# Patient Record
Sex: Male | Born: 1935 | Race: White | Hispanic: No | Smoking: Former smoker
Health system: Southern US, Community
[De-identification: ages and names within clinical notes are randomized; demographics above are authoritative.]

## PROBLEM LIST (undated history)

## (undated) DIAGNOSIS — I2510 Atherosclerotic heart disease of native coronary artery without angina pectoris: Secondary | ICD-10-CM

## (undated) DIAGNOSIS — D696 Thrombocytopenia, unspecified: Secondary | ICD-10-CM

## (undated) DIAGNOSIS — N390 Urinary tract infection, site not specified: Secondary | ICD-10-CM

## (undated) DIAGNOSIS — R6521 Severe sepsis with septic shock: Secondary | ICD-10-CM

## (undated) DIAGNOSIS — N189 Chronic kidney disease, unspecified: Secondary | ICD-10-CM

## (undated) DIAGNOSIS — N1330 Unspecified hydronephrosis: Secondary | ICD-10-CM

## (undated) DIAGNOSIS — I4891 Unspecified atrial fibrillation: Secondary | ICD-10-CM

## (undated) DIAGNOSIS — R55 Syncope and collapse: Secondary | ICD-10-CM

## (undated) DIAGNOSIS — J9690 Respiratory failure, unspecified, unspecified whether with hypoxia or hypercapnia: Secondary | ICD-10-CM

## (undated) DIAGNOSIS — G4730 Sleep apnea, unspecified: Secondary | ICD-10-CM

## (undated) DIAGNOSIS — E119 Type 2 diabetes mellitus without complications: Secondary | ICD-10-CM

## (undated) DIAGNOSIS — I10 Essential (primary) hypertension: Secondary | ICD-10-CM

## (undated) DIAGNOSIS — E782 Mixed hyperlipidemia: Secondary | ICD-10-CM

## (undated) HISTORY — PX: BACK SURGERY: SHX140

## (undated) HISTORY — PX: TONSILLECTOMY: SUR1361

## (undated) HISTORY — DX: Mixed hyperlipidemia: E78.2

## (undated) HISTORY — DX: Atherosclerotic heart disease of native coronary artery without angina pectoris: I25.10

## (undated) HISTORY — DX: Essential (primary) hypertension: I10

## (undated) HISTORY — PX: KNEE ARTHROSCOPY: SUR90

## (undated) HISTORY — PX: HERNIA REPAIR: SHX51

## (undated) HISTORY — PX: CHOLECYSTECTOMY: SHX55

## (undated) HISTORY — DX: Type 2 diabetes mellitus without complications: E11.9

---

## 1990-11-28 HISTORY — PX: CORONARY ARTERY BYPASS GRAFT: SHX141

## 2005-08-16 ENCOUNTER — Ambulatory Visit: Payer: Self-pay | Admitting: Cardiology

## 2005-09-03 ENCOUNTER — Emergency Department (HOSPITAL_COMMUNITY): Admission: EM | Admit: 2005-09-03 | Discharge: 2005-09-03 | Payer: Self-pay | Admitting: Emergency Medicine

## 2005-09-27 ENCOUNTER — Ambulatory Visit: Payer: Self-pay | Admitting: Cardiology

## 2005-09-27 ENCOUNTER — Inpatient Hospital Stay (HOSPITAL_BASED_OUTPATIENT_CLINIC_OR_DEPARTMENT_OTHER): Admission: RE | Admit: 2005-09-27 | Discharge: 2005-09-27 | Payer: Self-pay | Admitting: Cardiology

## 2005-10-14 ENCOUNTER — Ambulatory Visit: Payer: Self-pay | Admitting: Cardiology

## 2006-03-23 ENCOUNTER — Ambulatory Visit: Payer: Self-pay | Admitting: Orthopedic Surgery

## 2006-08-17 ENCOUNTER — Ambulatory Visit: Payer: Self-pay | Admitting: Cardiology

## 2006-09-05 ENCOUNTER — Encounter: Payer: Self-pay | Admitting: Orthopedic Surgery

## 2006-09-05 ENCOUNTER — Inpatient Hospital Stay (HOSPITAL_COMMUNITY): Admission: RE | Admit: 2006-09-05 | Discharge: 2006-09-06 | Payer: Self-pay | Admitting: Neurosurgery

## 2006-10-05 ENCOUNTER — Ambulatory Visit: Payer: Self-pay | Admitting: Orthopedic Surgery

## 2006-10-12 ENCOUNTER — Ambulatory Visit: Payer: Self-pay | Admitting: Orthopedic Surgery

## 2007-03-05 ENCOUNTER — Encounter: Payer: Self-pay | Admitting: Orthopedic Surgery

## 2007-03-05 ENCOUNTER — Encounter: Admission: RE | Admit: 2007-03-05 | Discharge: 2007-03-05 | Payer: Self-pay | Admitting: Neurosurgery

## 2007-03-16 ENCOUNTER — Encounter: Payer: Self-pay | Admitting: Orthopedic Surgery

## 2007-03-16 ENCOUNTER — Inpatient Hospital Stay (HOSPITAL_COMMUNITY): Admission: RE | Admit: 2007-03-16 | Discharge: 2007-03-20 | Payer: Self-pay | Admitting: Neurosurgery

## 2007-03-20 ENCOUNTER — Inpatient Hospital Stay: Admission: AD | Admit: 2007-03-20 | Discharge: 2007-04-03 | Payer: Self-pay | Admitting: Internal Medicine

## 2007-06-05 ENCOUNTER — Ambulatory Visit: Payer: Self-pay | Admitting: Orthopedic Surgery

## 2007-07-16 ENCOUNTER — Ambulatory Visit: Payer: Self-pay | Admitting: Cardiology

## 2007-07-16 ENCOUNTER — Ambulatory Visit: Payer: Self-pay | Admitting: Cardiovascular Disease

## 2007-07-17 ENCOUNTER — Ambulatory Visit (HOSPITAL_COMMUNITY): Admission: RE | Admit: 2007-07-17 | Discharge: 2007-07-17 | Payer: Self-pay | Admitting: Cardiology

## 2007-10-05 ENCOUNTER — Ambulatory Visit: Payer: Self-pay | Admitting: Cardiology

## 2008-06-10 DIAGNOSIS — E118 Type 2 diabetes mellitus with unspecified complications: Secondary | ICD-10-CM

## 2008-06-11 ENCOUNTER — Ambulatory Visit: Payer: Self-pay | Admitting: Orthopedic Surgery

## 2008-06-11 DIAGNOSIS — IMO0002 Reserved for concepts with insufficient information to code with codable children: Secondary | ICD-10-CM | POA: Insufficient documentation

## 2008-06-11 DIAGNOSIS — M23302 Other meniscus derangements, unspecified lateral meniscus, unspecified knee: Secondary | ICD-10-CM | POA: Insufficient documentation

## 2008-06-11 DIAGNOSIS — M25569 Pain in unspecified knee: Secondary | ICD-10-CM

## 2008-06-11 DIAGNOSIS — M1710 Unilateral primary osteoarthritis, unspecified knee: Secondary | ICD-10-CM

## 2008-06-13 ENCOUNTER — Telehealth: Payer: Self-pay | Admitting: Orthopedic Surgery

## 2008-06-18 ENCOUNTER — Ambulatory Visit (HOSPITAL_COMMUNITY): Admission: RE | Admit: 2008-06-18 | Discharge: 2008-06-18 | Payer: Self-pay | Admitting: Orthopedic Surgery

## 2008-06-23 ENCOUNTER — Ambulatory Visit: Payer: Self-pay | Admitting: Orthopedic Surgery

## 2008-06-27 ENCOUNTER — Ambulatory Visit: Payer: Self-pay | Admitting: Orthopedic Surgery

## 2008-06-27 ENCOUNTER — Ambulatory Visit (HOSPITAL_COMMUNITY): Admission: RE | Admit: 2008-06-27 | Discharge: 2008-06-27 | Payer: Self-pay | Admitting: Orthopedic Surgery

## 2008-06-30 ENCOUNTER — Encounter (HOSPITAL_COMMUNITY): Admission: RE | Admit: 2008-06-30 | Discharge: 2008-07-30 | Payer: Self-pay | Admitting: Orthopedic Surgery

## 2008-07-07 ENCOUNTER — Ambulatory Visit: Payer: Self-pay | Admitting: Orthopedic Surgery

## 2008-07-28 ENCOUNTER — Ambulatory Visit: Payer: Self-pay | Admitting: Orthopedic Surgery

## 2008-07-28 DIAGNOSIS — M5430 Sciatica, unspecified side: Secondary | ICD-10-CM | POA: Insufficient documentation

## 2008-09-15 ENCOUNTER — Encounter (HOSPITAL_COMMUNITY): Admission: RE | Admit: 2008-09-15 | Discharge: 2008-10-15 | Payer: Self-pay | Admitting: Podiatry

## 2008-09-30 ENCOUNTER — Ambulatory Visit: Payer: Self-pay | Admitting: Cardiology

## 2008-10-31 ENCOUNTER — Ambulatory Visit: Payer: Self-pay | Admitting: Cardiology

## 2008-10-31 DIAGNOSIS — I482 Chronic atrial fibrillation, unspecified: Secondary | ICD-10-CM

## 2008-10-31 DIAGNOSIS — I2510 Atherosclerotic heart disease of native coronary artery without angina pectoris: Secondary | ICD-10-CM

## 2008-12-10 ENCOUNTER — Encounter: Payer: Self-pay | Admitting: Orthopedic Surgery

## 2009-02-06 ENCOUNTER — Ambulatory Visit: Payer: Self-pay | Admitting: Cardiology

## 2010-02-15 ENCOUNTER — Ambulatory Visit: Payer: Self-pay | Admitting: Orthopedic Surgery

## 2010-02-15 ENCOUNTER — Telehealth: Payer: Self-pay | Admitting: Orthopedic Surgery

## 2010-02-15 DIAGNOSIS — M4800 Spinal stenosis, site unspecified: Secondary | ICD-10-CM

## 2010-02-16 ENCOUNTER — Telehealth: Payer: Self-pay | Admitting: Orthopedic Surgery

## 2010-03-08 ENCOUNTER — Encounter: Payer: Self-pay | Admitting: Orthopedic Surgery

## 2010-03-11 ENCOUNTER — Ambulatory Visit (HOSPITAL_COMMUNITY): Admission: RE | Admit: 2010-03-11 | Discharge: 2010-03-11 | Payer: Self-pay | Admitting: Neurosurgery

## 2010-03-29 ENCOUNTER — Encounter: Payer: Self-pay | Admitting: Orthopedic Surgery

## 2010-04-14 ENCOUNTER — Telehealth (INDEPENDENT_AMBULATORY_CARE_PROVIDER_SITE_OTHER): Payer: Self-pay | Admitting: *Deleted

## 2010-08-18 ENCOUNTER — Encounter: Payer: Self-pay | Admitting: Orthopedic Surgery

## 2010-12-28 NOTE — Op Note (Signed)
Summary: Redo Laminectomy L4-5 by Dr. Venetia Maxon  Redo Laminectomy L4-5 by Dr. Venetia Maxon   Imported By: Jacklynn Ganong 02/23/2010 14:28:59  _____________________________________________________________________  External Attachment:    Type:   Image     Comment:   External Document

## 2010-12-28 NOTE — Consult Note (Signed)
Summary: Consult Report from Dr. Imelda Pillow  Consult Report from Dr. Imelda Pillow   Imported By: Jacklynn Ganong 04/06/2010 09:51:42  _____________________________________________________________________  External Attachment:    Type:   Image     Comment:   External Document

## 2010-12-28 NOTE — Progress Notes (Signed)
   All Cardiac faxed over to [facility]  [facility]  Apr 14, 2010 3:20 PM

## 2010-12-28 NOTE — Assessment & Plan Note (Signed)
Summary: lft leg pain may need xr/medicare/bsf   Visit Type:  Follow-up  CC:  bilateral leg pain.Marland Kitchen  History of Present Illness: 77 year old male status post 2 lumbar surgeries by Dr. Venetia Maxon at [facility] presents now with bilateral anterior thigh pain and inability to walk difficulty getting out of a chair and trouble walking any distance.   review of systems he indicates no bowel or bladder dysfunction at this time  I took some x-rays of his hips and pelvis as noted in the office  The x-rays show a normal hip joints no arthritis normal pelvis  Impression bilateral thigh pain most likely from spinal stenosis  Recommend neurosurgical reevaluation I did give him some Norco for pain follow up as needed  Allergies: 1)  ! * Tylenol #3   Impression & Recommendations:  Problem # 1:  SPINAL STENOSIS (ICD-724.00) Assessment Deteriorated  AP pelvis and bilateral frog-leg laterals of the hips  There is normal joint space on both hips no evidence of arthritis  Impression normal hip joints  Orders: Est. Patient Level III (04540) Pelvis x-ray, 1/2 views (98119) Hip x-ray unilateral complete, minimum 2 views (14782)  Medications Added to Medication List This Visit: 1)  Norco  .... 1 q 4 as needed pain  Other Orders: Neurosurgeon Referral (Neurosurgeon)  Patient Instructions: 1)  Dr. Venetia Maxon did  previous surgery but he request another doctor in the same group  2)  Please schedule a follow-up appointment as needed. Prescriptions: NORCO 1 q 4 as needed pain  #42 x 1   Entered and Authorized by:   Fuller Canada MD   Signed by:   Fuller Canada MD on 02/15/2010   Method used:   Print then Give to Patient   RxID:   [id number]

## 2010-12-28 NOTE — Progress Notes (Signed)
Summary: OK to schedule with Dr. Venetia Maxon  Phone Note Call from Patient   Summary of Call: [name] (2018-05-28) left a message to go ahead and schedule him with Dr. Venetia Maxon.   His # [id number] Initial call taken by: Jacklynn Ganong,  February 15, 2010 1:16 PM

## 2010-12-28 NOTE — Letter (Signed)
Summary: [facility] office notes Dr Leitha Bleak office notes Dr Venetia Maxon   Imported By: Cammie Sickle 03/30/2010 10:09:56  _____________________________________________________________________  External Attachment:    Type:   Image     Comment:   External Document

## 2010-12-28 NOTE — Progress Notes (Signed)
Summary: Referral to Dr. Venetia Maxon.  Phone Note Outgoing Call   Call placed by: Waldon Reining,  February 16, 2010 11:20 AM Call placed to: Specialist Action Taken: Information Sent Summary of Call: I faxed a referral for this patient to be seen by Dr. Venetia Maxon for his leg pain.

## 2010-12-28 NOTE — Medication Information (Signed)
Summary: RX Folder Medication List  RX Folder Medication List   Imported By: Cammie Sickle 02/15/2010 11:11:51  _____________________________________________________________________  External Attachment:    Type:   Image     Comment:   External Document

## 2010-12-28 NOTE — Medication Information (Signed)
Summary: Diabetic shoe Rx  Diabetic shoe Rx   Imported By: Cammie Sickle 02/15/2010 12:30:23  _____________________________________________________________________  External Attachment:    Type:   Image     Comment:   External Document

## 2010-12-28 NOTE — Op Note (Signed)
Summary: Previous  surgery note L4-5 foramniotomy with microdissection by  Previous  surgery note L4-5 foramniotomy with microdissection by Dr. Venetia Maxon   Imported By: Jacklynn Ganong 02/23/2010 14:30:41  _____________________________________________________________________  External Attachment:    Type:   Image     Comment:   External Document

## 2010-12-28 NOTE — Letter (Signed)
Summary: Medical record request fax Dr Eduard Clos  Medical record request fax Dr Eduard Clos   Imported By: Cammie Sickle 08/24/2010 21:24:18  _____________________________________________________________________  External Attachment:    Type:   Image     Comment:   External Document

## 2011-02-16 LAB — CREATININE, SERUM: GFR calc Af Amer: >60 mL/min (ref >60)

## 2011-04-12 NOTE — Assessment & Plan Note (Signed)
[facility]                          [place] [facility] OFFICE NOTE   NAME:Hosang, CHOSEN GESKE                      MRN:          [id number]  DATE:09/30/2008                            DOB:          1918-02-20    PRIMARY CARE PHYSICIAN:  Catalina Pizza, MD.   REASON FOR CONSULTATION:  Dysrhythmia and bradycardia.   HISTORY OF PRESENT ILLNESS:  I last saw Mr. Bluitt in the office back in  November 2008.  He has a history of multivessel cardiovascular disease,  status post coronary artery bypass grafting in 1992 with patent bypass  grafts at angiography in 2006 and preserved left ventricular systolic  function.  He has no history of cardiac dysrhythmia.  Recently, he  underwent a toe amputation at [facility] and was noted  to have a slow irregular heartbeat during perioperative monitoring.  An  electrocardiogram was obtained and reviewed by Dr. Myrtis Ser with concerns  about possible sinus bradycardia versus atrial fibrillation and heart  rate of 49 beats per minute.  It was recommended that the patient stop  his atenolol and see me in the office.  Mr. Panepinto presents today  stating that he has felt fine.  He has not had any progressive  shortness of breath, sense of palpitations, chest pain, or syncope.  He  has stopped his atenolol.  Heart rate in the clinic is ranging between  the 80s to the 90s in what is noted to be atrial fibrillation by a  followup electrocardiogram.  He has a chronic left axis deviation and  right bundle-branch block pattern as well with nonspecific ST-T wave  changes.  Mr. Magallon has a CHADS2 score of 2-3.  He, however, ambulates  with a cane and has poor balance being prone to falls and injury.  Today, we talked about Coumadin, but elected to treat with full-dose  aspirin.  I also suggested that he continue to stay off of the atenolol  and that we would try pindolol instead.   ALLERGIES:  PENICILLIN and TYLENOL #3.   PRESENT MEDICATIONS:  1. Januvia 100 mg p.o. daily.  2. Avapro 75 mg p.o. daily.  3. Metformin 500 mg 2 tablets p.o. q.a.m. and 1 tablet p.o. q.p.m.  4. Aspirin 81 mg p.o. daily.  5. Ocuvite daily.  6. Advil p.r.n.   REVIEW OF SYSTEMS:  As described in the history of present illness.  Otherwise negative.   PHYSICAL EXAMINATION:  VITAL SIGNS:  Blood pressure is 128/79, heart  rate is 80-90 in atrial fibrillation, weight 294 pounds.  GENERAL:  The patient is comfortable in no acute distress.  HEENT:  Conjunctiva is normal.  Oropharynx is clear.  NECK:  Supple.  No elevated jugular venous pressure.  No audible bruits.  No thyromegaly is noted.  LUNGS: Clear without labored breathing at rest.  CARDIAC:  Irregularly irregular rhythm with soft systolic murmur.  No  pericardial rub or S3 gallop.  ABDOMEN:  Soft and nontender.  Normoactive bowel sounds.  EXTREMITIES:  Mild nonpitting edema.  Decreased pulses.  He is wearing a  soft brace on his  foot, status post second toe amputation.   IMPRESSION AND RECOMMENDATIONS:  Recently diagnosed atrial fibrillation,  of uncertain duration.  Mr. Hemp was largely asymptomatic without any  frank sense of palpitations, limiting dyspnea, or chest pain.  He was  rather bradycardic in the perioperative setting, although heart rate has  come up off atenolol.  I would like for him to stay off atenolol.  We  will try pindolol 5 mg p.o. b.i.d.  We did discuss Coumadin with a  CHADS2 score of 2-3, although his fall risk is concerning and we will  therefore suggest enteric-coated aspirin 325 mg daily instead.  I will  plan to see him back over the next month to reassess heart rate control  and symptoms and we can proceed from there.     Jonelle Sidle, MD  Electronically Signed    SGM/MedQ  DD: 09/30/2008  DT: 10/01/2008  Job #: [id number]   cc:   Catalina Pizza, M.D.

## 2011-04-12 NOTE — Assessment & Plan Note (Signed)
[facility]                       [place] [facility] OFFICE NOTE   Kyle, [name] [name]                      MRN:          [id number]  DATE:10/31/2008                            DOB:          12-18-1917    PRIMARY CARE PHYSICIAN:  Catalina Pizza, MD   REASON FOR VISIT:  Routine followup.   HISTORY OF PRESENT ILLNESS:  I saw [name] back in November.  We  adjusted his medications at that time with recently documented atrial  fibrillation associated with slow ventricular response.  He was taken  off of atenolol and started on pindolol 5 mg p.o. b.i.d.  Heart rate is  in the 60s today, and he denies having any problems with dizziness,  syncope, or palpitations.  He has undergone some blood pressure medicine  adjustments and is now on Benicar, but was interested in perhaps taking  a generic medication if possible.  He also states that he increased his  aspirin to 325 mg daily as we recommended, although had some nosebleeds  and has had to cut this back to 81 mg daily.  His CHADS2 score is 2-3.  We have discussed Coumadin, although held off given problems of poor  balance and being prone to falls and injury.  He is not having any  anginal chest pain and his breathing is stable.   ALLERGIES:  PENICILLIN and TYLENOL NO. 3.   PRESENT MEDICATIONS:  1. Pindolol 5 mg p.o. b.i.d.  2. Benicar 20 mg p.o. daily.  3. Januvia 100 mg p.o. daily.  4. Metformin 500 mg p.o. t.i.d.  5. Aspirin 81 mg p.o. daily.  6. Ocuvite daily.  7. Advil p.r.n.  8. Hydrocodone p.r.n.   REVIEW OF SYSTEMS:  As outlined above.  Otherwise negative.   PHYSICAL EXAMINATION:  VITAL SIGNS:  Blood pressure is 140/70, heart  rate is 68 and irregular, and weight is 195 pounds.  GENERAL:  The patient is in no acute distress.  NECK:  No elevated jugular venous pressure.  No loud bruits.  No  thyromegaly is noted.  LUNGS: Clear without labored breathing at rest.  Diminished breath  sounds noted.  CARDIAC:  Regularly irregular rhythm.  No S3 gallop.  Soft systolic  murmur noted at the base.  EXTREMITIES:  Exhibit mild nonpitting edema.   IMPRESSION AND RECOMMENDATIONS:  1. Atrial fibrillation with reasonable rate control and no sense of      palpitations.  He seems to be tolerating pindolol better than      atenolol from the perspective of previously documented bradycardia,      and we will plan to continue on aspirin 81 mg daily given his      nosebleeds on higher dose.  We have kept him off Coumadin given his      balance problems and risk for falls.  I will plan to see him back      over the next 3 months.  2. Hypertension, undergoing medicine adjustments by Dr. Margo [name].  Mr.      [name] is interested in taking a generic if possible.  He is due to  see Dr. Margo [name] back for followup and he could be considered for a      generic ACE inhibitor assuming he tolerates the Benicar.  3. Multivessel coronary artery disease with preserved ejection      fraction status post coronary artery bypass grafting in 1992.      Grafts were patent and angiography in 2006, and he is not reporting      any angina.     Jonelle Sidle, MD  Electronically Signed    SGM/MedQ  DD: 10/31/2008  DT: 11/01/2008  Job #: [id number]   cc:   Catalina Pizza, M.D.

## 2011-04-12 NOTE — Procedures (Signed)
NAMETRAVANTE, KNEE NO.:  [id number]   MEDICAL RECORD NO.:  [id number]          PATIENT TYPE:  OUT   LOCATION:  RAD                           FACILITY:  [facility]   PHYSICIAN:  Noralyn Pick. Eden Emms, MD, FACCDATE OF BIRTH:  08-19-1918   DATE OF PROCEDURE:  07/17/2007  DATE OF DISCHARGE:                                ECHOCARDIOGRAM   REASON FOR VISIT:  Check LV function, history of hypertension,  CABG.   Left ventricular cavity size was normal. There was no regional wall  motion abnormality.  There was abnormal septal motion. EF was 60%.  There was mild LVH.  There was moderate left atrial enlargement.  Right-  sided cardiac chambers were normal. There was evidence of mild elevation  in PA systolic pressure.  There was mild TR.  The aortic valve was  slightly sclerotic.  Aortic root was normal.  Mitral valve was mildly  thickened with mild MR and posterior annular calcification.  Subcostal  revealed no septal defect, no source of embolus, no effusion.   M-Mode measurements showed aortic dimension of 4 cm. Left atrial  dimension 56 mm.  Septal thickness 14 mm.  LV diastolic dimension 54 mm.  LV systolic dimension 32 mm.  Peak TR velocity was 3.2 meters per  second.   IMPRESSION:  1. Mild left ventricular hypertrophy,  ejection fraction 60% with      abnormal septal motion.  2. Moderate left atrial enlargement with mild right atrial      enlargement.  3. Mild mitral regurgitation with mitral annular calcification.  4. Aortic valve stenosis.  5. Normal aortic root.  6. No source of embolus.  7. No peripheral effusion.      Noralyn Pick. Eden Emms, MD, [facility]  Electronically Signed     PCN/MEDQ  D:  07/17/2007  T:  07/18/2007  Job:  [id number]   cc:   Jonelle Sidle, MD  [address]  [place], [place] [address]

## 2011-04-12 NOTE — Assessment & Plan Note (Signed)
[place] [facility]                            CARDIOLOGY OFFICE NOTE   NAME:[name], [name]                      MRN:          [id number]  DATE:07/16/2007                            DOB:          May 11, 1918    PRIMARY CARE PHYSICIAN:  Dr. Margo Aye in [place].   REASON FOR VISIT:  Cardiac followup.   HISTORY OF PRESENT ILLNESS:  I saw Mr. Pantano back in September of last  year. He has a history of multi vessel coronary artery disease, status  post coronary artery bypass grafting in 1992. He also has fairly  recently documented patent bypass grafts on angiography in October of  2006 and overall normal left ventricular systolic function. He had a  surgical decompression with redo laminectomy at L4, L5 back in April of  this year. He is not reporting any significant angina. There has been a  question of recent fluid gain however with lower extremity edema and  increased weight. He was placed on Lasix by Dr. Margo Aye and has had a  fairly nice weight loss at least since I have seen him greater than 10  pounds. His electrocardiogram is stable showing a right bundle branch  block pattern and leftward axis.   ALLERGIES:  TYLENOL #3.   PRESENT MEDICATIONS:  1. Relafen 500 mg p.o. b.i.d.  2. Glucophage 500 mg as directed.  3. Atenolol 25 mg p.o. daily.  4. Aspirin 81 mg p.o. daily.  5. Trental 400 mg p.o. daily.  6. Ocuvite.  7. Imipramine 10 mg p.o. daily.  8. Potassium chloride 20 mEq p.o. daily.  9. Lasix 20 mg p.o. daily.   REVIEW OF SYSTEMS:  As described in history of present illness.   PHYSICAL EXAMINATION:  Blood pressure 176/77, heart rate is 61, weight  is 193 pounds down from 212. Elderly male in no acute distress.  HEENT: Conjunctivae normal.  Oropharynx is clear.  NECK: Supple, no elevated jugular venous pressure, or loud bruits. No  thyromegaly is noted.  LUNGS: Clear without labored breathing at rest.  CARDIAC EXAM: Reveals a regular rate and  rhythm. Soft systolic murmur.  No S3 gallop.  EXTREMITIES: Exhibit 1 to 2 + pitting edema around the ankles at the  sock line.   IMPRESSION/RECOMMENDATIONS:  1. Lower extremity edema. This has improved somewhat with Lasix.      Ejection fraction was previously graded at normal based on      assessment in 2006. We will plan a follow up 2D echocardiogram to      reassess this. In the meanwhile I asked him to continue his medical      therapy including Lasix and potassium supplementation. I will see      him back for symptom review in 3 months.  2. Multi vessel coronary artery disease, status post coronary artery      bypass grafting in 1992 with patent bypass grafts at angiography in      2006.     Jonelle Sidle, MD  Electronically Signed    SGM/MedQ  DD: 07/16/2007  DT: 07/16/2007  Job #: [id number]  cc:   Catalina Pizza, M.D.

## 2011-04-12 NOTE — Assessment & Plan Note (Signed)
[place] [facility]                            CARDIOLOGY OFFICE NOTE   NAME:[name], [name]                      MRN:          [id number]  DATE:10/05/2007                            DOB:          May 08, 1918    PRIMARY CARE PHYSICIAN:  Dr. Catalina [name].   REASON FOR VISIT:  Cardiac followup.   HISTORY OF PRESENT ILLNESS:  [name] comes in for a 27-month visit.  He is not reporting any significant angina and states that the previous  lower extremity edema has improved.  He, in fact, is not taking  diuretics at this time.  I did send him for a followup echocardiogram  back in August which revealed a normal left ventricular ejection  fraction of 60%, moderate left atrial enlargement, mild mitral  regurgitation and aortic valve sclerosis.  I reviewed his medications  today.  His electrocardiogram shows sinus rhythm with a prolonged PR  interval and right bundle branch block which is old.   ALLERGIES:  TYLENOL #3.   PRESENT MEDICATIONS:  1. Atenolol 25 mg p.o. daily.  2. Aspirin 81 mg p.o. daily.  3. Ocuvite.  4. Januvia.  5. Advil.  6. Glucophage 500 mg, two tablets p.o. q.a.m. and one tablet p.o.      q.p.m.  7. Diovan 80 mg p.o. daily.   REVIEW OF SYSTEMS:  As described in the History of Present Illness.   EXAMINATION:  Blood pressure today is 114/64, heart rate is 55, weight  is 197 pounds which is stable.  The patient is comfortable, in no acute  distress.  HEENT:  Conjunctiva is normal.  Oropharynx is clear.  NECK:  Supple.  No elevated jugular venous pressure, no loud bruits, no  thyromegaly is noted.  LUNGS:  Clear without labored breathing.  CARDIAC EXAM:  Reveals a regular rate and rhythm without S3 gallop.  EXTREMITIES:  Exhibit trace edema.   IMPRESSION AND RECOMMENDATION:  1. Multivessel coronary artery disease with preserved ejection      fraction status post coronary artery bypass grafting in 1992.      Patent bypass grafts were  noted at angiography in 2006 and the      patient is not reporting any angina at this time.  Plan will be to      continue medical therapy and I will see him back in 6 months in our      [place] office.  2. Prior lower extremity edema has improved.  Will hold off diuretics      for the time being.  3. Otherwise, follow up with Dr. Margo [name] for management of diabetes and      lipids.  The patient is not on a statin at this time.  He should      have aggressive LDL control around 70 if possible.     Jonelle Sidle, MD  Electronically Signed    SGM/MedQ  DD: 10/05/2007  DT: 10/05/2007  Job #: [id number]   cc:   [name], M.D.

## 2011-04-12 NOTE — Op Note (Signed)
NAMEKIEL, COCKERELL NO.:  [id number]   MEDICAL RECORD NO.:  [id number]          PATIENT TYPE:  AMB   LOCATION:  DAY                           FACILITY:  [facility]   PHYSICIAN:  Vickki Hearing, M.D.DATE OF BIRTH:  [birth year]   DATE OF PROCEDURE:  06/27/2008  DATE OF DISCHARGE:                               OPERATIVE REPORT   HISTORY:  This is an 77 year old male with 2-year history of pain in his  right knee and giving out episodes.  MRI showed a large complex tear of  posterior horn and body of the medial meniscus with grade IV arthritic  changes on his patella, severe medial compartment arthritis, Baker cyst,  and loose body medial recess.  He was treated conservatively with  injections, medication Advil and oral pain medicine, did not improve,  presented for arthroscopy of the right knee.   PREOPERATIVE DIAGNOSIS:  Torn medial meniscus of the right knee with  severe arthritis, possible loose body.   POSTOPERATIVE DIAGNOSES:  Torn medial meniscus and severe arthritis,  right knee.   PROCEDURES:  Arthroscopy, right knee and partial medial meniscectomy.   SURGEON:  Vickki Hearing, MD   ASSISTANT:  There were no assistants.   ANESTHETIC:  Spinal.   FINDINGS:  Complex tear posterior horn medial meniscus, grade IV changes  tibial and femoral surfaces of the medial compartment, synovitis  throughout the knee, grade II and III changes in the patellofemoral area  and lateral compartment grade II changes.  No loose body was found.   SPECIMENS:  There were no specimens.   ESTIMATED BLOOD LOSS:  Minimal.   COMPLICATIONS:  None.   Counts were correct.   DESCRIPTION OF PROCEDURE:  The patient was identified in preop area as  [name].  Surgical site was marked.  Surgeons and the patient's  initial history and physical was updated.  Chart review was done.  Antibiotics were given and he was taken to surgery, had a spinal  anesthetic.  He was placed  supine with a right leg holder and padding on  the left leg after prepping with DuraPrep and draping sterilely.  Time-  out procedure was completed.   Lateral portal was established.  Diagnostic arthroscopy was performed.  The findings are listed above.  A medial portal was established.  A  medial meniscus was probed.  On MRI, there was said to be a displaced  fragment below the joint line.  This was sought, but not found.  A  straight shaver was used with a 50-degree ArthroCare wand to resect and  balance the meniscus.  Brief chondroplasty was done on the femoral side.  Slight chondroplasty was done on the lateral side just to clean up  debris.  Knee was irrigated copious amounts of arthroscopic fluid and  then injected with Marcaine 0.25% with epinephrine.  Steri-Strips  applied to the wounds.  Dressings then applied with Ace wrap and Cryo  Cuff.  The patient was taken to recovery room in stable condition.   POSTOP PLAN:  For full weightbearing as tolerated, physical therapy to  start  next week.  Followup appointment is July 07, 2008, at 9:30 a.m.   DISCHARGE MEDICATIONS:  Lorcet Plus 1 q.4 h. p.r.n. for pain #40, one  refill and Phenergan 25 mg p.o. q.6 h. p.r.n. for nausea #30, no refill.      Vickki Hearing, M.D.  Electronically Signed     SEH/MEDQ  D:  06/27/2008  T:  06/27/2008  Job:  [id number]

## 2011-04-12 NOTE — Assessment & Plan Note (Signed)
[facility]                        CARDIOLOGY OFFICE NOTE   NAME:TUTTLEAureliano, Oshields                      MRN:          [id number]  DATE:02/06/2009                            DOB:          10-Sep-1918    PRIMARY CARE PHYSICIAN:  Dr. Catalina Pizza, MD.   REASON FOR VISIT:  Scheduled followup.   HISTORY OF PRESENT ILLNESS:  Mr. Copes presents for a regular visit.  He is not reporting any significant palpitations.  He has had no  dizziness or syncope.  He does state that he was diagnosed with a fairly  significant peripheral neuropathy since we last met, likely explaining a  lot of his unsteadiness and being prone to falls.  We have kept him off  Coumadin for this reason even though his CHADS2 score is 2-3.  Heart  rate control looks better, since we have switched him to pindolol.  He  had labs done back in January which revealed total cholesterol of 140,  HDL of 39, LDL of 80, and triglycerides of 106.   ALLERGIES:  TYLENOL #3 and PENICILLIN.   PRESENT MEDICATIONS:  1. Aspirin 81 mg p.o. daily.  2. Ocuvite daily.  3. Januvia 100 mg p.o. daily.  4. Metformin 1000 mg p.o. q.a.m. and 500 mg p.o. q.p.m.  5. Pindolol 5 mg p.o. b.i.d.  6. Benicar 20 mg p.o. daily.  7. Vitamin B12 daily.   REVIEW OF SYSTEMS:  As outlined above.  Otherwise negative.   PHYSICAL EXAMINATION:  VITAL SIGNS:  Blood pressure 110/68, heart rate  is 66 and irregular, weight is 196 pounds just stable.  GENERAL:  He is an overweight male in no acute distress.  NECK:  No elevated jugular venous pressure.  No loud carotid bruits.  No  thyromegaly.  LUNGS:  Clear without labored breathing at rest.  CARDIAC:  An irregularly irregular rhythm.  No S3 gallop.  Soft basal  systolic murmur.  No pericardial rub.  EXTREMITIES:  Exhibit mild nonpitting edema.   IMPRESSION/RECOMMENDATIONS:  1. Chronic atrial fibrillation, on aspirin and pindolol.  He has been      stable in this  regard with no dizziness, syncope, or major      palpitations.  He has not been on Coumadin given increased fall      risk.  CHADS2 score is 2-3.  2. Multivessel cardiovascular disease with preserved ejection      fraction, status post coronary bypass grafting in 1992.  Grafts are      patent angiography in 2006.  He is not reporting any angina.  3. Hypertension, much better controlled.  4. Hyperlipidemia, with LDL of 80 in January.     Jonelle Sidle, MD  Electronically Signed    SGM/MedQ  DD: 02/06/2009  DT: 02/07/2009  Job #: [id number]   cc:   Catalina Pizza, M.D.

## 2011-04-12 NOTE — Assessment & Plan Note (Signed)
[facility]                          [place] CARDIOLOGY OFFICE NOTE   NAME:[name], Kyle ISHLER                      MRN:          [id number]  DATE:09/24/2008                            DOB:          [birth year]    [name] had a toe amputated today at [facility].  There was no  preop EKG.  During his monitoring for the procedure, it was noted that  his heartbeat was relatively slow and somewhat irregular.  EKG was done  and has been faxed to me.  It is difficult to fully assess.  He has a  history of right bundle-branch block and first-degree AV block.  It is  possible that this could be sinus bradycardia.  It is possible that it  could be atrial fibrillation.  The patient is not having any significant  symptoms.  He has a history of an old right bundle-branch block.   I have reviewed his records by e-chart, and he has history of good LV  function.   I have recommended that his atenolol 25 mg be held.  He will be allowed  to go home from short-stay, and our office will call him for early  followup in the office to address this issue.  He is clinically stable  as reported to me.     Luis Abed, MD, [facility]  Electronically Signed    JDK/MedQ  DD: 09/24/2008  DT: 09/25/2008  Job #: [id number]

## 2011-04-15 NOTE — H&P (Signed)
[name], [name]:  [id number]   MEDICAL RECORD NO.:  [id number]          PATIENT TYPE:  OIB   LOCATION:  1963                         FACILITY:  [facility]   PHYSICIAN:  [name], M.D. [facility] DATE OF BIRTH:  06-27-1918   DATE OF ADMISSION:  09/27/2005  DATE OF DISCHARGE:                                HISTORY & PHYSICAL   PRIMARY CARDIOLOGIST:  Dr. Simona [name]   PRIMARY CARE PHYSICIAN:  Dr. Nena [name]   CHIEF COMPLAINT:  Shortness of breath/dyspnea on exertion.   HISTORY OF PRESENT ILLNESS:  [name] is an 77 year old male with a  history of coronary artery disease who had bypass surgery in 1992.  He  transferred from the [place] to the [place] office in 2003 and had a  Cardiolite at that time which showed a fixed defect but no ischemia and an  EF of 57%.  He saw Dr. Raul [name] and described progressive exertional shortness  of breath and some chest tightness.  An echocardiogram was performed through  Dr. Raul [name] which showed an EF of 50%.  He was evaluated by Dr. Diona [name] and  the situation was discussed with Dr. Juanda [name].  It was recommended that he  have a cardiac catheterization and he is here today for the procedure.   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  He is generally active around the house although he does not  formally exercise.  He has had no chest tightness recently.  He is symptom  free at the time of exam.   PAST MEDICAL HISTORY:  1.  Status post aortocoronary bypass surgery in 1992 with LIMA to diagonal      and LAD, SVG to OM and SVG to RCA.  2.  Hypertension.  3.  Diabetes.  4.  Diabetic neuropathy.  5.  Osteoarthritis.  6.  Hypertension.  7.  Hyperlipidemia.   PAST SURGICAL HISTORY:  Aortocoronary bypass surgery and cardiac  catheterization.   ALLERGIES:  No known drug allergies.   CURRENT MEDICATIONS  INCLUDE:  1.  Coated aspirin 325 mg one half tablet daily.  2.  Lasix 40 mg a day.  3.  Relafen 500 mg twice daily.  4.  Atenolol 25 mg daily.  5.  Glucophage 500 mg twice daily.  6.  Trental 400 mg twice daily.  7.  Vicodin 5/500 one half tablet daily.   SOCIAL HISTORY:  He lives in [place] with his wife.  He has a 22 pack-year  history of tobacco use but quit sometime in the 1960s.  He is a retired   [employer] and also had a Community education officer job.   FAMILY HISTORY:  His mother died at age 68 of old age.  His father died at  age 22 of a cerebral hemorrhage and his brother died at age 18 of an MI.   REVIEW OF SYSTEMS:  He denies orthopnea or paroxysmal nocturnal dyspnea.  He  has had no hematemesis,  hemoptysis, or melena.  He has pain in his right  foot and there is an area of ecchymosis and this is a new finding although  because of the neuropathy he has chronic pain issues in his lower  extremities.  Review of systems is otherwise negative.  Hard of hearing,  wearing hearing aids.   PHYSICAL EXAMINATION:  GENERAL:  He is a well-developed elderly white male  in no acute distress.  HEENT:  His head is normocephalic with pupils equal, round, and reactive to  light and accommodation.  Extraocular movements intact.  Sclerae clear.  Nares without discharge.  Bilateral hearing aids are in place.  NECK:  There is no JVD, no thyromegaly and no carotid bruits are  appreciated.  CV:  His heart is regular in rate and rhythm with an S1-S2 and no  significant murmur, rub, or gallop is noted.  ABDOMEN:  Soft, nontender, with active bowel sounds and no  hepatosplenomegaly by palpation.  EXTREMITIES:  There is no cyanosis, clubbing, or edema and distal pulses are  intact in all four extremities although the left DP is diminished.  No  femoral bruits are appreciated.  MUSCULOSKELETAL:  There is no joint deformity or effusions and no spine or  CVA tenderness.  NEUROLOGICAL:  He is  alert and oriented with cranial nerves II-XII grossly  intact.   ASSESSMENT AND PLAN:  Dyspnea on exertion/chest tightness.  His symptoms are  possibly an anginal equivalent and [name] is here today for cardiac  catheterization to further assess his anatomy.  He is to be followed by Dr.  Diona [name] for cardiology and follow up with Dr. Raul [name] for general medical  care and management of his diabetes and hypertension.      [name], P.A. [facility]    ______________________________  [name], M.D. [facility]    RB/MEDQ  D:  09/27/2005  T:  09/27/2005  Job:  [id number]   cc:   [facility]  [place], [place]   Dr. Wynonia [name], [place]

## 2011-04-15 NOTE — Discharge Summary (Signed)
NAMEHAYDEN, [name]:  [id number]   MEDICAL RECORD NO.:  [id number]          PATIENT TYPE:  INP   LOCATION:  3040                         FACILITY:  [facility]   PHYSICIAN:  Stefani Dama, M.D.  DATE OF BIRTH:  10-24-1918   DATE OF ADMISSION:  03/16/2007  DATE OF DISCHARGE:  03/20/2007                               DISCHARGE SUMMARY   ADMISSION DIAGNOSES:  1. Spondylosis, L4/L5.  2. Spondylolisthesis with severe stenosis and lumbar radiculopathy,      right L4/L5 nerve roots.   FINAL DIAGNOSES:  1. Spondylosis, L4/L5.  2. Spondylolisthesis L4/L5 with severe stenosis and lumbar      radiculopathy L4/L5 nerve roots.  3. Acute blood loss anemia.  4. Advanced age.  5. Coronary artery disease.  6. Diabetes mellitus.   CONDITION ON DISCHARGE:  Stable.   HOSPITAL COURSE:  [name] is an 77 year old individual who  was evaluated by Dr. Eliberto Ivory back in September of 2007.  At that time he  had been complaining of severe back and right lower extremity pain.  He  was found to have spondylytic disease with spondylolisthesis at L4/L5.  He was treated conservatively at first.  However, after a period of  months he was noted to be getting no better, he was having worsening  pain and weakness in his right lower extremity particularly, and was  advised regarding surgical decompression.  He was taken to the operating  room on 03/16/2007 where he underwent re-do laminectomy at L4/L5,  diskectomy, posterior lumbar interbody fusion with peak cages,  autograft, allograft, osteal cell supplementation, pedicle screw  fixation of L4/L5 and posterolateral arthrodesis.  He tolerated this  procedure well.  He was subsequently ambulated with the use of physical  therapy.  His diabetes remained under good control.  Blood loss was  substantial and he had some acute blood loss anemia.  He was able to be  mobilized with the help of physical therapy and currently is walking  with the help of one person using a walker.  He still has some moderate  weakness in the tibialis anterior group on the right side.  His  incision, however, is clean and dry.  He will be receiving some physical  therapy at the [facility] where he will be discharged to from this  acute hospitalization.   DISCHARGE MEDICATIONS:  Include:  1. Docusate sodium 100 mg b.i.d.  2. Glucophage 1000 mg daily.  3. Atenolol 25 mg daily.  4. Glucophage 500 mg q.h.s.  5. Trental 400 mg daily.  6. Tofranil 10 mg q.h.s.  7. Baby aspirin 81 mg q.h.s.  8. Multivitamin daily.  9. Diazepam 5 mg q. 6h p.r.n. muscle spasm.  10.Ambien 5 mg q.h.s. p.r.n. sleep.  11.Tylox 1 p.o. q. 3h p.r.n. pain.      Stefani Dama, M.D.  Electronically Signed     HJE/MEDQ  D:  03/20/2007  T:  03/20/2007  Job:  [id number]

## 2011-04-15 NOTE — Op Note (Signed)
NAMESANJUAN, [name]:  [id number]   MEDICAL RECORD NO.:  [id number]          PATIENT TYPE:  INP   LOCATION:  3172                         FACILITY:  [facility]   PHYSICIAN:  Danae Orleans. Venetia Maxon, M.D.  DATE OF BIRTH:  Mar 31, 1918   DATE OF PROCEDURE:  03/16/2007  DATE OF DISCHARGE:                               OPERATIVE REPORT   PREOPERATIVE DIAGNOSIS:  Spondylolisthesis, herniated disc, stenosis,  and radiculopathy L4-L5.   POSTOPERATIVE DIAGNOSIS:  Spondylolisthesis, herniated disc, stenosis,  and radiculopathy L4-L5.   PROCEDURE:  1. Redo laminectomy L4-L5.  2. Discectomy L4-L5.  3. Posterior lumbar interbody fusion with PEEK cages, autograft, and      Osteocel.  4. Pedicle screw fixation L4 through L5 bilaterally.  5. Posterolateral arthrodesis.   SURGEON:  Danae Orleans. Venetia Maxon, M.D.   ASSISTANT:  Stefani Dama, M.D.   ANESTHESIA:  General endotracheal anesthesia.   ESTIMATED BLOOD LOSS:  1200 mL with 500 mL Cell Saver blood returned to  the patient.   COMPLICATIONS:  None.   DISPOSITION:  To recovery.   INDICATIONS:  [name] is an 77 year old man with severe right leg  pain.  He had previously undergone microdiscectomy at L4-L5 on the right  but subsequently developed spondylolisthesis and significant foraminal  stenosis with a laterally based herniated disc at L4-L5 on the right  which was causing right L4 nerve root compression.  It was elected to  take him to surgery for redo laminectomy and fusion at this level.   DESCRIPTION OF PROCEDURE:  Mr. Gorum is brought to the operating room.  Following the satisfactory and uncomplicated induction of general  endotracheal anesthesia and placement of intravenous lines, the patient  was placed in a prone position on the [place] table.  A Foley catheter  had been placed prior to turning the patient.  His low back was then  shaved, prepped and draped in the usual sterile fashion.  The area of  planned incision was infiltrated with 0.25% Marcaine and 0.5% lidocaine  with 1:200,000 epinephrine.  An incision was made in the midline and  carried through the previous incision to the subcutaneous tissues and  then bilateral subperiosteal dissection was performed exposing the L4-L5  level and bilateral L4 and L5 transverse processes.  Intraoperative x-  ray confirmed the correct level.  There was evidence of a very slow CSF  leak which had created a small pseudomeningocele and was dissected free  of investing scar tissue, and eventually the dura was exposed and this  was found to be a secondary to very small dural opening which was closed  with 6-0 Prolene with complete cessation of any CSF leak.   Bilateral facetectomies of L4 and laminectomy of L4 was performed with a  high speed drill.  The central spinous process was also removed. On the  right side, the L4 nerve root appeared to be significantly compressed  with ligamentous tissue and there was laterally herniated disc material.  The ligamentous tissue was removed resulting in decompression of the  nerve root and discectomies were performed bilaterally at L4-L5 using  a  15 blade and disc material was removed in a piecemeal fashion.  A large  spondylitic spur at the inferior aspect of L4 was removed with  decompression of the L4 nerve root.  Subsequently, the interspace was  aggressively stripped of residual disc material and cartilaginous  material and after using a variety of pituitary rongeurs, ring, cup  curets, and reamers, the endplates were stripped of residual disc  material.  After trial sizing, an 11 mm PEEK interbody cage was  selected, packed with morcellized bone autograft with 5 mL Osteocel, and  tamped into position.  This was done on the right side, as well.  Subsequently, after confirming position of the cages on AP and lateral  fluoroscopy, additional bone graft was placed in the interspace.  The  bone graft was  placed deep to the cages and also posterior to the cages  and lateral to the cages and also medial the cages.  About 25 mL of bone  graft was placed in the interspace.   Subsequently, the pedicle screw fixation was placed with 45 mm x 6.5 mm  screws at L5 and 50 mm x 6.5 mm screws at L4. All screws had excellent  purchase.  30 mm rods were placed overlying the screws and were locked  down in situ.  Their position was confirmed on AP and lateral  fluoroscopy.  There were no cutouts appreciated.  The remaining bone  graft was then placed in the decorticated posterolateral region on the  left side of midline at L4 through L5 level.  Hemostasis was assured.  The soft tissues were inspected and found to be in good repair.  Valsalva maneuver did not demonstrate any leakage of CSF.  The self  retaining tractor was removed.  The lumbodorsal fascia was closed with  #1 Vicryl sutures, subcutaneous tissues were reapproximated with 2-0  Vicryl interrupted inverted sutures, and skin edges were approximated  with interrupted 3-0 Vicryl subcuticular stitch.  The wound was dressed  with Benzoin, Steri-Strips, Telfa gauze and tape.  The patient was  extubated in the operating room and taken to the recovery room in stable  satisfactory condition having tolerated the operation well.  Counts were  correct at the end of the case.      Danae Orleans. Venetia Maxon, M.D.  Electronically Signed     JDS/MEDQ  D:  03/16/2007  T:  03/17/2007  Job:  [id number]

## 2011-04-15 NOTE — Assessment & Plan Note (Signed)
HEALTHCARE                              CARDIOLOGY OFFICE NOTE   NAME:[name], [name]                      MRN:          [id number]  DATE:08/17/2006                            DOB:          01-28-1918    PRIMARY CARE PHYSICIAN:  Dr. Weyman Pedro.   SURGEON:  Dr. Maeola Harman.   REASON FOR VISIT:  Preoperative evaluation.   HISTORY OF PRESENT ILLNESS:  I last saw [name] back in November of 2006.  He is a pleasant 77 year old gentleman with multivessel coronary artery  disease, status post previous coronary artery bypass grafting by Dr.  Tyrone Sage in 1992.  He actually underwent a diagnostic cardiac  catheterization by Dr. Juanda Chance in last October 2006, which demonstrated  patent coronary artery bypass grafts.  He has been managed medically and  continues to deny any problems with exertional angina or limited dyspnea on  exertion.  His main complaints are of right thigh discomfort, and he states  that he has had corticosteroid injections for lumbar spine disease.  He is  being considered for L4-L5 surgery, which has not yet been scheduled, but is  referred for a preoperative cardiac assessment.   His electrocardiogram shows sinus bradycardia with an old right bundle  branch block pattern.  He had a chest CT scan an followup by Dr. Eliberto Ivory back  in June, which demonstrated stability of a subpleural nodular density seen  in both lung fields, also associated with mild centrilobular emphysema.   ALLERGIES:  TYLENOL #3.   PRESENT MEDICATIONS:  1. Relafen 100 mg p.o. b.i.d.  2. Trental 400 mg p.o. b.i.d.  3. Glucophage 500 mg two tablets p.o. daily.  4. Imipramine 10 mg p.o. b.i.d.  5. Atenolol 25 mg p.o. daily.  6. Aspirin 81 mg p.o. daily.  7. Septra, prescribed for a recent left arm cellulitis.   REVIEW OF SYSTEMS:  As described in his present illness.  The patient states  that he was recently hospitalized with a left forearm cellulitis.  He  states  that he did not require any debridement or drainage, and is being treated  with outpatient antibiotics under the direction of Dr. Eliberto Ivory.  He has not  had any palpitations, lower extremity edema or syncope.   PHYSICAL EXAMINATION:  Blood pressure is 150/72, heart rate is 62, weight is  212 pounds.  The patient is comfortable, and in no acute distress.  NECK:  Examination of the neck reveals no elevated jugular venous pressure  or loud bruits, no thyromegaly is noted.  LUNGS:  Clear without wheezing or labored breathing.  Breath sounds are  somewhat diminished.  CARDIAC:  Exam reveals a regular rate and rhythm with very soft systolic  murmur, preserved S2 and no S3 gallop.  ABDOMEN:  Soft with normoactive bowel sounds, no hepatomegaly.  EXTREMITIES:  Exhibit trace edema.   IMPRESSION AND RECOMMENDATIONS:  1. Multivessel coronary artery disease, status post previous coronary      artery bypass grafting in 2002 with documented patent bypass grafts at      cardiac catheterization in October of  2006.  [name] is not      reporting any angina or limiting dyspnea on exertion.  In light of the      fact that he had a cardiac catheterization within the year, I would not      anticipate that he needs any additional testing at this point.  The      electrocardiogram is stable.  He should be able to proceed with planned      surgery if he elects to do so, with an acceptable cardiovascular risk.      I would otherwise like to see him back over the next six months.  2. Reported left arm cellulitis, followed by Dr. Eliberto Ivory on outpatient      antibiotics.  3. Type 2 diabetes mellitus, followed by Dr. Eliberto Ivory.                                Jonelle Sidle, MD    SGM/MedQ  DD:  08/17/2006  DT:  08/18/2006  Job #:  [id number]   cc:   Jerrel Ivory. Venetia Maxon, M.D.

## 2011-04-15 NOTE — Op Note (Signed)
NAMESWAN, ZAYED NO.:  [id number]   MEDICAL RECORD NO.:  [id number]          PATIENT TYPE:  INP   LOCATION:  2899                         FACILITY:  [facility]   PHYSICIAN:  Danae Orleans. Venetia Maxon, M.D.  DATE OF BIRTH:  1918/10/07   DATE OF PROCEDURE:  09/05/2006  DATE OF DISCHARGE:                                 OPERATIVE REPORT   PREOPERATIVE DIAGNOSES:  Stenosis, spondylolisthesis and spondylosis with  lumbar radiculopathy at L4-L5 right.   POSTOPERATIVE DIAGNOSES:  Stenosis, spondylolisthesis and spondylosis with  lumbar radiculopathy at L4-5 right.   PROCEDURE:  Right L4-5 foraminotomy with microdissection.   SURGEON:  Danae Orleans. Venetia Maxon, M.D.   ASSISTANTStark Jock, RN and Lovell Sheehan, MD   ANESTHESIA:  General endotracheal anesthesia.   ESTIMATED BLOOD LOSS:  Minimal.   COMPLICATIONS:  None.   DISPOSITION:  To recovery.   INDICATIONS:  [name] is an 77 year old man with severe right leg pain  with a right L4-5 foraminal stenosis and spondylosis with grade 1  spondylolisthesis of L4 and L5.  We had elected to take him to surgery for  decompression and foraminotomy.   PROCEDURE:  [name] is brought to the operating room.  He is placed in a  prone position on the operating room table after satisfactory and  uncomplicated induction of general endotracheal anesthesia and placement of  intravenous lines.  His low back was then shaved, prepped and draped in the  usual sterile fashion.  The area of planned incision was infiltrated with  0.25% Marcaine, 0.5%  lidocaine, 1:200,000 epinephrine.  An incision was  made in the midline, carried through copious adipose tissue to the  lumbodorsal fascia and then incised in the right side of midline exposing  the L4-5 interspace.  A marker probe was placed and intraoperative x-ray  confirmed correct orientation.  Subsequently a hemisemilaminectomy of L4 was  performed along with the superior aspect of the L5 lamina  as well as the  lateral recess.  The ligamentous tissue was highly hypertrophied, it was  removed in a piecemeal fashion with a variety of 3 and 4 mm Kerrison  rongeurs.  Microscope is brought into the field and using microdissection  technique, the thecal sac was mobilized medially.  A generous foraminotomy  is performed overlying the L5 nerve root and also the lateral recess was  decompressed of residual ligamentous tissue using a variety of angled curets  and Kerrison Rongeurs.  Coronary dilator probe was easily inserted out the  neural foramen at the level of the L4 nerve root and along the L5 nerve root  and the lateral recess appeared to be well decompressed.  Hemostasis was  assured.  There is significant facet arthropathy and hypertrophied material  from the facet joint was removed but the facet itself was not disrupted.  After hemostasis was assured, the wound was irrigated and then 80 mg of Depo-  Medrol and 1 mL of fentanyl was placed in the operative bed.  The self-  retaining retractor was removed.  The lumbar fascia was closed with 0 Vicryl  sutures.  Subcutaneous tissues are reapproximated with 2-0 Vicryl  interrupted inverted sutures and skin edges reapproximated with interrupted  3-0 Vicryl subcuticular stitch.  The wound was dressed with Dermabond.  The  patient is extubated in the operating room, taken to the recovery room in  stable satisfactory condition having tolerated the operation well. Counts  were correct at the end of the case.      Danae Orleans. Venetia Maxon, M.D.  Electronically Signed     JDS/MEDQ  D:  09/05/2006  T:  09/05/2006  Job:  [id number]

## 2011-04-15 NOTE — Cardiovascular Report (Signed)
NAMEBELFORD, [name]:  [id number]   MEDICAL RECORD NO.:  [id number]            PATIENT TYPE:   LOCATION:                                 FACILITY:   PHYSICIAN:  Charlies Constable, M.D. [facility]      DATE OF BIRTH:   DATE OF PROCEDURE:  09/27/2005  DATE OF DISCHARGE:                              CARDIAC CATHETERIZATION   PROCEDURE:  Cardiac catheterization and graft study.   CLINICAL HISTORY:  [name] is 77 years old and had previous bypass  surgery in 1992.  He had a Cardiolite scan earlier this year which did not  show ischemia, but he has had persistent symptoms of chest tightness and  shortness of breath with exertion and a decision was made to evaluate him  with angiography.   DESCRIPTION OF PROCEDURE:  The procedure was performed via the right femoral  artery utilizing an arterial sheath and  4-French preformed coronary  catheters. A JR-4 catheter was used for injection of the vein graft.  We  were unable to selectively inject the mammary artery with a LIMA catheter  and performed a subselective injection in the subclavian artery.  This  aortogram was performed to rule out abdominal aortic aneurysm. The patient  tolerated the procedure well, and left the laboratory in satisfactory  condition.   RESULTS:  The aortic pressure was 158/70 with a mean of 103 and the left  ventricular pressure was 158/19.   LEFT MAIN CORONARY ARTERY:  Left main coronary artery was free of  significant disease.   LEFT ANTERIOR DESCENDING ARTERY:  Left anterior descending artery was  completely cleared after a small septal perforator.   CIRCUMFLEX ARTERY:  The circumflex artery gave rise to an intermediate  branch, a marginal branch that was completely occluded, and two  posterolateral branches. Except for the marginal branch the circumflex was  free of significant obstruction.   RIGHT CORONARY ARTERY:  The right coronary artery gave rise to a right  ventricular branch and  then was completely occluded at its midportion.  The  saphenous vein graft to the distal right coronary artery was patent and  functioned normally and filled two posterolateral branches and retrograde  filled an acute marginal branch which was part of the inferior septum.   The saphenous vein graft to the marginal branch of the circumflex artery was  patent and functioned normally.   The LIMA graft to the diagonal branch of the LAD and the LAD was patent and  functioned normally.   LEFT VENTRICULOGRAM:  The left ventriculogram performed in the RAO  projection showed good wall motion with no areas of hypokinesis.  The  estimated ejection fraction was 60%.   DISTAL AORTOGRAM:  A distal aortogram was performed which showed patent  renal arteries, and no significant aortoiliac obstruction.   CONCLUSIONS:  1.  Coronary artery disease status post coronary artery bypass graft surgery      in 1992.  2.  Severe native vessel disease with total occlusion of left anterior      descending artery, total occlusion of the  marginal branch of the      circumflex artery, and total occlusion of the mid right  coronary      artery.  3.  Patent vein graft of the distal right coronary artery, patent vein graft      of the marginal branch of the circumflex artery, and patent LIMA graft      to the diagonal branch of the LAD and the LAD.  4.  Good LV function.   RECOMMENDATIONS:  All grafts are patent and the patient's coronary  circulation appears quite good and his LV function is normal.  Will plan  reassurance and further search for the etiology of the patient's symptoms.           ______________________________  Charlies Constable, M.D. [facility]     BB/MEDQ  D:  09/27/2005  T:  09/27/2005  Job:  [id number]   cc:   Jonelle Sidle, M.D. [facility]  [address]  [place]  [place] [address]   Winona Legato, M.D.

## 2011-08-26 LAB — BASIC METABOLIC PANEL
BUN: 20
CO2: 22
Calcium: 9.3
Chloride: 103
Creatinine, Ser: 1.14
GFR calc Af Amer: >60
Glucose, Bld: 196 — ABNORMAL HIGH

## 2011-08-26 LAB — HEMOGLOBIN AND HEMATOCRIT, BLOOD: HCT: 36.5 — ABNORMAL LOW

## 2011-11-01 ENCOUNTER — Telehealth: Payer: Self-pay | Admitting: Orthopedic Surgery

## 2011-11-01 ENCOUNTER — Encounter: Payer: Self-pay | Admitting: *Deleted

## 2011-11-01 ENCOUNTER — Emergency Department (HOSPITAL_COMMUNITY)
Admission: EM | Admit: 2011-11-01 | Discharge: 2011-11-01 | Disposition: A | Discharge status: Home / Self Care | Payer: Medicare Other | Attending: Emergency Medicine | Admitting: Emergency Medicine

## 2011-11-01 ENCOUNTER — Emergency Department (HOSPITAL_COMMUNITY): Payer: Medicare Other

## 2011-11-01 DIAGNOSIS — W19XXXA Unspecified fall, initial encounter: Secondary | ICD-10-CM | POA: Insufficient documentation

## 2011-11-01 DIAGNOSIS — S8011XA Contusion of right lower leg, initial encounter: Secondary | ICD-10-CM

## 2011-11-01 DIAGNOSIS — Z7982 Long term (current) use of aspirin: Secondary | ICD-10-CM | POA: Insufficient documentation

## 2011-11-01 DIAGNOSIS — S86919A Strain of unspecified muscle(s) and tendon(s) at lower leg level, unspecified leg, initial encounter: Secondary | ICD-10-CM

## 2011-11-01 DIAGNOSIS — I2510 Atherosclerotic heart disease of native coronary artery without angina pectoris: Secondary | ICD-10-CM | POA: Insufficient documentation

## 2011-11-01 DIAGNOSIS — S8010XA Contusion of unspecified lower leg, initial encounter: Secondary | ICD-10-CM | POA: Insufficient documentation

## 2011-11-01 DIAGNOSIS — IMO0002 Reserved for concepts with insufficient information to code with codable children: Secondary | ICD-10-CM | POA: Insufficient documentation

## 2011-11-01 DIAGNOSIS — M25559 Pain in unspecified hip: Secondary | ICD-10-CM | POA: Insufficient documentation

## 2011-11-01 DIAGNOSIS — IMO0001 Reserved for inherently not codable concepts without codable children: Secondary | ICD-10-CM | POA: Insufficient documentation

## 2011-11-01 MED ORDER — HYDROCODONE-ACETAMINOPHEN 5-325 MG PO TABS: 1.0000 | ORAL_TABLET | Freq: Four times a day (QID) | ORAL | Status: DC | PRN

## 2011-11-01 MED ORDER — HYDROMORPHONE HCL PF 1 MG/ML IJ SOLN
INTRAMUSCULAR | Status: AC
  Administered 2011-11-01: 0.5 mg via INTRAMUSCULAR
  Filled 2011-11-01: qty 1

## 2011-11-01 MED ORDER — HYDROMORPHONE HCL PF 1 MG/ML IJ SOLN
0.5000 mg | Freq: Once | INTRAMUSCULAR | Status: AC
  Administered 2011-11-01: 0.5 mg via INTRAMUSCULAR

## 2011-11-01 NOTE — ED Notes (Signed)
Patient transported to X-ray 

## 2011-11-01 NOTE — ED Notes (Signed)
MD at bedside. 

## 2011-11-01 NOTE — Telephone Encounter (Signed)
[name] called this afternoon at 4:55, saying he had fallen and was not able to come to the office, and wanted you to make a house call. Told him he needed to go to the ER for a complete work-up and if he was not able to drive he needed to call [phone]

## 2011-11-01 NOTE — ED Provider Notes (Signed)
History  Scribed for Kyle Lennert, MD, the patient was seen in room APA18/APA18. This chart was scribed by Candelaria Stagers. The   CSN: [id number] Arrival date & time: 11/01/2011  7:09 PM   First MD Initiated Contact with Patient 11/01/11 1926      Chief Complaint  Patient presents with  . Leg Pain     The history is provided by the patient.   BROUGHTON [name] is a 77 y.o. male who presents to the Emergency Department complaining of leg pain in the right leg.  He states that he has had cramping in his right leg over the last three weeks, but today it has gotten worse causing him to fall several hours ago.  After the fall he was unable to bear weight on the right leg.  He is also experiencing pain in the right hip.  He had plan to see Dr. Romeo Apple for the leg cramping before today.      Past Medical History  Diagnosis Date  . Coronary artery disease     Past Surgical History  Procedure Date  . Tonsillectomy   . Cholecystectomy   . Knee arthroscopy   . Hernia repair   . Coronary artery bypass graft     No family history on file.  History  Substance Use Topics  . Smoking status: Never Smoker   . Smokeless tobacco: Not on file  . Alcohol Use: No      Review of Systems  Constitutional: Negative for fever and chills.  HENT: Negative for rhinorrhea and neck pain.   Eyes: Negative for pain.  Respiratory: Negative for cough and shortness of breath.   Cardiovascular: Negative for chest pain.  Gastrointestinal: Negative for nausea, vomiting, abdominal pain and diarrhea.  Genitourinary: Negative for dysuria.  Musculoskeletal: Positive for myalgias (right leg and hip ). Negative for back pain.  Skin: Negative for rash.  Neurological: Negative for dizziness and weakness.    Allergies  Tylenol-codeine  Home Medications   Current Outpatient Rx  Name Route Sig Dispense Refill  . ASPIRIN EC 81 MG PO TBEC Oral Take 81 mg by mouth at bedtime.      . OCUVITE PO TABS Oral  Take 1 tablet by mouth daily.      . DUTASTERIDE 0.5 MG PO CAPS Oral Take 0.5 mg by mouth daily.      . IBUPROFEN 200 MG PO TABS Oral Take 200 mg by mouth as needed. For pain     . FLULAVAL IM Intramuscular Inject 1 each into the muscle once.      . METFORMIN HCL 500 MG PO TABS Oral Take 500-1,000 mg by mouth 2 (two) times daily with a meal. Take two tablets every morning and take one tablet at bedtime     . PINDOLOL 5 MG PO TABS Oral Take 5 mg by mouth 2 (two) times daily.      Marland Kitchen SITAGLIPTIN PHOSPHATE 100 MG PO TABS Oral Take 100 mg by mouth daily.      Marland Kitchen VALSARTAN 160 MG PO TABS Oral Take 160 mg by mouth daily.      Marland Kitchen HYDROCODONE-ACETAMINOPHEN 5-325 MG PO TABS Oral Take 1 tablet by mouth every 6 (six) hours as needed for pain. 15 tablet 0    BP 147/66  Pulse 88  Temp(Src) 98.3 F (36.8 C) (Oral)  Resp 20  Ht 5\' 7"  (1.702 m)  Wt 183 lb (83.008 kg)  BMI 28.66 kg/m2  SpO2 96%  Physical  Exam  Nursing note and vitals reviewed. Constitutional: He is oriented to person, place, and time. He appears well-developed and well-nourished. No distress.  HENT:  Head: Normocephalic and atraumatic.  Eyes: EOM are normal. Pupils are equal, round, and reactive to light.  Neck: Neck supple. No tracheal deviation present.  Cardiovascular: Normal rate.   Pulmonary/Chest: Effort normal. No respiratory distress.  Abdominal: He exhibits no distension.  Musculoskeletal: Normal range of motion.       Tender in the back of the right thigh   Neurological: He is alert and oriented to person, place, and time. No sensory deficit.  Skin: Skin is warm and dry.  Psychiatric: He has a normal mood and affect. His behavior is normal.    ED Course  Procedures  DIAGNOSTIC STUDIES: Oxygen Saturation is 96% on room air, normal by my interpretation.    COORDINATION OF CARE:  7:37PM  Ordered: DG Hip Complete Right ; DG Femur Right  9:00PM Recheck: Pt improved with medication.  Discussed course of care and  follow up with PCP.    Labs Reviewed - No data to display Dg Pelvis 1-2 Views  11/01/2011  *RADIOLOGY REPORT*  Clinical Data: Larey Seat and injured right hip and right leg.  PELVIS - 1-2 VIEW 11/01/2011:  Comparison: Right femur x-rays obtained concurrently.  Findings: No acute fractures involving the pelvis.  Hip joints intact with well-preserved joint spaces for age.  Sacroiliac joints and symphysis pubis intact.  Well-preserved bone mineral density for age.  Prior L4-5 fusion.  IMPRESSION: No acute or significant osseous abnormality.  Original Report Authenticated By: Arnell Sieving, M.D.   Dg Femur Right  11/01/2011  *RADIOLOGY REPORT*  Clinical Data: Larey Seat and injured right hip and right leg.  RIGHT FEMUR - 2 VIEW 11/01/2011:  Comparison: AP pelvis x-ray obtained concurrently.  Findings: No acute fractures involving the femur.  Well-preserved bone mineral density.  Moderate medial compartment joint space narrowing at the knee.  IMPRESSION: Normal femur.  Medial compartment degenerative changes at the knee.  Original Report Authenticated By: Arnell Sieving, M.D.     1. Strain of tendon of lower extremity   2. Contusion of right lower extremity       MDM  Pain in left thigh from fall.  Strain muscle or ligament   The chart was scribed for me under my direct supervision.  I personally performed the history, physical, and medical decision making and all procedures in the evaluation of this patient.Kyle Lennert, MD 11/01/11 2110

## 2011-11-01 NOTE — ED Notes (Signed)
Pt reports he has had some muscle cramping in the back of his rt leg for the past week, reports tonight his leg "gave away" and now is unable to bear weight

## 2011-11-10 ENCOUNTER — Ambulatory Visit (INDEPENDENT_AMBULATORY_CARE_PROVIDER_SITE_OTHER): Payer: Medicare Other | Admitting: Orthopedic Surgery

## 2011-11-10 ENCOUNTER — Encounter: Payer: Self-pay | Admitting: Orthopedic Surgery

## 2011-11-10 VITALS — BP 90/60 | Ht 67.0 in | Wt 183.0 lb

## 2011-11-10 DIAGNOSIS — M5430 Sciatica, unspecified side: Secondary | ICD-10-CM

## 2011-11-10 MED ORDER — GABAPENTIN 100 MG PO CAPS: 100.0000 mg | ORAL_CAPSULE | Freq: Three times a day (TID) | ORAL | Status: DC

## 2011-11-10 MED ORDER — HYDROCODONE-ACETAMINOPHEN 5-500 MG PO TABS: 1.0000 | ORAL_TABLET | ORAL | Status: DC | PRN

## 2011-11-10 NOTE — Patient Instructions (Signed)
Refer back to Dr Venetia Maxon: right leg radiculopathy weakness

## 2011-11-10 NOTE — Progress Notes (Signed)
   77 year old male previous back fusion RIGHT leg gave way patient can't ambulate on his RIGHT lower extremity.  Incident was 2 weeks ago.  He was treated with a steroid Dosepak and hydrocodone.  X-rays of his hip pelvis and femur in the emergency room including a knee film which were all normal.  He has known spinal stenosis.  He's having severe pain numbness and tingling from his RIGHT hip down to his RIGHT foot with weakness when he tries to stand.  He is having some slow urination but he urinates and defecates normally  He has no history of fever chills.  No burning pain in his perineum.  His medical history as noted above  His previous MRI from 2011 is as follows  IMPRESSION:  1. No adverse features status post L4-L5 fusion and decompression.  Markedly improved thecal sac patency at this level.  2. Mild progression of multifactorial spinal stenosis at L3-L4,  moderate to severe.  3. Mild progression of facet hypertrophy L5-S1 contributing to mild right lateral recess stenosis.  3. No significant change in multifactorial moderate spinal  stenosis at L2-L3 and mild spinal stenosis at L1-L2.   Examination reveals a well-developed well-nourished male in no acute distress sitting in a wheelchair which she needs to ambulate now.  His skin is intact in both lower extremities.  Has equal reflexes are 1+ at the knees.  He has good plantarflexion although this caused pain in his RIGHT lower extremity.  He has normal hip flexion knee extension knee flexion bilaterally and is equal.  I reviewed the x-rays from the hospital I reviewed his MRI as noted above  Diagnosis of acute sciatica with underlying spinal stenosis  Recommend That he see his neurosurgeon for these types of problems.

## 2011-11-11 ENCOUNTER — Telehealth: Payer: Self-pay | Admitting: Radiology

## 2011-11-11 NOTE — Telephone Encounter (Signed)
I faxed a referral for this patient to see Dr. Venetia Maxon at [facility]. He has seen him in the past.

## 2012-01-29 ENCOUNTER — Encounter (HOSPITAL_COMMUNITY): Payer: Self-pay

## 2012-01-29 ENCOUNTER — Emergency Department (HOSPITAL_COMMUNITY)
Admission: EM | Admit: 2012-01-29 | Discharge: 2012-01-30 | Disposition: A | Discharge status: Home / Self Care | Payer: Medicare Other | Attending: Emergency Medicine | Admitting: Emergency Medicine

## 2012-01-29 ENCOUNTER — Emergency Department (HOSPITAL_COMMUNITY): Payer: Medicare Other

## 2012-01-29 DIAGNOSIS — E785 Hyperlipidemia, unspecified: Secondary | ICD-10-CM | POA: Insufficient documentation

## 2012-01-29 DIAGNOSIS — E119 Type 2 diabetes mellitus without complications: Secondary | ICD-10-CM | POA: Insufficient documentation

## 2012-01-29 DIAGNOSIS — R109 Unspecified abdominal pain: Secondary | ICD-10-CM | POA: Insufficient documentation

## 2012-01-29 DIAGNOSIS — E860 Dehydration: Secondary | ICD-10-CM | POA: Insufficient documentation

## 2012-01-29 DIAGNOSIS — Z79899 Other long term (current) drug therapy: Secondary | ICD-10-CM | POA: Insufficient documentation

## 2012-01-29 DIAGNOSIS — R197 Diarrhea, unspecified: Secondary | ICD-10-CM | POA: Insufficient documentation

## 2012-01-29 DIAGNOSIS — I2510 Atherosclerotic heart disease of native coronary artery without angina pectoris: Secondary | ICD-10-CM | POA: Insufficient documentation

## 2012-01-29 DIAGNOSIS — I4891 Unspecified atrial fibrillation: Secondary | ICD-10-CM | POA: Insufficient documentation

## 2012-01-29 HISTORY — DX: Unspecified atrial fibrillation: I48.91

## 2012-01-29 LAB — COMPREHENSIVE METABOLIC PANEL WITH GFR
ALT: 29 U/L (ref 0–53)
AST: 41 U/L — ABNORMAL HIGH (ref 0–37)
Albumin: 4 g/dL (ref 3.5–5.2)
Alkaline Phosphatase: 76 U/L (ref 39–117)
BUN: 21 mg/dL (ref 6–23)
Chloride: 100 meq/L (ref 96–112)
Potassium: 3.9 meq/L (ref 3.5–5.1)
Sodium: 131 meq/L — ABNORMAL LOW (ref 135–145)
Total Bilirubin: 0.3 mg/dL (ref 0.3–1.2)
Total Protein: 7.8 g/dL (ref 6.0–8.3)

## 2012-01-29 LAB — DIFFERENTIAL
Basophils Absolute: 0 10*3/uL (ref 0.0–0.1)
Basophils Relative: 0 % (ref 0–1)
Eosinophils Absolute: 0.1 K/uL (ref 0.0–0.7)
Eosinophils Relative: 1 % (ref 0–5)
Lymphocytes Relative: 12 % (ref 12–46)
Lymphs Abs: 1.3 K/uL (ref 0.7–4.0)
Monocytes Absolute: 0.7 K/uL (ref 0.1–1.0)
Monocytes Relative: 7 % (ref 3–12)
Neutro Abs: 9 10*3/uL — ABNORMAL HIGH (ref 1.7–7.7)
Neutrophils Relative %: 81 % — ABNORMAL HIGH (ref 43–77)

## 2012-01-29 LAB — CBC
HCT: 43.1 % (ref 39.0–52.0)
Hemoglobin: 14.4 g/dL (ref 13.0–17.0)
MCH: 31.2 pg (ref 26.0–34.0)
MCHC: 33.4 g/dL (ref 30.0–36.0)
MCV: 93.5 fL (ref 78.0–100.0)
Platelets: 139 10*3/uL — ABNORMAL LOW (ref 150–400)
RBC: 4.61 MIL/uL (ref 4.22–5.81)
RDW: 13.5 % (ref 11.5–15.5)
WBC: 11.1 K/uL — ABNORMAL HIGH (ref 4.0–10.5)

## 2012-01-29 LAB — URINALYSIS, ROUTINE W REFLEX MICROSCOPIC
Bilirubin Urine: NEGATIVE
Glucose, UA: NEGATIVE mg/dL
Ketones, ur: NEGATIVE mg/dL
Leukocytes, UA: NEGATIVE
Nitrite: NEGATIVE
Protein, ur: 100 mg/dL — AB
Specific Gravity, Urine: >1.03 — ABNORMAL HIGH (ref 1.005–1.030)
Urobilinogen, UA: 0.2 mg/dL (ref 0.0–1.0)
pH: 5.5 (ref 5.0–8.0)

## 2012-01-29 LAB — URINE MICROSCOPIC-ADD ON

## 2012-01-29 LAB — LIPASE, BLOOD: Lipase: 43 U/L (ref 11–59)

## 2012-01-29 LAB — COMPREHENSIVE METABOLIC PANEL
CO2: 18 mEq/L — ABNORMAL LOW (ref 19–32)
Calcium: 8.6 mg/dL (ref 8.4–10.5)
Creatinine, Ser: 0.88 mg/dL (ref 0.50–1.35)
GFR calc Af Amer: 84 mL/min — ABNORMAL LOW (ref >90)
GFR calc non Af Amer: 73 mL/min — ABNORMAL LOW (ref >90)
Glucose, Bld: 142 mg/dL — ABNORMAL HIGH (ref 70–99)

## 2012-01-29 MED ORDER — FENTANYL CITRATE 0.05 MG/ML IJ SOLN
50.0000 ug | Freq: Once | INTRAMUSCULAR | Status: AC
  Administered 2012-01-29: 50 ug via INTRAVENOUS

## 2012-01-29 MED ORDER — IOHEXOL 300 MG/ML  SOLN
100.0000 mL | Freq: Once | INTRAMUSCULAR | Status: AC | PRN
  Administered 2012-01-29: 100 mL via INTRAVENOUS

## 2012-01-29 MED ORDER — SODIUM CHLORIDE 0.9 % IV SOLN
Freq: Once | INTRAVENOUS | Status: AC
  Administered 2012-01-29: 22:00:00 via INTRAVENOUS

## 2012-01-29 MED ORDER — ONDANSETRON HCL 4 MG/2ML IJ SOLN
4.0000 mg | Freq: Once | INTRAMUSCULAR | Status: AC
  Administered 2012-01-29: 4 mg via INTRAVENOUS
  Filled 2012-01-29: qty 2

## 2012-01-29 MED ORDER — FENTANYL CITRATE 0.05 MG/ML IJ SOLN
INTRAMUSCULAR | Status: AC
  Filled 2012-01-29: qty 2

## 2012-01-29 NOTE — ED Provider Notes (Signed)
History     CSN: [id number]  Arrival date & time 01/29/12  1924   First MD Initiated Contact with Patient 01/29/12 2023      Chief Complaint  Patient presents with  . Abdominal Pain  . Emesis  . Diarrhea    (Consider location/radiation/quality/duration/timing/severity/associated sxs/prior treatment) HPI Comments: Pt reports onset of nausea and vomiting 4 days ago, then has since developed worsening abdominal pain in middle and mid lower abdomen, diffusely, no radiation, denies CP, pelvic pain or flank pain.  Was somewhat controllable at first at home, finally came tonight because pain was getting worse.  Has been passing a lot of flatus, some loose stools, but not much.  No fevers or chills or sweats.  He has had several abd surgeries including cholecystectomy and hernia repair.  He has a sig h/o HTN, hyperlipidemia and CAD and DM.    Patient is a 78 y.o. male presenting with abdominal pain, vomiting, and diarrhea. The history is provided by the patient and a relative.  Abdominal Pain The primary symptoms of the illness include abdominal pain, fatigue, nausea, vomiting and diarrhea. The primary symptoms of the illness do not include fever or shortness of breath.  Symptoms associated with the illness do not include chills.  Emesis  Associated symptoms include abdominal pain and diarrhea. Pertinent negatives include no chills, no cough, no fever and no headaches.  Diarrhea The primary symptoms include fatigue, abdominal pain, nausea, vomiting and diarrhea. Primary symptoms do not include fever.  The illness does not include chills.    Past Medical History  Diagnosis Date  . Coronary artery disease   . Hyperlipidemia   . Diabetes mellitus   . Atrial fibrillation     Past Surgical History  Procedure Date  . Tonsillectomy   . Cholecystectomy   . Knee arthroscopy   . Hernia repair   . Coronary artery bypass graft   . Back surgery     Family History  Problem Relation Age of  Onset  . Diabetes      History  Substance Use Topics  . Smoking status: Never Smoker   . Smokeless tobacco: Not on file  . Alcohol Use: No      Review of Systems  Constitutional: Positive for appetite change and fatigue. Negative for fever and chills.  HENT: Negative for congestion and rhinorrhea.   Respiratory: Negative for cough and shortness of breath.   Cardiovascular: Negative for chest pain and leg swelling.  Gastrointestinal: Positive for nausea, vomiting, abdominal pain and diarrhea.  Neurological: Positive for weakness and light-headedness. Negative for syncope and headaches.  All other systems reviewed and are negative.    Allergies  Penicillins and Tylenol-codeine  Home Medications   Current Outpatient Rx  Name Route Sig Dispense Refill  . ASPIRIN EC 81 MG PO TBEC Oral Take 81 mg by mouth at bedtime.      . OCUVITE PO TABS Oral Take 1 tablet by mouth daily.      . DUTASTERIDE 0.5 MG PO CAPS Oral Take 0.5 mg by mouth daily.      Marland Kitchen HYDROCODONE-ACETAMINOPHEN 5-500 MG PO TABS Oral Take 1 tablet by mouth every 4 (four) hours as needed for pain. 60 tablet 1  . METFORMIN HCL 500 MG PO TABS Oral Take 500-1,000 mg by mouth 2 (two) times daily with a meal. Take two tablets every morning and take one tablet at bedtime     . PINDOLOL 5 MG PO TABS Oral Take 5 mg  by mouth 2 (two) times daily.      Marland Kitchen SITAGLIPTIN PHOSPHATE 100 MG PO TABS Oral Take 100 mg by mouth daily.      Marland Kitchen VALSARTAN 160 MG PO TABS Oral Take 160 mg by mouth daily.        BP 167/92  Pulse 83  Temp(Src) 97.3 F (36.3 C) (Oral)  Resp 16  Ht 5\' 8"  (1.727 m)  Wt 175 lb (79.379 kg)  BMI 26.61 kg/m2  SpO2 100%  Physical Exam  Nursing note and vitals reviewed. Constitutional: He is oriented to person, place, and time. He appears well-developed and well-nourished.  HENT:  Head: Normocephalic and atraumatic.  Cardiovascular: Normal rate.   Pulmonary/Chest: Effort normal. No respiratory distress. He has no  wheezes.  Abdominal: Normal appearance. He exhibits distension. He exhibits no fluid wave and no mass. Bowel sounds are increased. There is tenderness in the epigastric area and periumbilical area. There is guarding. There is no rigidity, no rebound and no CVA tenderness.  Musculoskeletal: He exhibits no edema and no tenderness.  Neurological: He is alert and oriented to person, place, and time. He has normal strength. GCS eye subscore is 4. GCS verbal subscore is 5. GCS motor subscore is 6.  Psychiatric: He has a normal mood and affect. His speech is normal.    ED Course  Procedures (including critical care time)   Labs Reviewed  CBC  DIFFERENTIAL  COMPREHENSIVE METABOLIC PANEL  LIPASE, BLOOD  URINALYSIS, ROUTINE W REFLEX MICROSCOPIC   No results found.   No diagnosis found.    MDM   Pt with some tympanitic BS, but is passing flatus.  Diffuse abd tenderness, no rebound, will give IVF's, IV antiemetics and analgesics.  Will need plain films, but may also need CT Scan as I am concerned for possible partial or intermittent bowel obstruction.        9:44 PM I reviewed pt's plain films myself.  No free air.  Distended loops of bowel.  Will order CT scan.    9:59 PM Pt reports IV fentanyl did improve pain somewhat.  CT scan ordered and is pending.  Cr from 2011 was normal.  Pt is signed out to Dr. Estell Harpin.    Gavin Pound. Darrel Baroni, MD 01/29/12 2200

## 2012-01-29 NOTE — ED Notes (Signed)
Pt presents with abdominal pain and n/v/d since Thursday.

## 2012-01-29 NOTE — ED Notes (Signed)
Attempted iv to [employer] x 2 without success

## 2012-01-29 NOTE — ED Notes (Addendum)
C/o abd pain with nausea and vomiting at home, mild diarrhea per pt

## 2012-01-30 MED ORDER — SODIUM CHLORIDE 0.9 % IV BOLUS (SEPSIS)
1000.0000 mL | Freq: Once | INTRAVENOUS | Status: AC
  Administered 2012-01-30: 1000 mL via INTRAVENOUS

## 2012-01-30 NOTE — Discharge Instructions (Signed)
Abdominal Pain Abdominal pain can be caused by many things. Your caregiver decides the seriousness of your pain by an examination and possibly blood tests and X-rays. Many cases can be observed and treated at home. Most abdominal pain is not caused by a disease and will probably improve without treatment. However, in many cases, more time must pass before a clear cause of the pain can be found. Before that point, it may not be known if you need more testing, or if hospitalization or surgery is needed. HOME CARE INSTRUCTIONS   Do not take laxatives unless directed by your caregiver.   Take pain medicine only as directed by your caregiver.   Only take over-the-counter or prescription medicines for pain, discomfort, or fever as directed by your caregiver.   Try a clear liquid diet (broth, tea, or water) for as long as directed by your caregiver. Slowly move to a bland diet as tolerated.  SEEK IMMEDIATE MEDICAL CARE IF:   The pain does not go away.   You have a fever.   You keep throwing up (vomiting).   The pain is felt only in portions of the abdomen. Pain in the right side could possibly be appendicitis. In an adult, pain in the left lower portion of the abdomen could be colitis or diverticulitis.   You pass bloody or black tarry stools.  MAKE SURE YOU:   Understand these instructions.   Will watch your condition.   Will get help right away if you are not doing well or get worse.  Document Released: 08/24/2005 Document Revised: 11/03/2011 Document Reviewed: 07/02/2008 [facility] Patient Information 2012 [place], [place].Dehydration, Adult Dehydration means your body does not have as much fluid as it needs. Your kidneys, brain, and heart will not work properly without the right amount of fluids and salt.  HOME CARE  Ask your doctor how to replace body fluid losses (rehydrate).   Drink enough fluids to keep your pee (urine) clear or pale yellow.   Drink small amounts of fluids often  if you feel sick to your stomach (nauseous) or throw up (vomit).   Eat like you normally do.   Avoid:   Foods or drinks high in sugar.   Bubbly (carbonated) drinks.   Juice.   Very hot or cold fluids.   Drinks with caffeine.   Fatty, greasy foods.   Alcohol.   Tobacco.   Eating too much.   Gelatin desserts.   Wash your hands to avoid spreading germs (bacteria, viruses).   Only take medicine as told by your doctor.   Keep all doctor visits as told.  GET HELP RIGHT AWAY IF:   You cannot drink something without throwing up.   You get worse even with treatment.   Your vomit has blood in it or looks greenish.   Your poop (stool) has blood in it or looks black and tarry.   You have not peed in 6 to 8 hours.   You pee a small amount of very dark pee.   You have a fever.   You pass out (faint).   You have belly (abdominal) pain that gets worse or stays in one spot (localizes).   You have a rash, stiff neck, or bad headache.   You get easily annoyed, sleepy, or are hard to wake up.   You feel weak, dizzy, or very thirsty.  MAKE SURE YOU:   Understand these instructions.   Will watch your condition.   Will get help right away  if you are not doing well or get worse.  Document Released: 09/10/2009 Document Revised: 11/03/2011 Document Reviewed: 07/04/2011 [facility] Patient Information 2012 [place], [place].Diarrhea Diarrhea is watery poop (stool). The most common cause of diarrhea is a germ. Other causes include:  Food poisoning.   A reaction to medicine.  HOME CARE   Drink clear fluids. This can stop you from losing too much body fluid (dehydration).   Drink enough fluids to keep your pee (urine) clear or pale yellow.   Avoid solid foods and dairy products until you start to feel better. Then start eating bland foods, such as:   Bananas.   Rice.   Crackers.   Applesauce.   Dry toast.   Avoid spicy foods, caffeine, and alcohol.   Your doctor  may give medicine to help with cramps and watery poop. Take this as told. Avoid these medicines if you have a fever or blood in your poop.   Take your medicine as told. Finish them even if you start to feel better.  GET HELP RIGHT AWAY IF:   The watery poop lasts longer than 3 days.   You have a fever.   Your baby is older than 3 months with a rectal temperature of 100.5 F (38.1 C) or higher for more than 1 day.   There is blood in your poop.   You start to throw up (vomit).   You lose too much fluid.  MAKE SURE YOU:   Understand these instructions.   Will watch your condition.   Will get help right away if you are not doing well or get worse.  Document Released: 05/02/2008 Document Revised: 11/03/2011 Document Reviewed: 05/02/2008 [facility] Patient Information 2012 [place], [place].  RESOURCE GUIDE  Dental Problems  Patients with Medicaid: [facility] [phone] [address]                                           [id number] Phone:  [phone]                                                  Phone:  [phone]  If unable to pay or uninsured, contact:  [facility] or [facility]. to become qualified for the adult dental clinic.  Chronic Pain Problems Contact [facility]  [phone] Patients need to be referred by their primary care doctor.  Insufficient Money for Medicine Contact [employer]:  call "211" or [facility] 479-398-6409.  No Primary Care Doctor Call [facility]  707-669-2608 Other agencies that provide inexpensive medical care    [facility]  [phone]    [facility] Internal Medicine  [phone]    [facility]  [phone]    [facility]  [phone]    [facility]  [phone]    [facility]  [phone]  Psychological Services [facility]  [phone] [facility] Services  [phone] [facility] Mental [facility]   [phone] (emergency services 6604883451)  Substance Abuse Resources [facility]  320-628-1618 [facility] [phone]  [facility] [phone] [facility] [phone] Residential & Outpatient Substance Abuse Program  [phone]  [facility] Child Abuse Hotline [phone] [facility] Child Abuse Hotline 6040238130 (After Hours)  Emergency Shelter [facility] [phone]  [facility] [place] [facility] [phone] [facility] (734)[phone]  MRSA Hotline #:   901-151-5456    [facility] Resources  [facility] [place]     [employer]                          [facility] Dept. [address] [place]                       [address]      [address]  Blondell Reveal Phone:  [phone]                                   Phone:  [phone]                 Phone:  [phone]  [facility] Mental Health Phone:  [phone]  [facility] Child Abuse Hotline (704) 518-8062 [phone] (After Hours)

## 2012-01-30 NOTE — ED Provider Notes (Signed)
Ct Abdomen Pelvis W Contrast  01/30/2012  *RADIOLOGY REPORT*  Clinical Data: Pain, back pain, emesis, diarrhea, history coronary artery disease, CABG, atrial fibrillation, cholecystectomy, hernia repair, diabetes, hyperlipidemia  CT ABDOMEN AND PELVIS WITH CONTRAST  Technique:  Multidetector CT imaging of the abdomen and pelvis was performed following the standard protocol during bolus administration of intravenous contrast. Sagittal and coronal MPR images reconstructed from axial data set.  Contrast: OMNIPAQUE IOHEXOL 300 MG/ML IJ SOLN. Dilute oral contrast.  Comparison: None  Findings: Dependent atelectasis bilateral lung bases. Probable emphysematous changes. Enlargement of cardiac chambers post median sternotomy. Atherosclerotic calcifications aorta and coronary arteries. Dense mitral annular calcification. Retained GI contrast within thoracic esophagus, question dysmotility versus gastroesophageal reflux. Peripelvic left renal cyst. Nonspecific tiny low attenuation foci within right lobe liver.  Remainder of liver, spleen, atrophic pancreas, kidneys, and adrenal glands normal appearance. Upper normal-sized gastrohepatic ligament node 10 x 9 mm. Normal appendix. Unremarkable bladder and ureters. Distal small bowel loops and colon are unopacified. Gaseous distention of colon with scattered air-fluid levels consistent with  diarrhea. No other definite bowel abnormalities identified. Left inguinal hernia containing fat. Thickening of right internal inguinal ring question prior hernia repair. Thickening extends throughout right inguinal canal with right hydrocele noted. No bowel herniation identified. No mass, adenopathy or free fluid. Prior lumbar spine surgery. No acute osseous findings.  IMPRESSION: Distention of colon containing air and fluid compatible with history of diarrhea. Extensive atherosclerotic disease. Tiny nonspecific low attenuation foci within liver, too small to characterize. Left inguinal  hernia containing fat with question prior right inguinal herniorrhaphy. Right hydrocele.  Original Report Authenticated By: Lollie Marrow, M.D.   Dg Abd Acute W/chest  01/29/2012  *RADIOLOGY REPORT*  Clinical Data: Abdominal pain with vomiting and diarrhea.  ACUTE ABDOMEN SERIES (ABDOMEN 2 VIEW & CHEST 1 VIEW)  Comparison: Chest x-ray dated 09/01/2006 and pelvic radiograph dated 11/01/2011  Findings: Heart size and vascularity are within normal limits. Evidence of prior CABG.  Minimal atelectasis at the lung bases.  The patient has gaseous distention of a redundant colon with air- fluid levels.  There is air within the nondistended distal colon and rectum.  No dilated small bowel.  No visible free air.  Prior lower lumbar fusion.  No acute osseous abnormality.  IMPRESSION: Gaseous distention of the colon without evidence suggestive of obstruction.  Original Report Authenticated By: Gwynn Burly, M.D.   78 year old male with abdominal pain and diarrhea for the past 4 days. Discussed the results of the CT with patient. On my evaluation patient reports resolution of his pain. Says he feels better. Patient was offered admission but is declining at this time. He is agreeable to a second liter of IV fluids though. UA with a high specific gravity consistent with dehydration. Bicarbonate also noted to be 18. Suspect viral illness. CT did not show any evidence of acute emergent process. Labs reassuring aside from the aforementioned. Patient would like to go home which is I think is reasonable. Return precautions were discussed. Outpatient followup.  Raeford Razor, MD 01/30/12 [phone]

## 2012-01-30 NOTE — ED Notes (Signed)
Pt receiving bolus and then will be discharged.

## 2013-03-13 ENCOUNTER — Ambulatory Visit: Payer: Medicare Other | Admitting: Cardiology

## 2013-03-29 ENCOUNTER — Encounter: Payer: Self-pay | Admitting: Cardiology

## 2013-03-29 ENCOUNTER — Ambulatory Visit (INDEPENDENT_AMBULATORY_CARE_PROVIDER_SITE_OTHER): Payer: BC Managed Care – PPO | Admitting: Cardiology

## 2013-03-29 VITALS — BP 120/73 | HR 59 | Ht 67.5 in | Wt 181.0 lb

## 2013-03-29 DIAGNOSIS — E119 Type 2 diabetes mellitus without complications: Secondary | ICD-10-CM

## 2013-03-29 DIAGNOSIS — I4891 Unspecified atrial fibrillation: Secondary | ICD-10-CM

## 2013-03-29 DIAGNOSIS — I2510 Atherosclerotic heart disease of native coronary artery without angina pectoris: Secondary | ICD-10-CM

## 2013-03-29 NOTE — Assessment & Plan Note (Signed)
Clinically stable with multivessel disease status post CABG. Continue conservative management.

## 2013-03-29 NOTE — Progress Notes (Signed)
Clinical Summary [name] is a 79 y.o.male last seen in the office in December 2009. He states that he has been following with [name] in the meanwhile. Lives in his own home, takes care of himself and his ADLs although he has started to think about transitioning to a nursing home. He reports continued, progressive problems with his balance, intermittent falls. Fortunately he has not had any fractures. He does use a cane.  ECG today shows atrial fibrillation with RBBB and LAFB. We have not anticoagulated him over the years. He has been taking low-dose aspirin.  Fortunately, he reports no angina symptoms at his present level of activity. We are treating him conservatively with medical therapy. No sudden dizziness or syncope without explanation.   Allergies  Allergen Reactions  . Acetaminophen-Codeine   . Penicillins     Current Outpatient Prescriptions  Medication Sig Dispense Refill  . aspirin EC 81 MG tablet Take 81 mg by mouth at bedtime.        . beta carotene w/minerals (OCUVITE) tablet Take 1 tablet by mouth daily.        Marland Kitchen dutasteride (AVODART) 0.5 MG capsule Take 0.5 mg by mouth daily.        . metFORMIN (GLUCOPHAGE) 500 MG tablet Take 500-1,000 mg by mouth 2 (two) times daily with a meal. Patient takes 2 tablets at lunch and 1 tablet at bedtime      . pindolol (VISKEN) 5 MG tablet Take 5 mg by mouth 2 (two) times daily.        . sitaGLIPtin (JANUVIA) 100 MG tablet Take 100 mg by mouth daily.        . valsartan (DIOVAN) 160 MG tablet Take 160 mg by mouth daily.         No current facility-administered medications for this visit.    Past Medical History  Diagnosis Date  . Coronary atherosclerosis of native coronary artery     Multivessel status post CABG  . Mixed hyperlipidemia   . Type 2 diabetes mellitus   . Atrial fibrillation   . Essential hypertension, benign     Past Surgical History  Procedure Laterality Date  . Tonsillectomy    . Cholecystectomy    . Knee  arthroscopy    . Hernia repair    . Coronary artery bypass graft  1992    LIMA to LAD and diagonal, SVG to RCA, SVG to OM  . Back surgery      Social History [name] reports that he has never smoked. He does not have any smokeless tobacco history on file. [name] reports that he does not drink alcohol.  Review of Systems Hard of hearing. Arthritic pains. Has difficulty buttoning his shirt. Problems with balance, particularly when changing position or moving quickly. No speech deficits. No spontaneous bleeding.  Physical Examination Filed Vitals:   03/29/13 1439  BP: 120/73  Pulse: 59   Filed Weights   03/29/13 1439  Weight: 181 lb 0.6 oz (82.119 kg)   Elderly male in no acute distress. HEENT: Conjunctiva and lids normal, oropharynx clear. Neck: Supple, no elevated JVP or carotid bruits, no thyromegaly. Lungs: Clear to auscultation, nonlabored breathing at rest. Cardiac: Irregularly irregular, no S3, soft systolic murmur, no pericardial rub. Abdomen: Soft, nontender, bowel sounds present. Extremities: Trace edema, distal pulses 2+. Skin: Warm and dry. Musculoskeletal: Kyphosis. Neuropsychiatric: Alert and oriented x3, hard of hearing.   Problem List and Plan   ATRIAL FIBRILLATION Permanent. Continue heart rate  control strategy, tolerating pindolol. He is on aspirin, at increased risk for bleeding with balance problems and recurrent falls.  CORONARY ATHEROSCLEROSIS NATIVE CORONARY ARTERY Clinically stable with multivessel disease status post CABG. Continue conservative management.  DIABETES Keep followup with [name].    [name], M.D., F.A.C.C.

## 2013-03-29 NOTE — Patient Instructions (Addendum)
Your physician recommends that you schedule a follow-up appointment in: one year  

## 2013-03-29 NOTE — Assessment & Plan Note (Signed)
Permanent. Continue heart rate control strategy, tolerating pindolol. He is on aspirin, at increased risk for bleeding with balance problems and recurrent falls.

## 2013-03-29 NOTE — Assessment & Plan Note (Signed)
Keep followup with Dr. Hall. 

## 2013-07-31 ENCOUNTER — Encounter (INDEPENDENT_AMBULATORY_CARE_PROVIDER_SITE_OTHER): Payer: Self-pay | Admitting: *Deleted

## 2013-08-13 ENCOUNTER — Ambulatory Visit (INDEPENDENT_AMBULATORY_CARE_PROVIDER_SITE_OTHER): Payer: Medicare Other | Admitting: Internal Medicine

## 2013-08-13 ENCOUNTER — Encounter (INDEPENDENT_AMBULATORY_CARE_PROVIDER_SITE_OTHER): Payer: Self-pay | Admitting: Internal Medicine

## 2013-08-13 VITALS — BP 110/50 | HR 64 | Temp 97.6°F | Ht 67.0 in | Wt 180.9 lb

## 2013-08-13 DIAGNOSIS — R1310 Dysphagia, unspecified: Secondary | ICD-10-CM

## 2013-08-13 NOTE — Progress Notes (Signed)
Subjective:     Patient ID: [name], male   DOB: 21-Apr-1918, 79 y.o.   MRN: [id number]  HPI Referred to our office by Dr. Margo Aye for dysphagia.  He tells me he was eating at [employer] and food lodged in his esophagus..  Red meats give him a problem. He has no problem eating shrimp. This has occurred several times then. He has to keep swallowing. He says he has had indigestion for years and Tums on a prn basis. He has had symptoms for about 5 months. Appetite is good. No weight loss. No abdominal pain. Usually has a BM daily. No melena or bright red rectal bleeding.   Hx of atrial fib and takes a Baby ASA every other day.  Review of Systems     Current Outpatient Prescriptions  Medication Sig Dispense Refill  . beta carotene w/minerals (OCUVITE) tablet Take 1 tablet by mouth daily.        Marland Kitchen dutasteride (AVODART) 0.5 MG capsule Take 0.5 mg by mouth daily.        Marland Kitchen ibuprofen (ADVIL,MOTRIN) 200 MG tablet Take 200 mg by mouth every 6 (six) hours as needed for pain.      . metFORMIN (GLUCOPHAGE) 500 MG tablet Take 500-1,000 mg by mouth 2 (two) times daily with a meal. Patient takes 2 tablets at lunch and 1 tablet at bedtime      . pindolol (VISKEN) 5 MG tablet Take 5 mg by mouth 2 (two) times daily.       . sitaGLIPtin (JANUVIA) 100 MG tablet Take 100 mg by mouth daily.        . valsartan (DIOVAN) 160 MG tablet Take 160 mg by mouth daily.        Marland Kitchen aspirin EC 81 MG tablet Take 81 mg by mouth every other day.        No current facility-administered medications for this visit.   Past Medical History  Diagnosis Date  . Coronary atherosclerosis of native coronary artery     Multivessel status post CABG  . Mixed hyperlipidemia   . Type 2 diabetes mellitus   . Atrial fibrillation   . Essential hypertension, benign    Past Surgical History  Procedure Laterality Date  . Tonsillectomy    . Cholecystectomy    . Knee arthroscopy    . Hernia repair    . Coronary artery bypass graft   1992    LIMA to LAD and diagonal, SVG to RCA, SVG to OM  . Back surgery     Allergies  Allergen Reactions  . Acetaminophen-Codeine   . Penicillins     Objective:   Physical Exam  Filed Vitals:   08/13/13 1055  Height: 5\' 7"  (1.702 m)  Weight: 180 lb 14.4 oz (82.056 kg)    Alert and oriented. Skin warm and dry. Oral mucosa is moist.   . Sclera anicteric, conjunctivae is pink. Thyroid not enlarged. No cervical lymphadenopathy. Lungs clear. Heart regular rate and rhythm.  Abdomen is soft. Bowel sounds are positive. No hepatomegaly. No abdominal masses felt. No tenderness.  No edema to lower extremities.     Assessment:    Dysphagia to solids. Esophageal stricture needs to be ruled out.     Plan:     DG esophgus. Avoid meats and breads. Further recommendations once we have xray back.

## 2013-08-13 NOTE — Patient Instructions (Addendum)
Pill esophagram.

## 2013-08-14 ENCOUNTER — Encounter (INDEPENDENT_AMBULATORY_CARE_PROVIDER_SITE_OTHER): Payer: Self-pay

## 2013-08-14 ENCOUNTER — Ambulatory Visit (HOSPITAL_COMMUNITY)
Admission: RE | Admit: 2013-08-14 | Discharge: 2013-08-14 | Disposition: A | Discharge status: Home / Self Care | Payer: Medicare Other | Source: Ambulatory Visit | Attending: Internal Medicine | Admitting: Internal Medicine

## 2013-08-14 DIAGNOSIS — R1310 Dysphagia, unspecified: Secondary | ICD-10-CM | POA: Insufficient documentation

## 2013-08-16 ENCOUNTER — Telehealth (INDEPENDENT_AMBULATORY_CARE_PROVIDER_SITE_OTHER): Payer: Self-pay | Admitting: Internal Medicine

## 2013-08-16 NOTE — Telephone Encounter (Signed)
No answer at home. Will try again

## 2013-08-19 ENCOUNTER — Telehealth (INDEPENDENT_AMBULATORY_CARE_PROVIDER_SITE_OTHER): Payer: Self-pay | Admitting: Internal Medicine

## 2013-08-19 ENCOUNTER — Other Ambulatory Visit (INDEPENDENT_AMBULATORY_CARE_PROVIDER_SITE_OTHER): Payer: Self-pay | Admitting: Internal Medicine

## 2013-08-19 DIAGNOSIS — R1310 Dysphagia, unspecified: Secondary | ICD-10-CM

## 2013-08-19 MED ORDER — PANTOPRAZOLE SODIUM 40 MG PO TBEC: 40.0000 mg | DELAYED_RELEASE_TABLET | Freq: Every day | ORAL | Status: DC

## 2013-08-20 ENCOUNTER — Encounter (INDEPENDENT_AMBULATORY_CARE_PROVIDER_SITE_OTHER): Payer: Self-pay | Admitting: *Deleted

## 2013-08-20 ENCOUNTER — Other Ambulatory Visit (INDEPENDENT_AMBULATORY_CARE_PROVIDER_SITE_OTHER): Payer: Self-pay | Admitting: *Deleted

## 2013-08-20 DIAGNOSIS — R1310 Dysphagia, unspecified: Secondary | ICD-10-CM

## 2013-08-20 NOTE — Telephone Encounter (Signed)
I have spoken with daughter. She will call back today

## 2013-09-12 ENCOUNTER — Encounter (HOSPITAL_COMMUNITY)
Admission: RE | Disposition: A | Discharge status: Home / Self Care | Payer: Self-pay | Source: Ambulatory Visit | Attending: Internal Medicine

## 2013-09-12 ENCOUNTER — Encounter (HOSPITAL_COMMUNITY): Payer: Self-pay

## 2013-09-12 ENCOUNTER — Ambulatory Visit (HOSPITAL_COMMUNITY)
Admission: RE | Admit: 2013-09-12 | Discharge: 2013-09-12 | Disposition: A | Discharge status: Home / Self Care | Payer: Medicare Other | Source: Ambulatory Visit | Attending: Internal Medicine | Admitting: Internal Medicine

## 2013-09-12 DIAGNOSIS — K449 Diaphragmatic hernia without obstruction or gangrene: Secondary | ICD-10-CM

## 2013-09-12 DIAGNOSIS — K2100 Gastro-esophageal reflux disease with esophagitis, without bleeding: Secondary | ICD-10-CM | POA: Insufficient documentation

## 2013-09-12 DIAGNOSIS — E119 Type 2 diabetes mellitus without complications: Secondary | ICD-10-CM | POA: Insufficient documentation

## 2013-09-12 DIAGNOSIS — K228 Other specified diseases of esophagus: Secondary | ICD-10-CM

## 2013-09-12 DIAGNOSIS — Z01812 Encounter for preprocedural laboratory examination: Secondary | ICD-10-CM | POA: Insufficient documentation

## 2013-09-12 DIAGNOSIS — R1310 Dysphagia, unspecified: Secondary | ICD-10-CM | POA: Insufficient documentation

## 2013-09-12 DIAGNOSIS — I10 Essential (primary) hypertension: Secondary | ICD-10-CM | POA: Insufficient documentation

## 2013-09-12 DIAGNOSIS — K2270 Barrett's esophagus without dysplasia: Secondary | ICD-10-CM | POA: Insufficient documentation

## 2013-09-12 HISTORY — PX: ESOPHAGOGASTRODUODENOSCOPY (EGD) WITH ESOPHAGEAL DILATION: SHX5812

## 2013-09-12 LAB — GLUCOSE, CAPILLARY: Glucose-Capillary: 123 mg/dL — ABNORMAL HIGH (ref 70–99)

## 2013-09-12 SURGERY — ESOPHAGOGASTRODUODENOSCOPY (EGD) WITH ESOPHAGEAL DILATION
Anesthesia: Moderate Sedation

## 2013-09-12 MED ORDER — MEPERIDINE HCL 50 MG/ML IJ SOLN
INTRAMUSCULAR | Status: AC
  Filled 2013-09-12: qty 1

## 2013-09-12 MED ORDER — MIDAZOLAM HCL 5 MG/5ML IJ SOLN
INTRAMUSCULAR | Status: DC | PRN
  Administered 2013-09-12: 2 mg via INTRAVENOUS

## 2013-09-12 MED ORDER — SODIUM CHLORIDE 0.9 % IV SOLN
INTRAVENOUS | Status: DC
  Administered 2013-09-12: 15:00:00 via INTRAVENOUS

## 2013-09-12 MED ORDER — BUTAMBEN-TETRACAINE-BENZOCAINE 2-2-14 % EX AERO
INHALATION_SPRAY | CUTANEOUS | Status: DC | PRN
  Administered 2013-09-12: 2 via TOPICAL

## 2013-09-12 MED ORDER — STERILE WATER FOR IRRIGATION IR SOLN
Status: DC | PRN
  Administered 2013-09-12: 15:00:00

## 2013-09-12 MED ORDER — MIDAZOLAM HCL 5 MG/5ML IJ SOLN
INTRAMUSCULAR | Status: AC
  Filled 2013-09-12: qty 10

## 2013-09-12 NOTE — H&P (Signed)
[name] is an 79 y.o. male.   Chief Complaint: Patient is here for EGD and ED. HPI: Patient is a 79 year old Caucasian male who presents with a five-month history of intermittent solid food dysphagia. He states bolus passes spontaneously after a few minutes and is able to go back to eating. He does give history of frequent heartburn has been using TUMS on when necessary basis. He states he has lost 40 pounds disease stop eating the foods which would not go down. He has good appetite. He denies melena or rectal bleeding. He had a barium esophagogram last month and no abnormality noted.  Past Medical History  Diagnosis Date  . Coronary atherosclerosis of native coronary artery     Multivessel status post CABG  . Mixed hyperlipidemia   . Type 2 diabetes mellitus   . Atrial fibrillation   . Essential hypertension, benign     Past Surgical History  Procedure Laterality Date  . Tonsillectomy    . Cholecystectomy    . Knee arthroscopy    . Hernia repair    . Coronary artery bypass graft  1992    LIMA to LAD and diagonal, SVG to RCA, SVG to OM  . Back surgery      Family History  Problem Relation Age of Onset  . Diabetes     Social History:  reports that he has never smoked. He does not have any smokeless tobacco history on file. He reports that he does not drink alcohol or use illicit drugs.  Allergies:  Allergies  Allergen Reactions  . Acetaminophen-Codeine   . Penicillins     Medications Prior to Admission  Medication Sig Dispense Refill  . aspirin EC 81 MG tablet Take 81 mg by mouth every other day.       . beta carotene w/minerals (OCUVITE) tablet Take 1 tablet by mouth daily.        Marland Kitchen dutasteride (AVODART) 0.5 MG capsule Take 0.5 mg by mouth daily.       Marland Kitchen ibuprofen (ADVIL,MOTRIN) 200 MG tablet Take 200 mg by mouth every 6 (six) hours as needed for pain.      . metFORMIN (GLUCOPHAGE) 500 MG tablet Take 500-1,000 mg by mouth 2 (two) times daily with a meal. Patient  takes 2 tablets at lunch and 1 tablet at bedtime      . pantoprazole (PROTONIX) 40 MG tablet Take 1 tablet (40 mg total) by mouth daily.  30 tablet  5  . pindolol (VISKEN) 5 MG tablet Take 5 mg by mouth 2 (two) times daily.       . sitaGLIPtin (JANUVIA) 100 MG tablet Take 100 mg by mouth daily.        . valsartan (DIOVAN) 160 MG tablet Take 160 mg by mouth daily.          Results for orders placed during the hospital encounter of 09/12/13 (from the past 48 hour(s))  GLUCOSE, CAPILLARY     Status: Abnormal   Collection Time    09/12/13  2:21 PM      Result Value Range   Glucose-Capillary 123 (*) 70 - 99 mg/dL   No results found.  ROS  Blood pressure 128/69, pulse 70, temperature 97.6 F (36.4 C), temperature source Oral, resp. rate 16, height 5\' 7"  (1.702 m), weight 180 lb (81.647 kg), SpO2 98.00%. Physical Exam  Constitutional: He appears well-developed and well-nourished.  HENT:  Mouth/Throat: Oropharynx is clear and moist.  Eyes: Conjunctivae are normal. No  scleral icterus.  Neck: No thyromegaly present.  Cardiovascular:  Irregular rhythm normal S1 and S2. No murmur or gallop noted.  Respiratory: Effort normal and breath sounds normal.  GI: Soft. He exhibits no distension and no mass. There is no tenderness.  Musculoskeletal: He exhibits no edema.  Lymphadenopathy:    He has no cervical adenopathy.  Neurological: He is alert.  Skin: Skin is warm and dry.     Assessment/Plan Solid food dysphagia. EGD an ED.  REHMAN,NAJEEB U 09/12/2013, 2:49 PM

## 2013-09-12 NOTE — Op Note (Signed)
EGD PROCEDURE REPORT  PATIENT:  [name]  MR#:  [id number] Birthdate:  [birth year], 79 y.o., male Endoscopist:  Dr. Malissa Hippo, MD Referred By:  Dr. Dwana Melena, MD Procedure Date: 09/12/2013  Procedure:   EGD with ED.  Indications:  Patient is a 79 year old Caucasian male with history of GERD who presents with solid food dysphagia. Recent barium pill esophagogram was unremarkable. He is undergoing diagnostic and therapeutic EGD.            Informed Consent:  The risks, benefits, alternatives & imponderables which include, but are not limited to, bleeding, infection, perforation, drug reaction and potential missed lesion have been reviewed.  The potential for biopsy, lesion removal, esophageal dilation, etc. have also been discussed.  Questions have been answered.  All parties agreeable.  Please see history & physical in medical record for more information.  Medications:  Versed 2 mg IV Cetacaine spray topically for oropharyngeal anesthesia  Description of procedure:  The endoscope was introduced through the mouth and advanced to the second portion of the duodenum without difficulty or limitations. The mucosal surfaces were surveyed very carefully during advancement of the scope and upon withdrawal.  Findings:  Esophagus:  Mucosa of proximal and middle second was normal. Single linear erosion noted the distal esophagus 3 cm proximal to GE junction. Single patch of salmon-colored mucosa noted a distal esophagus about 10 x 10 mm. GEJ:  38 cm Hiatus:  40 cm Stomach:  Stomach was empty and distended very well with insufflation. Folds in the proximal stomach are normal. Examination of mucosa and body revealed mosaic pattern. Antral mucosa was unremarkable. Pyloric channel was patent. Angularis fundus and cardia were also examined. Duodenum:  Normal bulbar and post bulbar mucosa.  Therapeutic/Diagnostic Maneuvers Performed:   Esophagus dilated by passing 54 French Maloney dilator to  full insertion but no mucosal disruption noted. Biopsy was taken a patch of salmon-colored mucosa at distal is in endoscope was withdrawn  Complications:  None  Impression: Erosive reflux esophagitis with small sliding heart hernia without ring or stricture. Single patch of salmon-colored mucosa at distal esophagus. Esophagus dilated by passing 54 [place] Maloney followed by esophageal biopsy.   Recommendations:  Continue anti-reflux measures and pantoprazole as before. Keep ibuprofen use to minimum. I will be contacting with biopsy results. Office visit in one month.  Dlisa Barnwell U  09/12/2013  3:13 PM  CC: Dr. Catalina Pizza, MD & Dr. Bonnetta Barry ref. provider found

## 2013-09-17 ENCOUNTER — Encounter (HOSPITAL_COMMUNITY): Payer: Self-pay | Admitting: Internal Medicine

## 2013-10-10 ENCOUNTER — Encounter (INDEPENDENT_AMBULATORY_CARE_PROVIDER_SITE_OTHER): Payer: Self-pay | Admitting: Internal Medicine

## 2013-10-10 ENCOUNTER — Ambulatory Visit (INDEPENDENT_AMBULATORY_CARE_PROVIDER_SITE_OTHER): Payer: Medicare Other | Admitting: Internal Medicine

## 2013-10-10 VITALS — BP 108/62 | HR 60 | Temp 97.7°F | Ht 67.0 in | Wt 182.1 lb

## 2013-10-10 DIAGNOSIS — K219 Gastro-esophageal reflux disease without esophagitis: Secondary | ICD-10-CM | POA: Insufficient documentation

## 2013-10-10 DIAGNOSIS — R1310 Dysphagia, unspecified: Secondary | ICD-10-CM

## 2013-10-10 DIAGNOSIS — K2270 Barrett's esophagus without dysplasia: Secondary | ICD-10-CM

## 2013-10-10 NOTE — Patient Instructions (Signed)
Continue the Protonix. OV in 1 yr. If any problems please call our office.

## 2013-10-10 NOTE — Progress Notes (Signed)
Subjective:     Patient ID: [name], male   DOB: Jun 06, 1918, 79 y.o.   MRN: [id number] Resident of the [facility] HPI Here today for f/u after recently undergoing an EGD/ED for dysphagia. He says he started to have diarrhea when the Protonix was started. Protonix stopped. Has been taking TUMs on a prn basis. Appetite has remained good. No weight loss. He does not have dysphagia.  09/12/2013 EGD: Dysphagia: Impression:  Erosive reflux esophagitis with small sliding heart hernia without ring or stricture.  Single patch of salmon-colored mucosa at distal esophagus.  Esophagus dilated by passing 54 [place] Maloney followed by esophageal biopsy.  Biopsy revealed intestinal metaplasia consistent with Barrett's esophagus. No dysplasia or malignancy identified. Negative for H. Pylori.      Review of Systems see hpi Current Outpatient Prescriptions  Medication Sig Dispense Refill  . aspirin EC 81 MG tablet Take 81 mg by mouth every other day.       . beta carotene w/minerals (OCUVITE) tablet Take 1 tablet by mouth daily.        Marland Kitchen dutasteride (AVODART) 0.5 MG capsule Take 0.5 mg by mouth daily.       Marland Kitchen ibuprofen (ADVIL,MOTRIN) 200 MG tablet Take 200 mg by mouth every 6 (six) hours as needed for pain.      . metFORMIN (GLUCOPHAGE) 500 MG tablet Take 500-1,000 mg by mouth 2 (two) times daily with a meal. Patient takes 2 tablets at lunch and 1 tablet at bedtime      . pantoprazole (PROTONIX) 40 MG tablet Take 1 tablet (40 mg total) by mouth daily.  30 tablet  5  . pindolol (VISKEN) 5 MG tablet Take 5 mg by mouth 2 (two) times daily.       . sitaGLIPtin (JANUVIA) 100 MG tablet Take 100 mg by mouth daily.        . valsartan (DIOVAN) 160 MG tablet Take 160 mg by mouth daily.         No current facility-administered medications for this visit.   Past Medical History  Diagnosis Date  . Coronary atherosclerosis of native coronary artery     Multivessel status post CABG  . Mixed  hyperlipidemia   . Type 2 diabetes mellitus   . Atrial fibrillation   . Essential hypertension, benign    Past Surgical History  Procedure Laterality Date  . Tonsillectomy    . Cholecystectomy    . Knee arthroscopy    . Hernia repair    . Coronary artery bypass graft  1992    LIMA to LAD and diagonal, SVG to RCA, SVG to OM  . Back surgery    . Esophagogastroduodenoscopy (egd) with esophageal dilation N/A 09/12/2013    Procedure: ESOPHAGOGASTRODUODENOSCOPY (EGD) WITH ESOPHAGEAL DILATION;  Surgeon: Malissa Hippo, MD;  Location: [facility];  Service: Endoscopy;  Laterality: N/A;   Allergies  Allergen Reactions  . Acetaminophen-Codeine   . Penicillins         Objective:   Physical Exam  Filed Vitals:   10/10/13 1035  BP: 108/62  Pulse: 60  Temp: 97.7 F (36.5 C)  Height: 5\' 7"  (1.702 m)  Weight: 182 lb 1.6 oz (82.6 kg)   Alert and oriented. Skin warm and dry. Oral mucosa is moist.   . Sclera anicteric, conjunctivae is pink. Thyroid not enlarged. No cervical lymphadenopathy. Lungs clear. Heart regular rate and rhythm.  Abdomen is soft. Bowel sounds are positive. No hepatomegaly. No abdominal masses felt.  No tenderness.  No edema to lower extremities. Patient is alert and oriented.     Assessment:    GERD, Barrett's esophagus. Protonix apparently caused diarrhea. He had to take PRN TUMs.  Dysphagia no problems swallowing at this time.  No weight loss.    Plan:     Will give an Rx for Dexilant since he is a resident of the [address]. Hopefully his insurance will pay for this   OV in 1 yr.    If any problems call our office.

## 2013-10-14 ENCOUNTER — Telehealth (INDEPENDENT_AMBULATORY_CARE_PROVIDER_SITE_OTHER): Payer: Self-pay | Admitting: Internal Medicine

## 2013-10-14 NOTE — Telephone Encounter (Signed)
Am going to start him on Omeprazole 40mg  30 minutes before breakfast. Order faxed to [facility].

## 2013-10-23 ENCOUNTER — Encounter (INDEPENDENT_AMBULATORY_CARE_PROVIDER_SITE_OTHER): Payer: Self-pay | Admitting: *Deleted

## 2013-12-05 ENCOUNTER — Emergency Department (HOSPITAL_COMMUNITY): Payer: Medicare Other

## 2013-12-05 ENCOUNTER — Encounter (HOSPITAL_COMMUNITY): Payer: Self-pay | Admitting: Emergency Medicine

## 2013-12-05 ENCOUNTER — Emergency Department (HOSPITAL_COMMUNITY)
Admission: EM | Admit: 2013-12-05 | Discharge: 2013-12-05 | Disposition: A | Discharge status: Home / Self Care | Payer: Medicare Other | Attending: Emergency Medicine | Admitting: Emergency Medicine

## 2013-12-05 DIAGNOSIS — Z7982 Long term (current) use of aspirin: Secondary | ICD-10-CM | POA: Insufficient documentation

## 2013-12-05 DIAGNOSIS — Z79899 Other long term (current) drug therapy: Secondary | ICD-10-CM | POA: Insufficient documentation

## 2013-12-05 DIAGNOSIS — W1809XA Striking against other object with subsequent fall, initial encounter: Secondary | ICD-10-CM | POA: Insufficient documentation

## 2013-12-05 DIAGNOSIS — R05 Cough: Secondary | ICD-10-CM | POA: Insufficient documentation

## 2013-12-05 DIAGNOSIS — S1093XA Contusion of unspecified part of neck, initial encounter: Principal | ICD-10-CM

## 2013-12-05 DIAGNOSIS — R059 Cough, unspecified: Secondary | ICD-10-CM | POA: Insufficient documentation

## 2013-12-05 DIAGNOSIS — Z9089 Acquired absence of other organs: Secondary | ICD-10-CM | POA: Insufficient documentation

## 2013-12-05 DIAGNOSIS — I10 Essential (primary) hypertension: Secondary | ICD-10-CM | POA: Insufficient documentation

## 2013-12-05 DIAGNOSIS — S0083XA Contusion of other part of head, initial encounter: Secondary | ICD-10-CM

## 2013-12-05 DIAGNOSIS — IMO0002 Reserved for concepts with insufficient information to code with codable children: Secondary | ICD-10-CM | POA: Insufficient documentation

## 2013-12-05 DIAGNOSIS — S0003XA Contusion of scalp, initial encounter: Secondary | ICD-10-CM | POA: Insufficient documentation

## 2013-12-05 DIAGNOSIS — Z951 Presence of aortocoronary bypass graft: Secondary | ICD-10-CM | POA: Insufficient documentation

## 2013-12-05 DIAGNOSIS — W19XXXA Unspecified fall, initial encounter: Secondary | ICD-10-CM

## 2013-12-05 DIAGNOSIS — Y9289 Other specified places as the place of occurrence of the external cause: Secondary | ICD-10-CM | POA: Insufficient documentation

## 2013-12-05 DIAGNOSIS — Y9389 Activity, other specified: Secondary | ICD-10-CM | POA: Insufficient documentation

## 2013-12-05 DIAGNOSIS — S7010XA Contusion of unspecified thigh, initial encounter: Secondary | ICD-10-CM | POA: Insufficient documentation

## 2013-12-05 DIAGNOSIS — E119 Type 2 diabetes mellitus without complications: Secondary | ICD-10-CM | POA: Insufficient documentation

## 2013-12-05 DIAGNOSIS — Z880 Allergy status to penicillin: Secondary | ICD-10-CM | POA: Insufficient documentation

## 2013-12-05 DIAGNOSIS — I2510 Atherosclerotic heart disease of native coronary artery without angina pectoris: Secondary | ICD-10-CM | POA: Insufficient documentation

## 2013-12-05 NOTE — Discharge Instructions (Signed)
As discussed, it is normal to feel worse in the days immediately following a fall regardless of medication use.  However, please take Tylenol as directed, use ice packs liberally.  If you develop any new, or concerning changes in your condition, please return here for further evaluation and management.    Otherwise, please return followup with your physician.

## 2013-12-05 NOTE — ED Notes (Signed)
[facility] here to [facility] pt.

## 2013-12-05 NOTE — ED Notes (Signed)
Pt alert & oriented x4, stable gait. Patient given discharge instructions, paperwork & prescription(s). Patient  instructed to stop at the registration desk to finish any additional paperwork. Patient verbalized understanding. Pt left department w/ no further questions. 

## 2013-12-05 NOTE — ED Notes (Signed)
Pt fell in parking lot, struck front lt side of forehead on curb. Pt denies LOC, hematoma and abraisions evident. FS 174, pt is unsteady on feet. [facility] at [facility], drove self to liquor store and fell in parking lot.

## 2013-12-05 NOTE — ED Provider Notes (Signed)
CSN: [id number]     Arrival date & time 12/05/13  1507 History  This chart was scribed for Kyle Munch, MD by Dorothey Baseman, ED Scribe. This patient was seen in room [facility] and the patient's care was started at 3:29 PM.    Chief Complaint  Patient presents with  . Fall   The history is provided by the patient. No language interpreter was used.   HPI Comments: OHM DENTLER is a 80 y.o. male who presents to the Emergency Department complaining of a fall that occurred earlier today when he states that he lost his balance, tripped, and struck the front, right side of his forehead on a concrete curb. He denies loss of consciousness. Patient uses a cane for daily ambulation. Patient has a hematoma and some abrasions to the left side of the forehead secondary to the incident. He reports an associated pain to the left, upper leg that he states feels like a bruise secondary to the incident, but denies any other pain or injury to the bilateral lower extremities. Patient reports that he has recently been ill with URI-like symptoms including a dry cough and has been taking Tessalon for it. He denies visual disturbance. Patient has a history of hyperlipidemia, HTN, DM, and atrial fibrillation.   Past Medical History  Diagnosis Date  . Coronary atherosclerosis of native coronary artery     Multivessel status post CABG  . Mixed hyperlipidemia   . Type 2 diabetes mellitus   . Atrial fibrillation   . Essential hypertension, benign    Past Surgical History  Procedure Laterality Date  . Tonsillectomy    . Cholecystectomy    . Knee arthroscopy    . Hernia repair    . Coronary artery bypass graft  1992    LIMA to LAD and diagonal, SVG to RCA, SVG to OM  . Back surgery    . Esophagogastroduodenoscopy (egd) with esophageal dilation N/A 09/12/2013    Procedure: ESOPHAGOGASTRODUODENOSCOPY (EGD) WITH ESOPHAGEAL DILATION;  Surgeon: Malissa Hippo, MD;  Location: [facility];  Service: Endoscopy;   Laterality: N/A;   Family History  Problem Relation Age of Onset  . Diabetes     History  Substance Use Topics  . Smoking status: Never Smoker   . Smokeless tobacco: Not on file  . Alcohol Use: No    Review of Systems  Constitutional:       Per HPI, otherwise negative  HENT:       Per HPI, otherwise negative  Eyes: Negative for visual disturbance.  Respiratory: Positive for cough.        Per HPI, otherwise negative  Cardiovascular:       Per HPI, otherwise negative  Gastrointestinal: Negative for vomiting.  Endocrine:       Negative aside from HPI  Genitourinary:       Neg aside from HPI   Musculoskeletal: Positive for myalgias.       Per HPI, otherwise negative  Skin: Positive for color change and wound.  Neurological: Negative for syncope.    Allergies  Acetaminophen-codeine and Penicillins  Home Medications   Current Outpatient Rx  Name  Route  Sig  Dispense  Refill  . aspirin EC 81 MG tablet   Oral   Take 81 mg by mouth every other day.          . beta carotene w/minerals (OCUVITE) tablet   Oral   Take 1 tablet by mouth daily.           Marland Kitchen  dutasteride (AVODART) 0.5 MG capsule   Oral   Take 0.5 mg by mouth daily.          Marland Kitchen. ibuprofen (ADVIL,MOTRIN) 200 MG tablet   Oral   Take 200 mg by mouth every 6 (six) hours as needed for pain.         . metFORMIN (GLUCOPHAGE) 500 MG tablet   Oral   Take 500-1,000 mg by mouth 2 (two) times daily with a meal. Patient takes 2 tablets at lunch and 1 tablet at bedtime         . pindolol (VISKEN) 5 MG tablet   Oral   Take 5 mg by mouth 2 (two) times daily.          . sitaGLIPtin (JANUVIA) 100 MG tablet   Oral   Take 100 mg by mouth daily.           . valsartan (DIOVAN) 160 MG tablet   Oral   Take 160 mg by mouth daily.            Triage Vitals: BP 140/67  Pulse 66  Resp 18  SpO2 96%  Physical Exam  Nursing note and vitals reviewed. Constitutional: He is oriented to person, place, and  time. He appears well-developed. No distress.  HENT:  Head: Normocephalic.  20 cm abrasion across the forehead with a focal hematoma on the left brow, about 6 cm in diameter.   Eyes: Conjunctivae and EOM are normal.  Cardiovascular: Normal rate and regular rhythm.   Pulmonary/Chest: Effort normal. No stridor. No respiratory distress.  Abdominal: He exhibits no distension.  Musculoskeletal: He exhibits no edema.  Neurological: He is alert and oriented to person, place, and time.  Skin: Skin is warm and dry.  Psychiatric: He has a normal mood and affect.    ED Course  Procedures (including critical care time)  The patient had irrigation, cleansing of his wounds, application of ice and dressings applied to the head abrasion, and abrasion.   DIAGNOSTIC STUDIES: Oxygen Saturation is 96% on room air, normal by my interpretation.    COORDINATION OF CARE: 3:36 PM- Will order CTs of the head and maxillofacial. Will order a wound irrigation. Discussed treatment plan with patient at bedside and patient verbalized agreement.   4:38 PM- Discussed that CT results were negative. Advised patient to use Tylenol and ice at home to manage symptoms. Discussed treatment plan with patient at bedside and patient verbalized agreement.    Labs Review Labs Reviewed - No data to display  Imaging Review Ct Head Wo Contrast  12/05/2013   CLINICAL DATA:  Larey SeatFell in parking lot with hematoma left forehead  EXAM: CT HEAD WITHOUT CONTRAST  CT MAXILLOFACIAL WITHOUT CONTRAST  TECHNIQUE: Multidetector CT imaging of the head and maxillofacial structures were performed using the standard protocol without intravenous contrast. Multiplanar CT image reconstructions of the maxillofacial structures were also generated.  COMPARISON:  None.  FINDINGS: CT HEAD FINDINGS  Moderate diffuse atrophy. Mild low attenuation in the periventricular white matter. Left occipital lobe encephalomalacia suggesting prior infarct. No acute vascular  territory infarct. No hemorrhage or extra-axial fluid intracranially. Moderate left frontal scalp hematoma. No skull fracture.  CT MAXILLOFACIAL FINDINGS  Moderate circumferential mucosal periosteal thickening left maxillary sinus. Several posterior left ethmoid air cells are opacified with mild inflammatory change in a few anterior left ethmoid air cells and in the inferior frontal sinus on the left. Minimal left sphenoid sinus inflammation also detected. The sinuses are otherwise clear.  There is a left frontal scalp hematoma. There is no evidence of facial bone fracture. The walls of the orbits are intact. There is carotid artery calcification bilaterally.  IMPRESSION: No acute intracranial traumatic injury.  Left scalp hematoma.  No skull or facial bone fractures.  Chronic sinusitis.   Electronically Signed   By: Esperanza Heir M.D.   On: 12/05/2013 16:31   Ct Maxillofacial Wo Cm  12/05/2013   CLINICAL DATA:  Larey Seat in parking lot with hematoma left forehead  EXAM: CT HEAD WITHOUT CONTRAST  CT MAXILLOFACIAL WITHOUT CONTRAST  TECHNIQUE: Multidetector CT imaging of the head and maxillofacial structures were performed using the standard protocol without intravenous contrast. Multiplanar CT image reconstructions of the maxillofacial structures were also generated.  COMPARISON:  None.  FINDINGS: CT HEAD FINDINGS  Moderate diffuse atrophy. Mild low attenuation in the periventricular white matter. Left occipital lobe encephalomalacia suggesting prior infarct. No acute vascular territory infarct. No hemorrhage or extra-axial fluid intracranially. Moderate left frontal scalp hematoma. No skull fracture.  CT MAXILLOFACIAL FINDINGS  Moderate circumferential mucosal periosteal thickening left maxillary sinus. Several posterior left ethmoid air cells are opacified with mild inflammatory change in a few anterior left ethmoid air cells and in the inferior frontal sinus on the left. Minimal left sphenoid sinus inflammation also  detected. The sinuses are otherwise clear. There is a left frontal scalp hematoma. There is no evidence of facial bone fracture. The walls of the orbits are intact. There is carotid artery calcification bilaterally.  IMPRESSION: No acute intracranial traumatic injury.  Left scalp hematoma.  No skull or facial bone fractures.  Chronic sinusitis.   Electronically Signed   By: Esperanza Heir M.D.   On: 12/05/2013 16:31    EKG Interpretation   None         MDM   1. Fall, initial encounter   2. Hematoma of face, initial encounter     Patient presents of a mechanical fall.  Patient has a notable hematoma on his face.  Given his age, the traumatic findings, imaging was performed.  This was largely reassuring.  Patient remained hemodynamically stable, in no distress throughout his emergency department course.  There is low suspicion for occult intracranial hemorrhage or fracture.  Patient was appropriate for discharge with outpatient followup as needed after his wounds were dressed.   Kyle Munch, MD 12/05/13 [phone]

## 2013-12-20 ENCOUNTER — Encounter (HOSPITAL_COMMUNITY): Payer: Self-pay | Admitting: Emergency Medicine

## 2013-12-20 ENCOUNTER — Emergency Department (HOSPITAL_COMMUNITY)
Admission: EM | Admit: 2013-12-20 | Discharge: 2013-12-20 | Disposition: A | Discharge status: Skilled Nursing Facility | Payer: Medicare Other | Attending: Emergency Medicine | Admitting: Emergency Medicine

## 2013-12-20 DIAGNOSIS — Z951 Presence of aortocoronary bypass graft: Secondary | ICD-10-CM | POA: Insufficient documentation

## 2013-12-20 DIAGNOSIS — Z880 Allergy status to penicillin: Secondary | ICD-10-CM | POA: Insufficient documentation

## 2013-12-20 DIAGNOSIS — E119 Type 2 diabetes mellitus without complications: Secondary | ICD-10-CM | POA: Insufficient documentation

## 2013-12-20 DIAGNOSIS — Z79899 Other long term (current) drug therapy: Secondary | ICD-10-CM | POA: Insufficient documentation

## 2013-12-20 DIAGNOSIS — I2510 Atherosclerotic heart disease of native coronary artery without angina pectoris: Secondary | ICD-10-CM | POA: Insufficient documentation

## 2013-12-20 DIAGNOSIS — I10 Essential (primary) hypertension: Secondary | ICD-10-CM | POA: Insufficient documentation

## 2013-12-20 DIAGNOSIS — R040 Epistaxis: Secondary | ICD-10-CM | POA: Insufficient documentation

## 2013-12-20 DIAGNOSIS — Z7982 Long term (current) use of aspirin: Secondary | ICD-10-CM | POA: Insufficient documentation

## 2013-12-20 LAB — CBC
HCT: 35.5 % — ABNORMAL LOW (ref 39.0–52.0)
Hemoglobin: 12.1 g/dL — ABNORMAL LOW (ref 13.0–17.0)
MCH: 31.7 pg (ref 26.0–34.0)
MCHC: 34.1 g/dL (ref 30.0–36.0)
MCV: 92.9 fL (ref 78.0–100.0)
PLATELETS: 169 10*3/uL (ref 150–400)
RBC: 3.82 MIL/uL — ABNORMAL LOW (ref 4.22–5.81)
RDW: 13.6 % (ref 11.5–15.5)
WBC: 8.6 10*3/uL (ref 4.0–10.5)

## 2013-12-20 MED ORDER — SILVER NITRATE-POT NITRATE 75-25 % EX MISC
1.0000 | Freq: Once | CUTANEOUS | Status: AC
  Administered 2013-12-20: 1 via TOPICAL

## 2013-12-20 MED ORDER — OXYMETAZOLINE HCL 0.05 % NA SOLN
1.0000 | Freq: Once | NASAL | Status: AC
  Administered 2013-12-20: 1 via NASAL

## 2013-12-20 MED ORDER — OXYMETAZOLINE HCL 0.05 % NA SOLN
NASAL | Status: AC
  Filled 2013-12-20: qty 15

## 2013-12-20 MED ORDER — SILVER NITRATE-POT NITRATE 75-25 % EX MISC
CUTANEOUS | Status: AC
  Filled 2013-12-20: qty 1

## 2013-12-20 NOTE — ED Notes (Signed)
Rhinorocket placed by EDP, pt does continue to have small amount of bloody trickle

## 2013-12-20 NOTE — ED Notes (Signed)
Spoke with Huntley DecSara, RN at [facility].  Reviewed discharge instructions. Verbalized understanding.  Facility to send someone to provide transportation.

## 2013-12-20 NOTE — ED Notes (Signed)
Pt to department via EMS from [name].  Pt has nosebleed that started approximately 20 minutes ago.  Ice pack applied.

## 2013-12-20 NOTE — ED Provider Notes (Signed)
CSN: [id number]     Arrival date & time 12/20/13  0217 History   First MD Initiated Contact with Patient 12/20/13 [phone]       HPI Patient reports epistaxis that began approximately 35 minutes prior to arrival.  Patient has been unable to control this at home.  Patient presents with pressure being held and partial control his bleeding at this time.  No history significant nosebleeds.  The patient is on an 81 mg aspirin but otherwise no anticoagulants.  No recent illness.  No recent easy bruising noted.   Past Medical History  Diagnosis Date  . Coronary atherosclerosis of native coronary artery     Multivessel status post CABG  . Mixed hyperlipidemia   . Type 2 diabetes mellitus   . Atrial fibrillation   . Essential hypertension, benign    Past Surgical History  Procedure Laterality Date  . Tonsillectomy    . Cholecystectomy    . Knee arthroscopy    . Hernia repair    . Coronary artery bypass graft  1992    LIMA to LAD and diagonal, SVG to RCA, SVG to OM  . Back surgery    . Esophagogastroduodenoscopy (egd) with esophageal dilation N/A 09/12/2013    Procedure: ESOPHAGOGASTRODUODENOSCOPY (EGD) WITH ESOPHAGEAL DILATION;  Surgeon: Malissa Hippo, MD;  Location: [facility];  Service: Endoscopy;  Laterality: N/A;   Family History  Problem Relation Age of Onset  . Diabetes     History  Substance Use Topics  . Smoking status: Never Smoker   . Smokeless tobacco: Not on file  . Alcohol Use: No    Review of Systems  All other systems reviewed and are negative.    Allergies  Acetaminophen-codeine and Penicillins  Home Medications   Current Outpatient Rx  Name  Route  Sig  Dispense  Refill  . aspirin EC 81 MG tablet   Oral   Take 81 mg by mouth every other day.          . benzonatate (TESSALON) 200 MG capsule   Oral   Take 1 capsule by mouth every 8 (eight) hours as needed. cough         . beta carotene w/minerals (OCUVITE) tablet   Oral   Take 1 tablet by mouth  daily.           Marland Kitchen dutasteride (AVODART) 0.5 MG capsule   Oral   Take 0.5 mg by mouth daily.          Marland Kitchen ibuprofen (ADVIL,MOTRIN) 200 MG tablet   Oral   Take 200 mg by mouth every 6 (six) hours as needed for pain.         Marland Kitchen omeprazole (PRILOSEC) 40 MG capsule   Oral   Take 40 mg by mouth daily.         . sitaGLIPtin (JANUVIA) 100 MG tablet   Oral   Take 100 mg by mouth daily.           . valsartan (DIOVAN) 160 MG tablet   Oral   Take 160 mg by mouth daily.            BP 119/67  Pulse 71  Temp(Src) 98.5 F (36.9 C) (Oral)  Resp 18  SpO2 97% Physical Exam  Nursing note and vitals reviewed. Constitutional: He is oriented to person, place, and time. He appears well-developed and well-nourished.  HENT:  Head: Normocephalic and atraumatic.  Significant bleeding coming from the right nare uncontrolled by  direct pressure.  Small amount of blood coming from the left nare. Small amount of blood in the posterior pharynx  Eyes: EOM are normal.  Neck: Normal range of motion.  Cardiovascular: Normal rate, regular rhythm, normal heart sounds and intact distal pulses.   Pulmonary/Chest: Effort normal and breath sounds normal. No respiratory distress.  Abdominal: Soft. He exhibits no distension. There is no tenderness.  Musculoskeletal: Normal range of motion.  Neurological: He is alert and oriented to person, place, and time.  Skin: Skin is warm and dry.  Psychiatric: He has a normal mood and affect. Judgment normal.    ED Course  EPISTAXIS MANAGEMENT Performed by: Lyanne CoAMPOS, Kalli Greenfield M Authorized by: Lyanne CoAMPOS, Ozan Maclay M Consent: Verbal consent obtained. Risks and benefits: risks, benefits and alternatives were discussed Consent given by: patient Patient sedated: no Treatment site: right anterior Repair method: nasal balloon Post-procedure assessment: bleeding stopped Treatment complexity: simple   EPISTAXIS MANAGEMENT Date/Time: 12/20/2013  Performed by: Lyanne CoAMPOS, Chadwin Fury  M Authorized by: Lyanne CoAMPOS, Vee Bahe M Consent: Verbal consent obtained. Risks and benefits: risks, benefits and alternatives were discussed Consent given by: patient Patient sedated: no Treatment site: LEFT anterior Repair method: nasal balloon Post-procedure assessment: bleeding stopped Treatment complexity: simple    Labs Review Labs Reviewed  CBC - Abnormal; Notable for the following:    RBC 3.82 (*)    Hemoglobin 12.1 (*)    HCT 35.5 (*)    All other components within normal limits   Imaging Review No results found.  EKG Interpretation   None       MDM   1. Right-sided epistaxis   2. Left-sided epistaxis    Majority of all bleeding controlled.  The patient still has very faint occasional bleed coming around the right nasal balloon.  This is just a very small occasional trickle.  I discussed the case with Dr. Pollyann Kennedyosen of ENT who agrees that he or one of his partners will be able to evaluate the patient in the ENT office later today.  The patient believes he will have a ride available to him.  Until then he'll continue ice packs at home    Lyanne CoKevin M Britny Riel, MD 12/20/13 [phone]

## 2013-12-21 ENCOUNTER — Encounter (HOSPITAL_COMMUNITY): Payer: Self-pay | Admitting: Emergency Medicine

## 2013-12-21 ENCOUNTER — Emergency Department (HOSPITAL_COMMUNITY)
Admission: EM | Admit: 2013-12-21 | Discharge: 2013-12-21 | Disposition: A | Discharge status: Home / Self Care | Payer: Medicare Other | Attending: Emergency Medicine | Admitting: Emergency Medicine

## 2013-12-21 DIAGNOSIS — Z7982 Long term (current) use of aspirin: Secondary | ICD-10-CM | POA: Insufficient documentation

## 2013-12-21 DIAGNOSIS — Z792 Long term (current) use of antibiotics: Secondary | ICD-10-CM | POA: Insufficient documentation

## 2013-12-21 DIAGNOSIS — Z880 Allergy status to penicillin: Secondary | ICD-10-CM | POA: Insufficient documentation

## 2013-12-21 DIAGNOSIS — I2510 Atherosclerotic heart disease of native coronary artery without angina pectoris: Secondary | ICD-10-CM | POA: Insufficient documentation

## 2013-12-21 DIAGNOSIS — E119 Type 2 diabetes mellitus without complications: Secondary | ICD-10-CM | POA: Insufficient documentation

## 2013-12-21 DIAGNOSIS — I10 Essential (primary) hypertension: Secondary | ICD-10-CM | POA: Insufficient documentation

## 2013-12-21 DIAGNOSIS — R040 Epistaxis: Secondary | ICD-10-CM

## 2013-12-21 DIAGNOSIS — Z951 Presence of aortocoronary bypass graft: Secondary | ICD-10-CM | POA: Insufficient documentation

## 2013-12-21 DIAGNOSIS — Z79899 Other long term (current) drug therapy: Secondary | ICD-10-CM | POA: Insufficient documentation

## 2013-12-21 MED ORDER — LIDOCAINE-EPINEPHRINE (PF) 2 %-1:200000 IJ SOLN
INTRAMUSCULAR | Status: AC
  Administered 2013-12-21: 06:00:00
  Filled 2013-12-21: qty 20

## 2013-12-21 MED ORDER — TRAMADOL HCL 50 MG PO TABS
50.0000 mg | ORAL_TABLET | Freq: Once | ORAL | Status: AC
  Administered 2013-12-21: 50 mg via ORAL
  Filled 2013-12-21: qty 1

## 2013-12-21 MED ORDER — TRAMADOL HCL 50 MG PO TABS: 25.0000 mg | ORAL_TABLET | Freq: Four times a day (QID) | ORAL | Status: DC | PRN

## 2013-12-21 MED ORDER — CLINDAMYCIN HCL 150 MG PO CAPS: 150.0000 mg | ORAL_CAPSULE | Freq: Three times a day (TID) | ORAL | Status: DC

## 2013-12-21 MED ORDER — CLINDAMYCIN HCL 150 MG PO CAPS
150.0000 mg | ORAL_CAPSULE | Freq: Once | ORAL | Status: AC
  Administered 2013-12-21: 150 mg via ORAL
  Filled 2013-12-21: qty 1

## 2013-12-21 MED ORDER — SILVER NITRATE-POT NITRATE 75-25 % EX MISC
CUTANEOUS | Status: AC
  Administered 2013-12-21: 06:00:00
  Filled 2013-12-21: qty 1

## 2013-12-21 NOTE — ED Provider Notes (Signed)
CSN: [id number]     Arrival date & time 12/21/13  [id number] History   First MD Initiated Contact with Patient 12/21/13 [phone]     Chief Complaint  Patient presents with  . Epistaxis   (Consider location/radiation/quality/duration/timing/severity/associated sxs/prior Treatment) HPI Comments: 80 year old male with a history of epistaxis onset yesterday, controlled with nasal packing in emergency department and sent to the office for followup where the nasal packing was removed. Epistaxis had been under control until just prior to arrival this evening when the bleeding recurred. This was brisk, unable to be controlled with direct pressure, patient has tried to blow his nose but is unable to remove the blood clot from the right and the bleeding from the left. He denies lightheadedness or shortness of breath. He is on aspirin  Patient is a 80 y.o. male presenting with nosebleeds. The history is provided by the patient and medical records.  Epistaxis   Past Medical History  Diagnosis Date  . Coronary atherosclerosis of native coronary artery     Multivessel status post CABG  . Mixed hyperlipidemia   . Type 2 diabetes mellitus   . Atrial fibrillation   . Essential hypertension, benign    Past Surgical History  Procedure Laterality Date  . Tonsillectomy    . Cholecystectomy    . Knee arthroscopy    . Hernia repair    . Coronary artery bypass graft  1992    LIMA to LAD and diagonal, SVG to RCA, SVG to OM  . Back surgery    . Esophagogastroduodenoscopy (egd) with esophageal dilation N/A 09/12/2013    Procedure: ESOPHAGOGASTRODUODENOSCOPY (EGD) WITH ESOPHAGEAL DILATION;  Surgeon: Malissa HippoNajeeb U Rehman, MD;  [facility]: [facility];  Service: Endoscopy;  Laterality: N/A;   Family History  Problem Relation Age of Onset  . Diabetes     History  Substance Use Topics  . Smoking status: Never Smoker   . Smokeless tobacco: Not on file  . Alcohol Use: No    Review of Systems  HENT: Positive for  nosebleeds.   Respiratory: Negative for shortness of breath.   Gastrointestinal: Negative for nausea and vomiting.    Allergies  Acetaminophen-codeine and Penicillins  Home Medications   Current Outpatient Rx  Name  Route  Sig  Dispense  Refill  . aspirin EC 81 MG tablet   Oral   Take 81 mg by mouth every other day.          . benzonatate (TESSALON) 200 MG capsule   Oral   Take 1 capsule by mouth every 8 (eight) hours as needed. cough         . beta carotene w/minerals (OCUVITE) tablet   Oral   Take 1 tablet by mouth daily.           . clindamycin (CLEOCIN) 150 MG capsule   Oral   Take 1 capsule (150 mg total) by mouth 3 (three) times daily.   21 capsule   0   . dutasteride (AVODART) 0.5 MG capsule   Oral   Take 0.5 mg by mouth daily.          Marland Kitchen. ibuprofen (ADVIL,MOTRIN) 200 MG tablet   Oral   Take 200 mg by mouth every 6 (six) hours as needed for pain.         Marland Kitchen. omeprazole (PRILOSEC) 40 MG capsule   Oral   Take 40 mg by mouth daily.         . sitaGLIPtin (JANUVIA) 100 MG  tablet   Oral   Take 100 mg by mouth daily.           . traMADol (ULTRAM) 50 MG tablet   Oral   Take 0.5 tablets (25 mg total) by mouth every 6 (six) hours as needed.   15 tablet   0   . valsartan (DIOVAN) 160 MG tablet   Oral   Take 160 mg by mouth daily.            BP 113/61  Pulse 82  Temp(Src) 98.6 F (37 C) (Oral)  Resp 20  Wt 183 lb (83.008 kg)  SpO2 97% Physical Exam  Nursing note and vitals reviewed. Constitutional: He appears well-developed and well-nourished. No distress.  HENT:  Head: Normocephalic and atraumatic.  Mouth/Throat: No oropharyngeal exudate.  Right near with blood clot and brisk bleeding, left knee with occasional trickle. Oropharynx with blood clot present  Eyes: Conjunctivae are normal. No scleral icterus.  Neck: Normal range of motion. Neck supple. No thyromegaly present.  Cardiovascular: Normal rate.   Irregularly irregular rhythm   Pulmonary/Chest: Effort normal and breath sounds normal.  Lymphadenopathy:    He has no cervical adenopathy.  Neurological: He is alert.  Skin: Skin is warm and dry. No rash noted. He is not diaphoretic.    ED Course  Procedures (including critical care time) Labs Review Labs Reviewed - No data to display Imaging Review No results found.  EKG Interpretation   None       MDM   1. Epistaxis, recurrent    Epistaxis present, patient will need to have packing replaced and re\re followup with ENT.  Anterior packing placed, the bleeding continued throughout the right side and the L nare as well as the oropharynx.  This continued despite packing placement.  Posterior packs were then placed with some improvement.  EPISTAXIS MANAGEMENT Date/Time: 0530 Performed by: Eber Hong, MD authorized by: Eber Hong, MD Consent: Verbal consent obtained. Risks and benefits: risks, benefits and alternatives were discussed Consent given by: patient  Patient sedated: no Treatment site: R nare Repair method: baloon - 7.5cm Post-procedure assessment: bleeding stopped Treatment complexity: simple  6:45 AM, reevaluated with no further bleeding, patient will be given antibiotics and pain medication, encouraged to followup with ENT on Monday. At this time his oropharynx is clear, he does not appear to have any further posterior or anterior movement of blood.  Meds given in ED:  Medications  clindamycin (CLEOCIN) capsule 150 mg (not administered)  traMADol (ULTRAM) tablet 50 mg (not administered)  silver nitrate applicators 75-25 % applicator (  Given by Other 12/21/13 0551)  lidocaine-EPINEPHrine (XYLOCAINE W/EPI) 2 %-1:200000 (PF) injection (  Given 12/21/13 0552)    New Prescriptions   CLINDAMYCIN (CLEOCIN) 150 MG CAPSULE    Take 1 capsule (150 mg total) by mouth 3 (three) times daily.   TRAMADOL (ULTRAM) 50 MG TABLET    Take 0.5 tablets (25 mg total) by mouth every 6 (six) hours as  needed.      Vida Roller, MD 12/21/13 [phone]

## 2013-12-21 NOTE — ED Notes (Signed)
Sp[facility] Marcel[facility]le [facility] and she states that she will not be able to send anyone for the patient until about an hour.

## 2013-12-21 NOTE — Discharge Instructions (Signed)
Please call your doctor for a followup appointment within 24-48 hours. When you talk to your doctor please let them know that you were seen in the emergency department and have them acquire all of your records so that they can discuss the findings with you and formulate a treatment plan to fully care for your new and ongoing problems. ° °

## 2013-12-21 NOTE — ED Notes (Signed)
EDP requesting nosebleed tray, lidocaine, and silver nitrate stick. Pt has packing in right nare and guaze in left nare.

## 2013-12-21 NOTE — ED Notes (Signed)
EDP in to check on pt several times and nose is still bleeding. Overheard EDP say that he would be back in to see pt.

## 2013-12-21 NOTE — ED Notes (Signed)
[facility] and [facility] will be here in 30 min. To get the Pt.  Nurse informed.

## 2013-12-21 NOTE — ED Notes (Addendum)
NOSEBLEED 30 MINS PTA, SEEN HERE YESTERDAY FOR SAME.

## 2013-12-21 NOTE — ED Notes (Signed)
With Marcelino DusterMichelle again from [facility] and she stated that they were busy and that she would send somebody to get patient as soon as she could

## 2013-12-21 NOTE — ED Notes (Signed)
Patient provided with breakfast

## 2014-03-10 ENCOUNTER — Other Ambulatory Visit (INDEPENDENT_AMBULATORY_CARE_PROVIDER_SITE_OTHER): Payer: Self-pay | Admitting: Internal Medicine

## 2014-03-19 ENCOUNTER — Other Ambulatory Visit (INDEPENDENT_AMBULATORY_CARE_PROVIDER_SITE_OTHER): Payer: Self-pay | Admitting: Internal Medicine

## 2014-03-19 DIAGNOSIS — K219 Gastro-esophageal reflux disease without esophagitis: Secondary | ICD-10-CM

## 2014-05-11 ENCOUNTER — Encounter (HOSPITAL_COMMUNITY): Payer: Self-pay | Admitting: Emergency Medicine

## 2014-05-11 ENCOUNTER — Emergency Department (HOSPITAL_COMMUNITY)
Admission: EM | Admit: 2014-05-11 | Discharge: 2014-05-11 | Disposition: A | Discharge status: Home / Self Care | Payer: Medicare Other | Attending: Emergency Medicine | Admitting: Emergency Medicine

## 2014-05-11 DIAGNOSIS — Z951 Presence of aortocoronary bypass graft: Secondary | ICD-10-CM | POA: Insufficient documentation

## 2014-05-11 DIAGNOSIS — Z79899 Other long term (current) drug therapy: Secondary | ICD-10-CM | POA: Insufficient documentation

## 2014-05-11 DIAGNOSIS — I2510 Atherosclerotic heart disease of native coronary artery without angina pectoris: Secondary | ICD-10-CM | POA: Insufficient documentation

## 2014-05-11 DIAGNOSIS — I10 Essential (primary) hypertension: Secondary | ICD-10-CM | POA: Insufficient documentation

## 2014-05-11 DIAGNOSIS — B029 Zoster without complications: Secondary | ICD-10-CM | POA: Insufficient documentation

## 2014-05-11 DIAGNOSIS — Z880 Allergy status to penicillin: Secondary | ICD-10-CM | POA: Insufficient documentation

## 2014-05-11 DIAGNOSIS — E119 Type 2 diabetes mellitus without complications: Secondary | ICD-10-CM | POA: Insufficient documentation

## 2014-05-11 DIAGNOSIS — Z7982 Long term (current) use of aspirin: Secondary | ICD-10-CM | POA: Insufficient documentation

## 2014-05-11 MED ORDER — PREDNISONE 20 MG PO TABS
40.0000 mg | ORAL_TABLET | Freq: Once | ORAL | Status: AC
  Administered 2014-05-11: 40 mg via ORAL
  Filled 2014-05-11: qty 2

## 2014-05-11 MED ORDER — VALACYCLOVIR HCL 1 G PO TABS: 1000.0000 mg | ORAL_TABLET | Freq: Three times a day (TID) | ORAL | Status: AC

## 2014-05-11 MED ORDER — VALACYCLOVIR HCL 500 MG PO TABS
1000.0000 mg | ORAL_TABLET | Freq: Once | ORAL | Status: AC
  Administered 2014-05-11: 1000 mg via ORAL
  Filled 2014-05-11: qty 2

## 2014-05-11 NOTE — ED Notes (Signed)
Blistery Rash to right side since Friday

## 2014-05-11 NOTE — Discharge Instructions (Signed)
Shingles Shingles is caused by the same virus that causes chickenpox. The first feelings may be pain or tingling. A rash will follow in a couple days. The rash may occur on any area of the body. Long-lasting pain is more likely in an elderly person. It can last months to years. There are medicines that can help prevent pain if you start taking them early. HOME CARE   Place cool cloths on the rash.  Only take medicine as told by your doctor.  You may use calamine lotion to relieve itchy skin.  Avoid touching:  Babies.  Children with inflamed skin (eczema).  People who have gotten transplanted organs.  People with chronic illnesses, such as leukemia and AIDS.  If the rash is on the face, you may need to see a specialist. Keep all appointments. Shingles must be kept away from the eyes, if possible.  Keep all appointments.  Avoid touching the eyes or eye area, if possible. GET HELP RIGHT AWAY IF:   You have any pain on the face or eye.  Your medicines do not help.  Your redness or puffiness (swelling) spreads.  You have a fever.  You notice any red lines going away from the rash area. MAKE SURE YOU:   Understand these instructions.  Will watch your condition.  Will get help right away if you are not doing well or get worse. Document Released: 05/02/2008 Document Revised: 02/06/2012 Document Reviewed: 05/02/2008 [facility] Patient Information 2014 [employer] [place].  Prescription for antiviral and prednisone. Followup your primary care Dr.

## 2014-05-11 NOTE — ED Provider Notes (Signed)
CSN: [id number]     Arrival date & time 05/11/14  1321 History  This chart was scribed for Kyle HutchingBrian Sydney Azure, MD by Karle PlumberJennifer Tensley, ED Scribe. This patient was seen in room APA04/APA04 and the patient's care was started at 1:45 PM.  Chief Complaint  Patient presents with  . Rash   The history is provided by the patient. No language interpreter was used.   HPI Comments:  Stormy CardGeorge W Klindt is a 80 y.o. male with h/o DM, CAD, and atrial fibrillation who presents to the Emergency Department complaining of a red, blistery and spreading rash starting on his right side extending to his back that started two days ago.   No fever, chills, neurological deficits.  Severity is mild. Nothing makes symptoms better or worse.  Past Medical History  Diagnosis Date  . Coronary atherosclerosis of native coronary artery     Multivessel status post CABG  . Mixed hyperlipidemia   . Type 2 diabetes mellitus   . Atrial fibrillation   . Essential hypertension, benign    Past Surgical History  Procedure Laterality Date  . Tonsillectomy    . Cholecystectomy    . Knee arthroscopy    . Hernia repair    . Coronary artery bypass graft  1992    LIMA to LAD and diagonal, SVG to RCA, SVG to OM  . Back surgery    . Esophagogastroduodenoscopy (egd) with esophageal dilation N/A 09/12/2013    Procedure: ESOPHAGOGASTRODUODENOSCOPY (EGD) WITH ESOPHAGEAL DILATION;  Surgeon: Malissa HippoNajeeb U Rehman, MD;  [facility];  Service: Endoscopy;  Laterality: N/A;   Family History  Problem Relation Age of Onset  . Diabetes     History  Substance Use Topics  . Smoking status: Never Smoker   . Smokeless tobacco: Not on file  . Alcohol Use: No    Review of Systems  A complete 10 system review of systems was obtained and all systems are negative except as noted in the HPI and PMH.   Allergies  Acetaminophen-codeine and Penicillins  Home Medications   Prior to Admission medications   Medication Sig Start Date End Date  Taking? Authorizing Provider  aspirin EC 81 MG tablet Take 81 mg by mouth every evening.    Yes Historical Provider, MD  beta carotene w/minerals (OCUVITE) tablet Take 1 tablet by mouth every evening.    Yes Historical Provider, MD  dutasteride (AVODART) 0.5 MG capsule Take 0.5 mg by mouth daily.    Yes Historical Provider, MD  metFORMIN (GLUCOPHAGE) 500 MG tablet Take 1 tablet by mouth daily. 04/28/14  Yes Historical Provider, MD  omeprazole (PRILOSEC) 40 MG capsule Take 40 mg by mouth daily.   Yes Historical Provider, MD  pindolol (VISKEN) 5 MG tablet Take 1 tablet by mouth daily. 04/22/14  Yes Historical Provider, MD  sitaGLIPtin (JANUVIA) 100 MG tablet Take 100 mg by mouth every evening.    Yes Historical Provider, MD  valsartan (DIOVAN) 160 MG tablet Take 160 mg by mouth daily.     Yes Historical Provider, MD  valACYclovir (VALTREX) 1000 MG tablet Take 1 tablet (1,000 mg total) by mouth 3 (three) times daily. 05/11/14 05/25/14  Kyle HutchingBrian China Deitrick, MD   There were no vitals taken for this visit. Physical Exam  Nursing note and vitals reviewed. Constitutional: He is oriented to person, place, and time. He appears well-developed and well-nourished.  HENT:  Head: Normocephalic and atraumatic.  Eyes: Conjunctivae and EOM are normal. Pupils are equal, round, and reactive  to light.  Neck: Normal range of motion. Neck supple.  Cardiovascular: Normal rate, regular rhythm and normal heart sounds.   Pulmonary/Chest: Effort normal and breath sounds normal.  Abdominal: Soft. Bowel sounds are normal.  Musculoskeletal: Normal range of motion.  Neurological: He is alert and oriented to person, place, and time.  Skin: Skin is warm and dry. Rash noted.  Erythematous vesicular papular rash on right T-11 dermatome starting on his RLQ radiating to right flank.  Psychiatric: He has a normal mood and affect. His behavior is normal.    ED Course  Procedures (including critical care time) DIAGNOSTIC  STUDIES:   COORDINATION OF CARE: 1:49 PM- Will prescribe Prednisone and antiviral medication. Pt verbalizes understanding and agrees to plan.  Medications  valACYclovir (VALTREX) tablet 1,000 mg (1,000 mg Oral Given 05/11/14 1411)  predniSONE (DELTASONE) tablet 40 mg (40 mg Oral Given 05/11/14 1411)    Labs Review Labs Reviewed - No data to display  Imaging Review No results found.   EKG Interpretation None      MDM   Final diagnoses:  Shingles    History and physical consistent with shingles. Rx valacyclovir 1000 mg 3 times a day for one week and prednisone taper.  I personally performed the services described in this documentation, which was scribed in my presence. The recorded information has been reviewed and is accurate.    Kyle HutchingBrian Malakhai Beitler, MD 05/12/14 1102

## 2014-07-01 ENCOUNTER — Ambulatory Visit: Payer: Medicare Other | Admitting: Urology

## 2014-07-16 ENCOUNTER — Encounter (INDEPENDENT_AMBULATORY_CARE_PROVIDER_SITE_OTHER): Payer: Self-pay | Admitting: *Deleted

## 2014-09-28 ENCOUNTER — Encounter (HOSPITAL_COMMUNITY): Payer: Self-pay | Admitting: Emergency Medicine

## 2014-09-28 ENCOUNTER — Emergency Department (HOSPITAL_COMMUNITY): Payer: Medicare Other

## 2014-09-28 ENCOUNTER — Emergency Department (HOSPITAL_COMMUNITY)
Admission: EM | Admit: 2014-09-28 | Discharge: 2014-09-28 | Disposition: A | Discharge status: Home / Self Care | Payer: Medicare Other | Attending: Emergency Medicine | Admitting: Emergency Medicine

## 2014-09-28 DIAGNOSIS — E119 Type 2 diabetes mellitus without complications: Secondary | ICD-10-CM | POA: Insufficient documentation

## 2014-09-28 DIAGNOSIS — I2510 Atherosclerotic heart disease of native coronary artery without angina pectoris: Secondary | ICD-10-CM | POA: Diagnosis not present

## 2014-09-28 DIAGNOSIS — Z880 Allergy status to penicillin: Secondary | ICD-10-CM | POA: Insufficient documentation

## 2014-09-28 DIAGNOSIS — Z7982 Long term (current) use of aspirin: Secondary | ICD-10-CM | POA: Insufficient documentation

## 2014-09-28 DIAGNOSIS — W01198A Fall on same level from slipping, tripping and stumbling with subsequent striking against other object, initial encounter: Secondary | ICD-10-CM | POA: Diagnosis not present

## 2014-09-28 DIAGNOSIS — Y92128 Other place in nursing home as the place of occurrence of the external cause: Secondary | ICD-10-CM | POA: Insufficient documentation

## 2014-09-28 DIAGNOSIS — I4891 Unspecified atrial fibrillation: Secondary | ICD-10-CM | POA: Insufficient documentation

## 2014-09-28 DIAGNOSIS — W19XXXA Unspecified fall, initial encounter: Secondary | ICD-10-CM

## 2014-09-28 DIAGNOSIS — Z951 Presence of aortocoronary bypass graft: Secondary | ICD-10-CM | POA: Insufficient documentation

## 2014-09-28 DIAGNOSIS — I10 Essential (primary) hypertension: Secondary | ICD-10-CM | POA: Insufficient documentation

## 2014-09-28 DIAGNOSIS — Z79899 Other long term (current) drug therapy: Secondary | ICD-10-CM | POA: Diagnosis not present

## 2014-09-28 DIAGNOSIS — S79922A Unspecified injury of left thigh, initial encounter: Secondary | ICD-10-CM | POA: Diagnosis present

## 2014-09-28 DIAGNOSIS — R52 Pain, unspecified: Secondary | ICD-10-CM

## 2014-09-28 DIAGNOSIS — G629 Polyneuropathy, unspecified: Secondary | ICD-10-CM | POA: Diagnosis not present

## 2014-09-28 DIAGNOSIS — E782 Mixed hyperlipidemia: Secondary | ICD-10-CM | POA: Diagnosis not present

## 2014-09-28 DIAGNOSIS — S80812A Abrasion, left lower leg, initial encounter: Secondary | ICD-10-CM | POA: Diagnosis not present

## 2014-09-28 DIAGNOSIS — Y9389 Activity, other specified: Secondary | ICD-10-CM | POA: Diagnosis not present

## 2014-09-28 DIAGNOSIS — S7012XA Contusion of left thigh, initial encounter: Secondary | ICD-10-CM | POA: Diagnosis not present

## 2014-09-28 DIAGNOSIS — M79605 Pain in left leg: Secondary | ICD-10-CM

## 2014-09-28 MED ORDER — ACETAMINOPHEN 325 MG PO TABS
ORAL_TABLET | ORAL | Status: AC
Start: 2014-09-28 — End: 2014-09-28
  Filled 2014-09-28: qty 1

## 2014-09-28 MED ORDER — ACETAMINOPHEN 325 MG PO TABS
650.0000 mg | ORAL_TABLET | Freq: Once | ORAL | Status: AC
  Administered 2014-09-28: 650 mg via ORAL
  Filled 2014-09-28: qty 2

## 2014-09-28 NOTE — ED Notes (Signed)
Pt amb[facility] with 4-wheel walker [facility] Pt had shortness of breath during walk requiring him to stop twice to catch his breath.

## 2014-09-28 NOTE — ED Notes (Signed)
Patient with no complaints at this time. Respirations even and unlabored. Skin warm/dry. Discharge instructions reviewed with patient at this time. Patient given opportunity to voice concerns/ask questions. Patient discharged at this time and left Emergency Department with steady gait.   

## 2014-09-28 NOTE — ED Notes (Signed)
C/o left upper leg pai times 2 days.  Rates a 10/10.  Denies any injury.  Good distal pulses.

## 2014-09-28 NOTE — ED Provider Notes (Signed)
CSN: [id number]     Arrival date & time 09/28/14  1200 History  This chart was scribed for Hilario Quarryanielle S Treylen Gibbs, MD by Tonye RoyaltyJoshua Chen, ED Scribe. This patient was seen in room APA04/APA04 and the patient's care was started at 2:50 PM.    Chief Complaint  Patient presents with  . Leg Pain   Patient is a 80 y.o. male presenting with leg pain. The history is provided by the patient, a friend and a caregiver. No language interpreter was used.  Leg Pain Location:  Leg Time since incident:  2 weeks Leg location:  L leg Pain details:    Severity:  Moderate   Onset quality:  Gradual   Duration:  2 weeks   Timing:  Intermittent Chronicity:  New Dislocation: no   Foreign body present:  No foreign bodies Prior injury to area:  No Relieved by:  Nothing Worsened by:  Nothing tried Ineffective treatments:  None tried   HPI Comments: [name] is a 80 y.o. male with history of peripheral neuropathy in both legs who presents to the Emergency Department complaining of left leg pain with onset 1.5 weeks ago. He states he has had peripheral neuropathy for several years, increased lately to his left leg. Per caretaker, he has stated that the pain is intermittent for 2-3 minutes at a time. He states he fell 3-4 months ago, but denies recent injury to his left leg. However, he states he sometimes bumps into things and does not feel it. He states he is not aware of taking any blood thinners. Per neighbor, he fell approximately 2 weeks ago while sitting on the toilet and struck his head but was not evaluated. She states she noticed agitation and confusion on the day of the fall. She states he is not on any blood thinners. She states he has been walking, but with greater difficulty due to balance problem in the past 1.5 week. [facility]  Past Medical History  Diagnosis Date  . Coronary atherosclerosis of native coronary artery     Multivessel status post CABG  . Mixed  hyperlipidemia   . Type 2 diabetes mellitus   . Atrial fibrillation   . Essential hypertension, benign    Past Surgical History  Procedure Laterality Date  . Tonsillectomy    . Cholecystectomy    . Knee arthroscopy    . Hernia repair    . Coronary artery bypass graft  1992    LIMA to LAD and diagonal, SVG to RCA, SVG to OM  . Back surgery    . Esophagogastroduodenoscopy (egd) with esophageal dilation N/A 09/12/2013    Procedure: ESOPHAGOGASTRODUODENOSCOPY (EGD) WITH ESOPHAGEAL DILATION;  Surgeon: Malissa HippoNajeeb U Rehman, MD;  [facility];  Service: Endoscopy;  Laterality: N/A;   Family History  Problem Relation Age of Onset  . Diabetes     History  Substance Use Topics  . Smoking status: Never Smoker   . Smokeless tobacco: Not on file  . Alcohol Use: No    Review of Systems  Musculoskeletal: Positive for myalgias.  All other systems reviewed and are negative.     Allergies  Acetaminophen-codeine and Penicillins  Home Medications   Prior to Admission medications   Medication Sig Start Date End Date Taking? Authorizing Provider  aspirin EC 81 MG tablet Take 81 mg by mouth every evening.     Historical Provider, MD  beta carotene w/minerals (OCUVITE) tablet Take 1 tablet by  mouth every evening.     Historical Provider, MD  dutasteride (AVODART) 0.5 MG capsule Take 0.5 mg by mouth daily.     Historical Provider, MD  metFORMIN (GLUCOPHAGE) 500 MG tablet Take 1 tablet by mouth daily. 04/28/14   Historical Provider, MD  omeprazole (PRILOSEC) 40 MG capsule Take 40 mg by mouth daily.    Historical Provider, MD  pindolol (VISKEN) 5 MG tablet Take 1 tablet by mouth daily. 04/22/14   Historical Provider, MD  sitaGLIPtin (JANUVIA) 100 MG tablet Take 100 mg by mouth every evening.     Historical Provider, MD  valsartan (DIOVAN) 160 MG tablet Take 160 mg by mouth daily.      Historical Provider, MD   BP 110/56 mmHg  Pulse 60  Temp(Src) 98.1 F (36.7 C) (Oral)  Resp 18  Ht 5'  7" (1.702 m)  Wt 190 lb (86.183 kg)  BMI 29.75 kg/m2  SpO2 97% Physical Exam  Constitutional: He is oriented to person, place, and time. He appears well-developed and well-nourished. No distress.  HENT:  Head: Normocephalic and atraumatic.  Right Ear: External ear normal.  Left Ear: External ear normal.  Nose: Nose normal.  Eyes: EOM are normal.  Neck: Normal range of motion. Neck supple.  Pulmonary/Chest: Effort normal.  Musculoskeletal: Normal range of motion.  Large contusion to left medial thigh/groin area. Tenderness to palpation in anterior aspect of left inguinal area. Full active ROM of hip, but pain with internal and external rotation of left leg but not with flexion of knee. Left knee and thigh appear nontender, including area of the ecchymosis.  Neurological: He is alert and oriented to person, place, and time. He exhibits normal muscle tone. Coordination normal.  Skin: Skin is warm and dry.  Abrasion to left lower leg.  Psychiatric: He has a normal mood and affect. His behavior is normal. Thought content normal.  Nursing note and vitals reviewed.   ED Course  Procedures (including critical care time)  DIAGNOSTIC STUDIES: Oxygen Saturation is 97% on room air, normal by my interpretation.    COORDINATION OF CARE: 3:03 PM Discussed treatment plan with patient at beside, the patient agrees with the plan and has no further questions at this time.  Imaging Review Dg Hip Complete Left  09/28/2014   CLINICAL DATA:  Multiple recent falls. Left hip injury and pain. Initial encounter.  EXAM: LEFT HIP - COMPLETE 2+ VIEW  COMPARISON:  11/01/2011  FINDINGS: There is no evidence of hip fracture or dislocation. Mild degenerative spurring is seen without significant joint space narrowing. Mild peripheral vascular calcification noted.  IMPRESSION: No acute findings.  Mild degenerative spurring.   Electronically Signed   By: Myles RosenthalJohn  Stahl M.D.   On: 09/28/2014 16:14   Ct Head Wo  Contrast  09/28/2014   CLINICAL DATA:  Leg pain.  Neuropathy.  EXAM: CT HEAD WITHOUT CONTRAST  TECHNIQUE: Contiguous axial images were obtained from the base of the skull through the vertex without intravenous contrast.  COMPARISON:  12/05/2013.  FINDINGS: The diffuse cerebral atrophy present. Left occipital encephalomalacia noted most likely from prior stroke no mass. No hydrocephalus. No hemorrhage. Exam stable from prior exam. No acute bony abnormality identified.  IMPRESSION: 1.  No acute abnormality.  2. Diffuse cerebral atrophy and left occipital encephalomalacia. No change.   Electronically Signed   By: Maisie Fushomas  Register   On: 09/28/2014 15:38   Ct Pelvis Wo Contrast  09/28/2014   CLINICAL DATA:  Severe left hip pain after  fall. Negative hip radiographs. Evaluate for cul fracture. Initial encounter.  EXAM: CT PELVIS WITHOUT CONTRAST  TECHNIQUE: Multidetector CT imaging of the pelvis was performed following the standard protocol without intravenous contrast.  COMPARISON:  None.  FINDINGS: No evidence of left hip fracture or dislocation. No evidence of acetabular or pelvic fracture. Mild bilateral hip osteoarthritis noted. Mild hematoma seen in soft tissues anterior to the left hip joint.  IMPRESSION: No evidence of left hip or pelvic fracture. Mild soft tissue hematoma seen in soft tissues anterior to left hip joint.   Electronically Signed   By: Myles Rosenthal M.D.   On: 09/28/2014 17:30   Dg Knee Complete 4 Views Left  09/28/2014   CLINICAL DATA:  Right leg pain.  Recent falls  EXAM: LEFT KNEE - COMPLETE 4+ VIEW  COMPARISON:  None.  FINDINGS: Normal alignment no fracture or effusion.  Moderate to advanced joint space narrowing medially. Lateral joint space is normal. Patellofemoral joint space is normal. No focal bony lesion.  IMPRESSION: Medial compartment osteoarthritis.  No acute abnormality.   Electronically Signed   By: Marlan Palau M.D.   On: 09/28/2014 16:14     MDM   Final diagnoses:   Pain  Fall  Acute pain of lower extremity, left  Hematoma of left thigh, initial encounter  80 y.o. Male with left hip pain with hematoma medial thigh (proximal) no evidence of fracture on plain film or ct.  Patient ambulatory but painful- may be from hematoma.  Discussed with patient and friend at bedside and return precautions and follow up.  I personally performed the services described in this documentation, which was scribed in my presence. The recorded information has been reviewed and considered.   Hilario Quarry, MD 09/29/14 1226

## 2014-09-28 NOTE — Discharge Instructions (Signed)
Please recheck with Dr. Margo AyeHall if continued pain.  Return if worse at any time.

## 2014-09-28 NOTE — ED Notes (Signed)
Patient states left quadricept pain. States it feels like he strained it somehow. Also c/o worsening neuropathy pain to feet. Noted to ambulated in hallway with walker. Leans heavily on walker.

## 2014-10-14 ENCOUNTER — Ambulatory Visit (INDEPENDENT_AMBULATORY_CARE_PROVIDER_SITE_OTHER): Payer: Medicare Other | Admitting: Internal Medicine

## 2014-10-15 ENCOUNTER — Encounter (HOSPITAL_COMMUNITY): Payer: Self-pay

## 2014-10-15 ENCOUNTER — Emergency Department (HOSPITAL_COMMUNITY)
Admission: EM | Admit: 2014-10-15 | Discharge: 2014-10-16 | Disposition: A | Discharge status: Home / Self Care | Payer: Medicare Other | Attending: Emergency Medicine | Admitting: Emergency Medicine

## 2014-10-15 ENCOUNTER — Emergency Department (HOSPITAL_COMMUNITY): Payer: Medicare Other

## 2014-10-15 DIAGNOSIS — R2242 Localized swelling, mass and lump, left lower limb: Secondary | ICD-10-CM | POA: Diagnosis present

## 2014-10-15 DIAGNOSIS — M79605 Pain in left leg: Secondary | ICD-10-CM | POA: Diagnosis not present

## 2014-10-15 DIAGNOSIS — Z7982 Long term (current) use of aspirin: Secondary | ICD-10-CM | POA: Insufficient documentation

## 2014-10-15 DIAGNOSIS — E782 Mixed hyperlipidemia: Secondary | ICD-10-CM | POA: Insufficient documentation

## 2014-10-15 DIAGNOSIS — Z880 Allergy status to penicillin: Secondary | ICD-10-CM | POA: Insufficient documentation

## 2014-10-15 DIAGNOSIS — Z951 Presence of aortocoronary bypass graft: Secondary | ICD-10-CM | POA: Insufficient documentation

## 2014-10-15 DIAGNOSIS — Z79899 Other long term (current) drug therapy: Secondary | ICD-10-CM | POA: Diagnosis not present

## 2014-10-15 DIAGNOSIS — I2510 Atherosclerotic heart disease of native coronary artery without angina pectoris: Secondary | ICD-10-CM | POA: Insufficient documentation

## 2014-10-15 DIAGNOSIS — E119 Type 2 diabetes mellitus without complications: Secondary | ICD-10-CM | POA: Insufficient documentation

## 2014-10-15 DIAGNOSIS — I10 Essential (primary) hypertension: Secondary | ICD-10-CM | POA: Insufficient documentation

## 2014-10-15 DIAGNOSIS — R52 Pain, unspecified: Secondary | ICD-10-CM

## 2014-10-15 LAB — BASIC METABOLIC PANEL
ANION GAP: 12 (ref 5–15)
BUN: 23 mg/dL (ref 6–23)
CO2: 23 meq/L (ref 19–32)
CREATININE: 1.15 mg/dL (ref 0.50–1.35)
Calcium: 9 mg/dL (ref 8.4–10.5)
Chloride: 93 mEq/L — ABNORMAL LOW (ref 96–112)
GFR calc Af Amer: 61 mL/min — ABNORMAL LOW (ref >90)
GFR calc non Af Amer: 53 mL/min — ABNORMAL LOW (ref >90)
Glucose, Bld: 104 mg/dL — ABNORMAL HIGH (ref 70–99)
POTASSIUM: 4.7 meq/L (ref 3.7–5.3)
Sodium: 128 mEq/L — ABNORMAL LOW (ref 137–147)

## 2014-10-15 LAB — CBC WITH DIFFERENTIAL/PLATELET
BASOS ABS: 0.1 10*3/uL (ref 0.0–0.1)
Basophils Relative: 1 % (ref 0–1)
Eosinophils Absolute: 0.2 10*3/uL (ref 0.0–0.7)
Eosinophils Relative: 3 % (ref 0–5)
HEMATOCRIT: 33 % — AB (ref 39.0–52.0)
HEMOGLOBIN: 11.4 g/dL — AB (ref 13.0–17.0)
Lymphocytes Relative: 24 % (ref 12–46)
Lymphs Abs: 1.8 10*3/uL (ref 0.7–4.0)
MCH: 31 pg (ref 26.0–34.0)
MCHC: 34.5 g/dL (ref 30.0–36.0)
MCV: 89.7 fL (ref 78.0–100.0)
MONO ABS: 1.3 10*3/uL — AB (ref 0.1–1.0)
Monocytes Relative: 17 % — ABNORMAL HIGH (ref 3–12)
Neutro Abs: 4.3 10*3/uL (ref 1.7–7.7)
Neutrophils Relative %: 55 % (ref 43–77)
Platelets: 181 10*3/uL (ref 150–400)
RBC: 3.68 MIL/uL — AB (ref 4.22–5.81)
RDW: 13.9 % (ref 11.5–15.5)
WBC: 7.7 10*3/uL (ref 4.0–10.5)

## 2014-10-15 LAB — PROTIME-INR
INR: 1.19 (ref 0.00–1.49)
Prothrombin Time: 15.2 seconds (ref 11.6–15.2)

## 2014-10-15 MED ORDER — DEXTROSE 5 % IV SOLN
1.0000 g | Freq: Once | INTRAVENOUS | Status: AC
  Administered 2014-10-15: 1 g via INTRAVENOUS
  Filled 2014-10-15: qty 10

## 2014-10-15 NOTE — ED Notes (Signed)
Left lower leg swollen and red. Pt states its been like this for over a week

## 2014-10-15 NOTE — ED Provider Notes (Signed)
CSN: [id number]     Arrival date & time 10/15/14  2143 History  This chart was scribe for Gilda Creasehristopher J. Pollina, * by Angelene GiovanniEmmanuella Mensah, ED Scribe. The patient was seen in room APA12/APA12 and the patient's care was started at 10:17 PM.    Chief Complaint  Patient presents with  . Leg Swelling   The history is provided by the patient. No language interpreter was used.   HPI Comments: [name] is a 80 y.o. male who presents to the Emergency Department complaining of a left lower leg swelling onset 1 week ago. There is associated redness and pain.  He had a previous injury after a fall which led to bruising down his left leg but there was no swelling at that time. He reports taking aspirin. [facility] [place]   Past Medical History  Diagnosis Date  . Coronary atherosclerosis of native coronary artery     Multivessel status post CABG  . Mixed hyperlipidemia   . Type 2 diabetes mellitus   . Atrial fibrillation   . Essential hypertension, benign    Past Surgical History  Procedure Laterality Date  . Tonsillectomy    . Cholecystectomy    . Knee arthroscopy    . Hernia repair    . Coronary artery bypass graft  1992    LIMA to LAD and diagonal, SVG to RCA, SVG to OM  . Back surgery    . Esophagogastroduodenoscopy (egd) with esophageal dilation N/A 09/12/2013    Procedure: ESOPHAGOGASTRODUODENOSCOPY (EGD) WITH ESOPHAGEAL DILATION;  Surgeon: Malissa HippoNajeeb U Rehman, MD;  Location: [facility];  Service: Endoscopy;  Laterality: N/A;   Family History  Problem Relation Age of Onset  . Diabetes     History  Substance Use Topics  . Smoking status: Never Smoker   . Smokeless tobacco: Not on file  . Alcohol Use: No    Review of Systems  Constitutional: Negative for fever.  Cardiovascular: Positive for leg swelling (Left leg).  All other systems reviewed and are negative.     Allergies  Acetaminophen-codeine; Advil; and Penicillins  Home  Medications   Prior to Admission medications   Medication Sig Start Date End Date Taking? Authorizing Provider  aspirin EC 81 MG tablet Take 81 mg by mouth every evening.    Yes Historical Provider, MD  beta carotene w/minerals (OCUVITE) tablet Take 1 tablet by mouth every evening.    Yes Historical Provider, MD  dutasteride (AVODART) 0.5 MG capsule Take 0.5 mg by mouth every evening.    Yes Historical Provider, MD  gabapentin (NEURONTIN) 100 MG capsule Take 100 mg by mouth at bedtime. 09/30/14  Yes Historical Provider, MD  metFORMIN (GLUCOPHAGE) 500 MG tablet Take 1 tablet by mouth daily. 04/28/14  Yes Historical Provider, MD  mirabegron ER (MYRBETRIQ) 25 MG TB24 tablet Take 25 mg by mouth every evening. *Adminstered in the evening*   Yes Historical Provider, MD  Multiple Vitamin (MULTIVITAMIN WITH MINERALS) TABS tablet Take 1 tablet by mouth every morning.   Yes Historical Provider, MD  omeprazole (PRILOSEC) 40 MG capsule Take 40 mg by mouth daily.   Yes Historical Provider, MD  pindolol (VISKEN) 5 MG tablet Take 2.5 mg by mouth daily.  04/22/14  Yes Historical Provider, MD  sitaGLIPtin (JANUVIA) 100 MG tablet Take 100 mg by mouth every evening.    Yes Historical Provider, MD  valsartan (DIOVAN) 160 MG tablet Take 160 mg by mouth every evening.    Yes  Historical Provider, MD  ibuprofen (ADVIL,MOTRIN) 200 MG tablet Take 400 mg by mouth every 6 (six) hours as needed for moderate pain.    Historical Provider, MD  traMADol (ULTRAM) 50 MG tablet Take 25 mg by mouth every 6 (six) hours as needed for severe pain.    Historical Provider, MD   BP 108/51 mmHg  Pulse 73  Temp(Src) 98.6 F (37 C) (Oral)  Resp 16  Ht 5' 7.5" (1.715 m)  Wt 190 lb (86.183 kg)  BMI 29.30 kg/m2  SpO2 97% Physical Exam  Constitutional: He is oriented to person, place, and time. He appears well-developed and well-nourished. No distress.  HENT:  Head: Normocephalic and atraumatic.  Right Ear: Hearing normal.  Left Ear:  Hearing normal.  Nose: Nose normal.  Mouth/Throat: Oropharynx is clear and moist and mucous membranes are normal.  Eyes: Conjunctivae and EOM are normal. Pupils are equal, round, and reactive to light.  Neck: Normal range of motion. Neck supple.  Cardiovascular: Regular rhythm, S1 normal and S2 normal.  Exam reveals no gallop and no friction rub.   No murmur heard. Pulses:      Dorsalis pedis pulses are 1+ on the right side, and 1+ on the left side.  Pulmonary/Chest: Effort normal and breath sounds normal. No respiratory distress. He exhibits no tenderness.  Abdominal: Soft. Normal appearance and bowel sounds are normal. There is no hepatosplenomegaly. There is no tenderness. There is no rebound, no guarding, no tenderness at McBurney's point and negative Murphy's sign. No hernia.  Musculoskeletal: Normal range of motion.       Left lower leg: He exhibits tenderness, swelling and edema.  Neurological: He is alert and oriented to person, place, and time. He has normal strength. No cranial nerve deficit or sensory deficit. Coordination normal. GCS eye subscore is 4. GCS verbal subscore is 5. GCS motor subscore is 6.  Skin: Skin is warm, dry and intact. No rash noted. No cyanosis.     Psychiatric: He has a normal mood and affect. His speech is normal and behavior is normal. Thought content normal.  Nursing note and vitals reviewed.   ED Course  Procedures (including critical care time) DIAGNOSTIC STUDIES: Oxygen Saturation is 97% on RA, normal by my interpretation.    COORDINATION OF CARE: 10:22 PM- Pt advised of plan for treatment and pt agrees.   Labs Review Labs Reviewed  CBC WITH DIFFERENTIAL - Abnormal; Notable for the following:    RBC 3.68 (*)    Hemoglobin 11.4 (*)    HCT 33.0 (*)    Monocytes Relative 17 (*)    Monocytes Absolute 1.3 (*)    All other components within normal limits  BASIC METABOLIC PANEL  PROTIME-INR  D-DIMER, QUANTITATIVE    Imaging Review Dg  Tibia/fibula Left  10/15/2014   CLINICAL DATA:  Pain and swelling in the left lower leg for 1 week after a fall 1.5 weeks ago. Redness of the lower leg with multiple sores.  EXAM: LEFT TIBIA AND FIBULA - 2 VIEW  COMPARISON:  Left knee 09/28/2014  FINDINGS: Degenerative changes in the left knee and left ankle. There is no evidence of fracture or other focal bone lesions. Soft tissues are unremarkable. Vascular calcifications.  IMPRESSION: No acute bony abnormalities.   Electronically Signed   By: Burman NievesWilliam  Stevens M.D.   On: 10/15/2014 23:26     EKG Interpretation None      MDM   Final diagnoses:  Pain   Patient presents to  the ER for evaluation of pain and swelling of the left leg. Patient reports that it has been this way for approximately a week. Patient was seen in the ER several weeks ago for pain in the upper left leg, had a bruise in the region. The upper leg pain and bruising has resolved, but now he is complaining of pain and swelling of the lower leg. Examination reveals multiple scabs on the legs without any purulence. There is some mild erythema which is faint lacelike, might represent early cellulitis. Patient was empirically given Rocephin for coverage for cellulitis.  She has a moderately elevated d-dimer. Clinically, I do suspect DVT. Patient will be scheduled for a venous duplex in the morning. If negative, continue treatment for infection. If positive will require anticoagulation. Patient given Lovenox here in the ER prior to discharge.  I personally performed the services described in this documentation, which was scribed in my presence. The recorded information has been reviewed and is accurate.    Gilda Crease, MD 10/16/14 0010

## 2014-10-16 ENCOUNTER — Other Ambulatory Visit (HOSPITAL_COMMUNITY): Payer: Self-pay | Admitting: Emergency Medicine

## 2014-10-16 ENCOUNTER — Ambulatory Visit (HOSPITAL_COMMUNITY)
Admission: RE | Admit: 2014-10-16 | Discharge: 2014-10-16 | Disposition: A | Discharge status: Home / Self Care | Payer: Medicare Other | Source: Ambulatory Visit | Attending: Emergency Medicine | Admitting: Emergency Medicine

## 2014-10-16 ENCOUNTER — Ambulatory Visit (HOSPITAL_COMMUNITY)
Admit: 2014-10-16 | Discharge: 2014-10-16 | Disposition: A | Discharge status: Home / Self Care | Payer: Medicare Other | Source: Ambulatory Visit | Attending: Emergency Medicine | Admitting: Emergency Medicine

## 2014-10-16 DIAGNOSIS — R52 Pain, unspecified: Secondary | ICD-10-CM

## 2014-10-16 DIAGNOSIS — M79604 Pain in right leg: Secondary | ICD-10-CM

## 2014-10-16 DIAGNOSIS — M7989 Other specified soft tissue disorders: Secondary | ICD-10-CM | POA: Insufficient documentation

## 2014-10-16 DIAGNOSIS — R609 Edema, unspecified: Secondary | ICD-10-CM

## 2014-10-16 LAB — D-DIMER, QUANTITATIVE (NOT AT ARMC): D DIMER QUANT: 2.12 ug{FEU}/mL — AB (ref 0.00–0.48)

## 2014-10-16 MED ORDER — ENOXAPARIN SODIUM 80 MG/0.8ML ~~LOC~~ SOLN
80.0000 mg | Freq: Once | SUBCUTANEOUS | Status: AC
  Administered 2014-10-16: 80 mg via SUBCUTANEOUS
  Filled 2014-10-16: qty 0.8

## 2014-10-16 NOTE — Discharge Instructions (Signed)
Cellulitis Cellulitis is an infection of the skin and the tissue beneath it. The infected area is usually red and tender. Cellulitis occurs most often in the arms and lower legs.  CAUSES  Cellulitis is caused by bacteria that enter the skin through cracks or cuts in the skin. The most common types of bacteria that cause cellulitis are staphylococci and streptococci. SIGNS AND SYMPTOMS   Redness and warmth.  Swelling.  Tenderness or pain.  Fever. DIAGNOSIS  Your health care provider can usually determine what is wrong based on a physical exam. Blood tests may also be done. TREATMENT  Treatment usually involves taking an antibiotic medicine. HOME CARE INSTRUCTIONS   Take your antibiotic medicine as directed by your health care provider. Finish the antibiotic even if you start to feel better.  Keep the infected arm or leg elevated to reduce swelling.  Apply a warm cloth to the affected area up to 4 times per day to relieve pain.  Take medicines only as directed by your health care provider.  Keep all follow-up visits as directed by your health care provider. SEEK MEDICAL CARE IF:   You notice red streaks coming from the infected area.  Your red area gets larger or turns dark in color.  Your bone or joint underneath the infected area becomes painful after the skin has healed.  Your infection returns in the same area or another area.  You notice a swollen bump in the infected area.  You develop new symptoms.  You have a fever. SEEK IMMEDIATE MEDICAL CARE IF:   You feel very sleepy.  You develop vomiting or diarrhea.  You have a general ill feeling (malaise) with muscle aches and pains. MAKE SURE YOU:   Understand these instructions.  Will watch your condition.  Will get help right away if you are not doing well or get worse. Document Released: 08/24/2005 Document Revised: 03/31/2014 Document Reviewed: 01/30/2012 [facility] Patient Information 2015 [place].  This information is not intended to replace advice given to you by your health care provider. Make sure you discuss any questions you have with your health care provider. Deep Vein Thrombosis A deep vein thrombosis (DVT) is a blood clot that develops in the deep, larger veins of the leg, arm, or pelvis. These are more dangerous than clots that might form in veins near the surface of the body. A DVT can lead to serious and even life-threatening complications if the clot breaks off and travels in the bloodstream to the lungs.  A DVT can damage the valves in your leg veins so that instead of flowing upward, the blood pools in the lower leg. This is called post-thrombotic syndrome, and it can result in pain, swelling, discoloration, and sores on the leg. CAUSES Usually, several things contribute to the formation of blood clots. Contributing factors include:  The flow of blood slows down.  The inside of the vein is damaged in some way.  You have a condition that makes blood clot more easily. RISK FACTORS Some people are more likely than others to develop blood clots. Risk factors include:   Smoking.  Being overweight (obese).  Sitting or lying still for a long time. This includes long-distance travel, paralysis, or recovery from an illness or surgery. Other factors that increase risk are:   Older age, especially over 78 years of age.  Having a family history of blood clots or if you have already had a blot clot.  Having major or lengthy surgery. This is  especially true for surgery on the hip, knee, or belly (abdomen). Hip surgery is particularly high risk.  Having a long, thin tube (catheter) placed inside a vein during a medical procedure.  Breaking a hip or leg.  Having cancer or cancer treatment.  Pregnancy and childbirth.  Hormone changes make the blood clot more easily during pregnancy.  The fetus puts pressure on the veins of the pelvis.  There is a risk of injury to veins  during delivery or a caesarean delivery. The risk is highest just after childbirth.  Medicines containing the male hormone estrogen. This includes birth control pills and hormone replacement therapy.  Other circulation or heart problems.  SIGNS AND SYMPTOMS When a clot forms, it can either partially or totally block the blood flow in that vein. Symptoms of a DVT can include:  Swelling of the leg or arm, especially if one side is much worse.  Warmth and redness of the leg or arm, especially if one side is much worse.  Pain in an arm or leg. If the clot is in the leg, symptoms may be more noticeable or worse when standing or walking. The symptoms of a DVT that has traveled to the lungs (pulmonary embolism, PE) usually start suddenly and include:  Shortness of breath.  Coughing.  Coughing up blood or blood-tinged mucus.  Chest pain. The chest pain is often worse with deep breaths.  Rapid heartbeat. Anyone with these symptoms should get emergency medical treatment right away. Do not wait to see if the symptoms will go away. Call your local emergency services (911 in the [place] if you have these symptoms. Do not drive yourself to the hospital. DIAGNOSIS If a DVT is suspected, your health care provider will take a full medical history and perform a physical exam. Tests that also may be required include:  Blood tests, including studies of the clotting properties of the blood.  Ultrasound to see if you have clots in your legs or lungs.  X-rays to show the flow of blood when dye is injected into the veins (venogram).  Studies of your lungs if you have any chest symptoms. PREVENTION  Exercise the legs regularly. Take a brisk 30-minute walk every day.  Maintain a weight that is appropriate for your height.  Avoid sitting or lying in bed for long periods of time without moving your legs.  Women, particularly those over the age of 35 years, should consider the risks and benefits of  taking estrogen medicines, including birth control pills.  Do not smoke, especially if you take estrogen medicines.  Long-distance travel can increase your risk of DVT. You should exercise your legs by walking or pumping the muscles every hour.  Many of the risk factors above relate to situations that exist with hospitalization, either for illness, injury, or elective surgery. Prevention may include medical and nonmedical measures.  Your health care provider will assess you for the need for venous thromboembolism prevention when you are admitted to the hospital. If you are having surgery, your surgeon will assess you the day of or day after surgery. TREATMENT Once identified, a DVT can be treated. It can also be prevented in some circumstances. Once you have had a DVT, you may be at increased risk for a DVT in the future. The most common treatment for DVT is blood-thinning (anticoagulant) medicine, which reduces the blood's tendency to clot. Anticoagulants can stop new blood clots from forming and stop old clots from growing. They cannot dissolve existing clots.  Your body does this by itself over time. Anticoagulants can be given by mouth, through an IV tube, or by injection. Your health care provider will determine the best program for you. Other medicines or treatments that may be used are:  Heparin or related medicines (low molecular weight heparin) are often the first treatment for a blood clot. They act quickly. However, they cannot be taken orally and must be given either in shot form or by IV tube.  Heparin can cause a fall in a component of blood that stops bleeding and forms blood clots (platelets). You will be monitored with blood tests to be sure this does not occur.  Warfarin is an anticoagulant that can be swallowed. It takes a few days to start working, so usually heparin or related medicines are used in combination. Once warfarin is working, heparin is usually stopped.  Factor Xa  inhibitor medicines, such as rivaroxaban and apixaban, also reduce blood clotting. These medicines are taken orally and can often be used without heparin or related medicines.  Less commonly, clot dissolving drugs (thrombolytics) are used to dissolve a DVT. They carry a high risk of bleeding, so they are used mainly in severe cases where your life or a part of your body is threatened.  Very rarely, a blood clot in the leg needs to be removed surgically.  If you are unable to take anticoagulants, your health care provider may arrange for you to have a filter placed in a main vein in your abdomen. This filter prevents clots from traveling to your lungs. HOME CARE INSTRUCTIONS  Take all medicines as directed by your health care provider.  Learn as much as you can about DVT.  Wear a medical alert bracelet or carry a medical alert card.  Ask your health care provider how soon you can go back to normal activities. It is important to stay active to prevent blood clots. If you are on anticoagulant medicine, avoid contact sports.  It is very important to exercise. This is especially important while traveling, sitting, or standing for long periods of time. Exercise your legs by walking or by tightening and relaxing your leg muscles regularly. Take frequent walks.  You may need to wear compression stockings. These are tight elastic stockings that apply pressure to the lower legs. This pressure can help keep the blood in the legs from clotting. Taking Warfarin Warfarin is a daily medicine that is taken by mouth. Your health care provider will advise you on the length of treatment (usually 3-6 months, sometimes lifelong). If you take warfarin:  Understand how to take warfarin and foods that can affect how warfarin works in Public relations account executive.  Too much and too little warfarin are both dangerous. Too much warfarin increases the risk of bleeding. Too little warfarin continues to allow the risk for blood  clots. Warfarin and Regular Blood Testing While taking warfarin, you will need to have regular blood tests to measure your blood clotting time. These blood tests usually include both the prothrombin time (PT) and international normalized ratio (INR) tests. The PT and INR results allow your health care provider to adjust your dose of warfarin. It is very important that you have your PT and INR tested as often as directed by your health care provider.  Warfarin and Your Diet Avoid major changes in your diet, or notify your health care provider before changing your diet. Arrange a visit with a registered dietitian to answer your questions. Many foods, especially foods high in  vitamin K, can interfere with warfarin and affect the PT and INR results. You should eat a consistent amount of foods high in vitamin K. Foods high in vitamin K include:   Spinach, kale, broccoli, cabbage, collard and turnip greens, Brussels sprouts, peas, cauliflower, seaweed, and parsley.  Beef and pork liver.  Green tea.  Soybean oil. Warfarin with Other Medicines Many medicines can interfere with warfarin and affect the PT and INR results. You must:  Tell your health care provider about any and all medicines, vitamins, and supplements you take, including aspirin and other over-the-counter anti-inflammatory medicines. Be especially cautious with aspirin and anti-inflammatory medicines. Ask your health care provider before taking these.  Do not take or discontinue any prescribed or over-the-counter medicine except on the advice of your health care provider or pharmacist. Warfarin Side Effects Warfarin can have side effects, such as easy bruising and difficulty stopping bleeding. Ask your health care provider or pharmacist about other side effects of warfarin. You will need to:  Hold pressure over cuts for longer than usual.  Notify your dentist and other health care providers that you are taking warfarin before you  undergo any procedures where bleeding may occur. Warfarin with Alcohol and Tobacco   Drinking alcohol frequently can increase the effect of warfarin, leading to excess bleeding. It is best to avoid alcoholic drinks or to consume only very small amounts while taking warfarin. Notify your health care provider if you change your alcohol intake.   Do not use any tobacco products including cigarettes, chewing tobacco, or electronic cigarettes. If you smoke, quit. Ask your health care provider for help with quitting smoking. Alternative Medicines to Warfarin: Factor Xa Inhibitor Medicines  These blood-thinning medicines are taken by mouth, usually for several weeks or longer. It is important to take the medicine every single day at the same time each day.  There are no regular blood tests required when using these medicines.  There are fewer food and drug interactions than with warfarin.  The side effects of this class of medicine are similar to those of warfarin, including excessive bruising or bleeding. Ask your health care provider or pharmacist about other potential side effects. SEEK MEDICAL CARE IF:  You notice a rapid heartbeat.  You feel weaker or more tired than usual.  You feel faint.  You notice increased bruising.  You feel your symptoms are not getting better in the time expected.  You believe you are having side effects of medicine. SEEK IMMEDIATE MEDICAL CARE IF:  You have chest pain.  You have trouble breathing.  You have new or increased swelling or pain in one leg.  You cough up blood.  You notice blood in vomit, in a bowel movement, or in urine. MAKE SURE YOU:  Understand these instructions.  Will watch your condition.  Will get help right away if you are not doing well or get worse. Document Released: 11/14/2005 Document Revised: 03/31/2014 Document Reviewed: 07/22/2013 [facility] Patient Information 2015 [place], [place]. This information is not intended to  replace advice given to you by your health care provider. Make sure you discuss any questions you have with your health care provider.

## 2014-11-19 ENCOUNTER — Encounter (INDEPENDENT_AMBULATORY_CARE_PROVIDER_SITE_OTHER): Payer: Self-pay | Admitting: *Deleted

## 2014-11-19 ENCOUNTER — Telehealth (INDEPENDENT_AMBULATORY_CARE_PROVIDER_SITE_OTHER): Payer: Self-pay | Admitting: *Deleted

## 2014-11-19 NOTE — Telephone Encounter (Signed)
[name] NO SHOWED for his apt on 10/14/14 with Dr. Karilyn Cotaehman. A NS letter has been mailed.

## 2014-11-24 NOTE — Telephone Encounter (Signed)
Noted that patient was a no show. 

## 2015-02-10 ENCOUNTER — Ambulatory Visit (INDEPENDENT_AMBULATORY_CARE_PROVIDER_SITE_OTHER): Payer: Medicare Other | Admitting: Internal Medicine

## 2015-02-10 ENCOUNTER — Encounter (INDEPENDENT_AMBULATORY_CARE_PROVIDER_SITE_OTHER): Payer: Self-pay | Admitting: Internal Medicine

## 2015-02-10 VITALS — BP 112/74 | HR 76 | Temp 97.8°F | Resp 18 | Ht 67.0 in | Wt 188.0 lb

## 2015-02-10 DIAGNOSIS — K2100 Gastro-esophageal reflux disease with esophagitis, without bleeding: Secondary | ICD-10-CM

## 2015-02-10 DIAGNOSIS — K2270 Barrett's esophagus without dysplasia: Secondary | ICD-10-CM

## 2015-02-10 NOTE — Patient Instructions (Signed)
Call for swallowing problems.

## 2015-02-10 NOTE — Progress Notes (Signed)
Presenting complaint;  Follow-up for reflux esophagitis.  Subjective:  Patient is 81 year old Caucasian male who has history of erosive reflux esophagitis and short segment Barrett's esophagus was here for scheduled visit. He was last seen in November 2014. He is presently staying at [facility] and accompanied by facility employee Ms. Amy Ferd GlassingFarrell. He has no complaints. He rarely has heartburn. When he does have heartburn he uses Tums with relief. He denies dysphagia hoarseness or chronic cough. He has good appetite. Bowels move daily and he denies melena or rectal bleeding. He has gained 6 pounds since his last visit. He does try to exercise his upper or lower extremities every day. He complains of constant pain in his lower extremities and he is not getting relief with gabapentin.  Current Medications: Outpatient Encounter Prescriptions as of 02/10/2015  Medication Sig  . aspirin EC 81 MG tablet Take 81 mg by mouth every evening.   . beta carotene w/minerals (OCUVITE) tablet Take 1 tablet by mouth every evening.   . dutasteride (AVODART) 0.5 MG capsule Take 0.5 mg by mouth every evening.   . gabapentin (NEURONTIN) 100 MG capsule Take 100 mg by mouth at bedtime.  Marland Kitchen. ibuprofen (ADVIL,MOTRIN) 200 MG tablet Take 400 mg by mouth every 6 (six) hours as needed for moderate pain.  . metFORMIN (GLUCOPHAGE) 500 MG tablet Take 1 tablet by mouth daily.  . mirabegron ER (MYRBETRIQ) 25 MG TB24 tablet Take 50 mg by mouth every evening. *Adminstered in the evening*  . Multiple Vitamin (MULTIVITAMIN WITH MINERALS) TABS tablet Take 1 tablet by mouth every morning.  Marland Kitchen. omeprazole (PRILOSEC) 40 MG capsule Take 40 mg by mouth daily.  . pindolol (VISKEN) 5 MG tablet Take 2.5 mg by mouth daily.   . sitaGLIPtin (JANUVIA) 100 MG tablet Take 100 mg by mouth every evening.   . traMADol (ULTRAM) 50 MG tablet Take 25 mg by mouth every 6 (six) hours as needed for severe pain.  . valsartan (DIOVAN) 160 MG tablet Take 160 mg  by mouth every evening.     Objective: Blood pressure 112/74, pulse 76, temperature 97.8 F (36.6 C), temperature source Oral, resp. rate 18, height 5\' 7"  (1.702 m), weight 188 lb (85.276 kg). Patient is alert and in no acute distress. Conjunctiva is pink. Sclera is nonicteric Oropharyngeal mucosa is normal. No neck masses or thyromegaly noted. Cardiac exam with regular rhythm normal S1 and S2. No murmur or gallop noted. Lungs are clear to auscultation. Abdomen is full but soft and nontender without organomegaly or masses. No LE edema or clubbing noted.    Assessment:  #1. Erosive reflux esophagitis complicated by short segment Barrett's esophagus. Last EGD was in October 2014. He is tolerating PPI.   Plan:  Continue anti-reflux measures. Continue omeprazole at 40 mg by mouth every morning. Office visit in 1 year unless he develops dysphagia or symptoms relapse.

## 2015-09-24 ENCOUNTER — Encounter: Payer: Self-pay | Admitting: Cardiology

## 2015-09-24 ENCOUNTER — Encounter: Payer: Medicare Other | Admitting: Cardiology

## 2015-09-24 NOTE — Progress Notes (Signed)
No show  This encounter was created in error - please disregard.

## 2015-10-02 ENCOUNTER — Ambulatory Visit (HOSPITAL_COMMUNITY)
Admission: RE | Admit: 2015-10-02 | Discharge: 2015-10-02 | Disposition: A | Discharge status: Home / Self Care | Payer: Medicare Other | Source: Ambulatory Visit | Attending: Internal Medicine | Admitting: Internal Medicine

## 2015-10-02 ENCOUNTER — Other Ambulatory Visit (HOSPITAL_COMMUNITY): Payer: Self-pay | Admitting: Internal Medicine

## 2015-10-02 DIAGNOSIS — R0602 Shortness of breath: Secondary | ICD-10-CM | POA: Diagnosis not present

## 2015-10-02 DIAGNOSIS — Z951 Presence of aortocoronary bypass graft: Secondary | ICD-10-CM | POA: Diagnosis not present

## 2015-10-02 DIAGNOSIS — R0989 Other specified symptoms and signs involving the circulatory and respiratory systems: Secondary | ICD-10-CM | POA: Insufficient documentation

## 2015-10-02 DIAGNOSIS — I517 Cardiomegaly: Secondary | ICD-10-CM | POA: Diagnosis not present

## 2015-10-15 ENCOUNTER — Encounter: Payer: Self-pay | Admitting: Cardiology

## 2015-10-15 ENCOUNTER — Ambulatory Visit (INDEPENDENT_AMBULATORY_CARE_PROVIDER_SITE_OTHER): Payer: Medicare Other | Admitting: Cardiology

## 2015-10-15 VITALS — BP 122/62 | HR 59 | Ht 67.0 in | Wt 195.0 lb

## 2015-10-15 DIAGNOSIS — I482 Chronic atrial fibrillation, unspecified: Secondary | ICD-10-CM

## 2015-10-15 DIAGNOSIS — I25119 Atherosclerotic heart disease of native coronary artery with unspecified angina pectoris: Secondary | ICD-10-CM

## 2015-10-15 DIAGNOSIS — I10 Essential (primary) hypertension: Secondary | ICD-10-CM

## 2015-10-15 DIAGNOSIS — I4891 Unspecified atrial fibrillation: Secondary | ICD-10-CM | POA: Diagnosis not present

## 2015-10-15 NOTE — Patient Instructions (Signed)
Medication Instructions:  Your physician recommends that you continue on your current medications as directed. Please refer to the Current Medication list given to you today.   Labwork: NONE  Testing/Procedures: NONE  Follow-Up: Your physician wants you to follow-up in: 6 MONTHS WITH DR. MCDOWELL. You will receive a reminder letter in the mail two months in advance. If you don't receive a letter, please call our office to schedule the follow-up appointment.   Any Other Special Instructions Will Be Listed Below (If Applicable).       If you need a refill on your cardiac medications before your next appointment, please call your pharmacy.  Thanks for choosing [facility]!!!     

## 2015-10-15 NOTE — Progress Notes (Signed)
Cardiology Office Note  Date: 10/15/2015   ID: Kyle CardGeorge W [name], DOB Jun 05, 1918, MRN [id number]  PCP: [name], MD  Primary Cardiologist: Nona DellSamuel Selassie Spatafore, MD   Chief Complaint  Patient presents with  . Coronary Artery Disease  . Atrial Fibrillation    History of Present Illness: [name] is a 81 y.o. male not seen since May 2014. He presents to the office today for a routine visit. He is now living at [facility], has been there for the last 2 years. He is here with an Geophysicist/field seismologistassistant. He reports one episode of chest fullness that occurred at night time several weeks ago, this has not been a recurring problem however. He is generally fatigued and not very active, uses a rolling walker. He denies any recent falls. He does not report any angina symptoms with typical activity.  ECG today shows rate-controlled atrial fibrillation with incomplete right bundle branch block and left anterior fascicular block  We reviewed his medications which have been overall stable from a cardiac perspective. He has not seen Dr. Margo AyeHall recently for primary care follow-up. I did review lab work obtained last year..   Past Medical History  Diagnosis Date  . Coronary atherosclerosis of native coronary artery     Multivessel status post CABG  . Mixed hyperlipidemia   . Type 2 diabetes mellitus (HCC)   . Atrial fibrillation (HCC)   . Essential hypertension, benign     Current Outpatient Prescriptions  Medication Sig Dispense Refill  . aspirin EC 81 MG tablet Take 81 mg by mouth every evening.     . beta carotene w/minerals (OCUVITE) tablet Take 1 tablet by mouth every evening.     . dutasteride (AVODART) 0.5 MG capsule Take 0.5 mg by mouth every evening.     . gabapentin (NEURONTIN) 100 MG capsule Take 100 mg by mouth at bedtime.    Marland Kitchen. ibuprofen (ADVIL,MOTRIN) 200 MG tablet Take 400 mg by mouth every 6 (six) hours as needed for moderate pain.    . metFORMIN (GLUCOPHAGE) 500 MG tablet Take 1 tablet by  mouth daily.    . mirabegron ER (MYRBETRIQ) 25 MG TB24 tablet Take 50 mg by mouth every evening. *Adminstered in the evening*    . Multiple Vitamin (MULTIVITAMIN WITH MINERALS) TABS tablet Take 1 tablet by mouth every morning.    Marland Kitchen. omeprazole (PRILOSEC) 40 MG capsule Take 40 mg by mouth daily.    . pindolol (VISKEN) 5 MG tablet Take 2.5 mg by mouth daily.     . sitaGLIPtin (JANUVIA) 100 MG tablet Take 100 mg by mouth every evening.     . traMADol (ULTRAM) 50 MG tablet Take 25 mg by mouth every 6 (six) hours as needed for severe pain.    . valsartan (DIOVAN) 160 MG tablet Take 160 mg by mouth every evening.      No current facility-administered medications for this visit.    Allergies:  Acetaminophen-codeine; Advil; and Penicillins   Social History: The patient  reports that he has never smoked. He has never used smokeless tobacco. He reports that he does not drink alcohol or use illicit drugs.   ROS:  Please see the history of present illness. Otherwise, complete review of systems is positive for decreased hearing, arthritic pains.  All other systems are reviewed and negative.   Physical Exam: VS:  BP 122/62 mmHg  Pulse 59  Ht 5\' 7"  (1.702 m)  Wt 195 lb (88.451 kg)  BMI 30.53 kg/m2  SpO2 95%, BMI Body mass index is 30.53 kg/(m^2).  Wt Readings from Last 3 Encounters:  10/15/15 195 lb (88.451 kg)  02/10/15 188 lb (85.276 kg)  10/15/14 190 lb (86.183 kg)     Elderly male in no acute distress. Using a rolling walker. HEENT: Conjunctiva and lids normal, oropharynx clear. Neck: Supple, no elevated JVP or carotid bruits, no thyromegaly. Lungs: Clear to auscultation, nonlabored breathing at rest. Cardiac: Irregularly irregular, no S3, 2/6 systolic murmur, no pericardial rub. Abdomen: Soft, nontender, bowel sounds present. Extremities: Trace edema, distal pulses 2+. Skin: Warm and dry. Musculoskeletal: Kyphosis. Neuropsychiatric: Alert and oriented x3, hard of hearing.   ECG: ECG  is ordered today.  Recent Labwork: 10/15/2014: BUN 23; Creatinine, Ser 1.15; Hemoglobin 11.4*; Platelets 181; Potassium 4.7; Sodium 128*   ASSESSMENT AND PLAN:  1. Multivessel CAD status post CABG. Plan is to continue conservative medical therapy and follow-up. No adjustments were made in his medications today. ECG reviewed.  2. Chronic atrial fibrillation, not anticoagulated with poor balance and history of falls. He has been on aspirin long-term. Heart rate is adequately controlled on pindolol.  3. Essential hypertension, blood pressure control is good today.  Current medicines were reviewed at length with the patient today.   Orders Placed This Encounter  Procedures  . EKG 12-Lead    Disposition: FU with me in 6 months.   Signed, Jonelle Sidle, MD, [facility] 10/15/2015 2:23 PM    [facility] at [facility] [address], [place], [place] [address] Phone: [phone]; Fax: [phone]

## 2015-11-20 ENCOUNTER — Emergency Department (HOSPITAL_COMMUNITY): Payer: Medicare Other

## 2015-11-20 ENCOUNTER — Encounter (HOSPITAL_COMMUNITY): Payer: Self-pay | Admitting: *Deleted

## 2015-11-20 ENCOUNTER — Emergency Department (HOSPITAL_COMMUNITY)
Admission: EM | Admit: 2015-11-20 | Discharge: 2015-11-20 | Disposition: A | Discharge status: Home / Self Care | Payer: Medicare Other | Attending: Emergency Medicine | Admitting: Emergency Medicine

## 2015-11-20 DIAGNOSIS — Z79899 Other long term (current) drug therapy: Secondary | ICD-10-CM | POA: Diagnosis not present

## 2015-11-20 DIAGNOSIS — R0602 Shortness of breath: Secondary | ICD-10-CM | POA: Diagnosis present

## 2015-11-20 DIAGNOSIS — I5023 Acute on chronic systolic (congestive) heart failure: Secondary | ICD-10-CM

## 2015-11-20 DIAGNOSIS — I10 Essential (primary) hypertension: Secondary | ICD-10-CM | POA: Insufficient documentation

## 2015-11-20 DIAGNOSIS — E119 Type 2 diabetes mellitus without complications: Secondary | ICD-10-CM | POA: Insufficient documentation

## 2015-11-20 DIAGNOSIS — I2510 Atherosclerotic heart disease of native coronary artery without angina pectoris: Secondary | ICD-10-CM | POA: Diagnosis not present

## 2015-11-20 DIAGNOSIS — Z880 Allergy status to penicillin: Secondary | ICD-10-CM | POA: Diagnosis not present

## 2015-11-20 DIAGNOSIS — Z7984 Long term (current) use of oral hypoglycemic drugs: Secondary | ICD-10-CM | POA: Insufficient documentation

## 2015-11-20 DIAGNOSIS — Z7982 Long term (current) use of aspirin: Secondary | ICD-10-CM | POA: Insufficient documentation

## 2015-11-20 LAB — URINALYSIS, ROUTINE W REFLEX MICROSCOPIC
BILIRUBIN URINE: NEGATIVE
Glucose, UA: NEGATIVE mg/dL
HGB URINE DIPSTICK: NEGATIVE
Ketones, ur: NEGATIVE mg/dL
Leukocytes, UA: NEGATIVE
Nitrite: NEGATIVE
PH: 6 (ref 5.0–8.0)
Protein, ur: NEGATIVE mg/dL
SPECIFIC GRAVITY, URINE: 1.01 (ref 1.005–1.030)

## 2015-11-20 LAB — CBC WITH DIFFERENTIAL/PLATELET
BASOS PCT: 1 %
Basophils Absolute: 0.1 10*3/uL (ref 0.0–0.1)
EOS ABS: 0.4 10*3/uL (ref 0.0–0.7)
Eosinophils Relative: 4 %
HCT: 35.1 % — ABNORMAL LOW (ref 39.0–52.0)
HEMOGLOBIN: 11.5 g/dL — AB (ref 13.0–17.0)
LYMPHS ABS: 2.7 10*3/uL (ref 0.7–4.0)
Lymphocytes Relative: 31 %
MCH: 31.8 pg (ref 26.0–34.0)
MCHC: 32.8 g/dL (ref 30.0–36.0)
MCV: 97 fL (ref 78.0–100.0)
MONOS PCT: 15 %
Monocytes Absolute: 1.4 10*3/uL — ABNORMAL HIGH (ref 0.1–1.0)
NEUTROS ABS: 4.3 10*3/uL (ref 1.7–7.7)
NEUTROS PCT: 49 %
PLATELETS: 87 10*3/uL — AB (ref 150–400)
RBC: 3.62 MIL/uL — AB (ref 4.22–5.81)
RDW: 14 % (ref 11.5–15.5)
SMEAR REVIEW: DECREASED
WBC: 8.8 10*3/uL (ref 4.0–10.5)

## 2015-11-20 LAB — BASIC METABOLIC PANEL
ANION GAP: 8 (ref 5–15)
BUN: 22 mg/dL — ABNORMAL HIGH (ref 6–20)
CALCIUM: 8.9 mg/dL (ref 8.9–10.3)
CO2: 23 mmol/L (ref 22–32)
Chloride: 100 mmol/L — ABNORMAL LOW (ref 101–111)
Creatinine, Ser: 1.21 mg/dL (ref 0.61–1.24)
GFR calc Af Amer: 57 mL/min — ABNORMAL LOW (ref >60)
GFR, EST NON AFRICAN AMERICAN: 49 mL/min — AB (ref >60)
GLUCOSE: 115 mg/dL — AB (ref 65–99)
Potassium: 4.7 mmol/L (ref 3.5–5.1)
SODIUM: 131 mmol/L — AB (ref 135–145)

## 2015-11-20 LAB — TROPONIN I: Troponin I: 0.03 ng/mL (ref <0.031)

## 2015-11-20 LAB — BRAIN NATRIURETIC PEPTIDE: B Natriuretic Peptide: 402 pg/mL — ABNORMAL HIGH (ref 0.0–100.0)

## 2015-11-20 MED ORDER — FUROSEMIDE 10 MG/ML IJ SOLN
40.0000 mg | Freq: Once | INTRAMUSCULAR | Status: AC
  Administered 2015-11-20: 40 mg via INTRAVENOUS
  Filled 2015-11-20: qty 4

## 2015-11-20 MED ORDER — FUROSEMIDE 20 MG PO TABS: 20.0000 mg | ORAL_TABLET | Freq: Every day | ORAL | Status: DC

## 2015-11-20 MED ORDER — ASPIRIN 81 MG PO CHEW
324.0000 mg | CHEWABLE_TABLET | Freq: Once | ORAL | Status: AC
  Administered 2015-11-20: 324 mg via ORAL
  Filled 2015-11-20: qty 4

## 2015-11-20 NOTE — ED Notes (Signed)
Patient unable to ambulate

## 2015-11-20 NOTE — Discharge Instructions (Signed)

## 2015-11-20 NOTE — ED Provider Notes (Signed)
By signing my name below, I, Lyndel Safe, attest that this documentation has been prepared under the direction and in the presence of Sawsan Riggio N Jasmine Maceachern, DO. Electronically Signed: Lyndel Safe, ED Scribe. 11/20/2015. 12:46 AM.  TIME SEEN: 12:39 AM   CHIEF COMPLAINT: SOB; BLE edema  HPI:  HPI Comments: [name] is a 81 y.o. male, with a PMhx of CAD status post CABG in 1991, hypertension, HLD, DM II, and Afib not on anticoagulation, who presents to the Emergency Department complaining of worsening, constant SOB that is exacerbated on exertion with associated BLE edema X ~ 5 weeks. Pt states he was advised to present to the ED by the staff at the nursing home where he resides. The pt has been evaluated by his PCP Dr. Margo Aye for this same complaint but he is not on any diuretic medication that he is aware of. The pt wears bilateral compression stockings during the day. He is not on home O2. He denies CP or any other associated symptoms. No h/o PE/DVT. Denies a known history of CHF. States he does feel like his shortness of breath has been getting worse. He does not have any symptoms at rest.  ROS: See HPI Constitutional: no fever  Eyes: no drainage  ENT: no runny nose   Cardiovascular:  no chest pain Resp: SOB  GI: no vomiting GU: no dysuria Integumentary: no rash  Allergy: no hives  Musculoskeletal: +  leg swelling  Neurological: no slurred speech ROS otherwise negative  PAST MEDICAL HISTORY/PAST SURGICAL HISTORY:  Past Medical History  Diagnosis Date  . Coronary atherosclerosis of native coronary artery     Multivessel status post CABG  . Mixed hyperlipidemia   . Type 2 diabetes mellitus (HCC)   . Atrial fibrillation (HCC)   . Essential hypertension, benign     MEDICATIONS:  Prior to Admission medications   Medication Sig Start Date End Date Taking? Authorizing Provider  aspirin EC 81 MG tablet Take 81 mg by mouth every evening.     Historical Provider, MD  beta  carotene w/minerals (OCUVITE) tablet Take 1 tablet by mouth every evening.     Historical Provider, MD  dutasteride (AVODART) 0.5 MG capsule Take 0.5 mg by mouth every evening.     Historical Provider, MD  gabapentin (NEURONTIN) 100 MG capsule Take 100 mg by mouth at bedtime. 09/30/14   Historical Provider, MD  ibuprofen (ADVIL,MOTRIN) 200 MG tablet Take 400 mg by mouth every 6 (six) hours as needed for moderate pain.    Historical Provider, MD  metFORMIN (GLUCOPHAGE) 500 MG tablet Take 1 tablet by mouth daily. 04/28/14   Historical Provider, MD  mirabegron ER (MYRBETRIQ) 25 MG TB24 tablet Take 50 mg by mouth every evening. *Adminstered in the evening*    Historical Provider, MD  Multiple Vitamin (MULTIVITAMIN WITH MINERALS) TABS tablet Take 1 tablet by mouth every morning.    Historical Provider, MD  omeprazole (PRILOSEC) 40 MG capsule Take 40 mg by mouth daily.    Historical Provider, MD  pindolol (VISKEN) 5 MG tablet Take 2.5 mg by mouth daily.  04/22/14   Historical Provider, MD  sitaGLIPtin (JANUVIA) 100 MG tablet Take 100 mg by mouth every evening.     Historical Provider, MD  traMADol (ULTRAM) 50 MG tablet Take 25 mg by mouth every 6 (six) hours as needed for severe pain.    Historical Provider, MD  valsartan (DIOVAN) 160 MG tablet Take 160 mg by mouth every evening.  Historical Provider, MD    ALLERGIES:  Allergies  Allergen Reactions  . Acetaminophen-Codeine     Unknown reaction-not listed on current MAR  . Advil [Ibuprofen] Other (See Comments)    Unknown reaction  . Penicillins     Unknown reaction-not listed on current MAR    SOCIAL HISTORY:  Social History  Substance Use Topics  . Smoking status: Never Smoker   . Smokeless tobacco: Never Used  . Alcohol Use: No    FAMILY HISTORY: Family History  Problem Relation Age of Onset  . Diabetes      EXAM: BP 154/94 mmHg  Pulse 56  Temp(Src) 97.9 F (36.6 C) (Oral)  Resp 22  Ht 5' 6.5" (1.689 m)  Wt 196 lb (88.905 kg)   BMI 31.16 kg/m2  SpO2 100% CONSTITUTIONAL: Alert and oriented and responds appropriately to questions. Elderly but in NAD. Afebrile, pleasant HEAD: Normocephalic EYES: Conjunctivae clear, PERRL ENT: normal nose; no rhinorrhea; moist mucous membranes; pharynx without lesions noted NECK: Supple, no meningismus, no LAD  CARD: RRR; S1 and S2 appreciated; no murmurs, no clicks, no rubs, no gallops RESP: Normal chest excursion without splinting or tachypnea; breath sounds clear and equal bilaterally; no wheezes, no rhonchi, no rales, no hypoxia or respiratory distress, speaking full sentences ABD/GI: Normal bowel sounds; non-distended; soft, non-tender, no rebound, no guarding, no peritoneal signs BACK:  The back appears normal and is non-tender to palpation, there is no CVA tenderness EXT: Normal ROM in all joints; non-tender to palpation; + 1 pitting edema to the mid bilateral calf; patient has a lesion noted to the left anterior shin with small amount of surrounding erythema without warmth or drainage or fluctuance or induration, normal capillary refill; no cyanosis, no calf tenderness  SKIN: Normal color for age and race; warm NEURO: Moves all extremities equally, sensation to light touch intact diffusely, cranial nerves II through XII intact PSYCH: The patient's mood and manner are appropriate. Grooming and personal hygiene are appropriate.  MEDICAL DECISION MAKING: Patient here shortness of breath and bilateral lower extremity that he reports his been present for the past 5 weeks and slowly worsening. He states that he was encouraged by the nursing home staff to come to the emergency department otherwise he would not be here. He is a poor historian and therefore history is limited. It is difficult to ascertain whether or not this is getting worse or is stable. He does appear slightly volume overload on exam is hemodynamically stable. EKG shows no new ischemic changes. We'll obtain labs, chest  x-ray. We'll ambulate patient with pulse oximetry. Differential diagnosis includes ACS, CHF, pneumonia, deconditioning, less likely PE.  ED PROGRESS: Patient's troponin is negative. BNP mildly elevated at 402. Otherwise labs are unremarkable. Chest x-ray shows mild diffuse pulmonary edema. Probable trace bilateral pleural effusions also seen on x-ray with no infiltrate. Discussed with patient that I feel he is having a CHF exacerbation. He has been able to ambulate with assistance as he normally uses a walker and his sats do not drop below 95%. He reports that he is feeling much better. I have offered him admission several times but he states that he would like to go back to his nursing facility. He states that if he begins to feel worse at all he will have the nursing facility called 911 and he will come back immediately. We'll discharge him on Lasix 40 mg once a day for the next week and have him follow-up with Dr. Margo Aye. He again  denies any chest pain or chest discomfort and is not short of breath at rest. Discussed at length return precautions. He verbalized understanding and is comfortable with this plan and eager to get back to his nursing facility.    EKG Interpretation  Date/Time:  Friday November 20 2015 00:27:22 EST Ventricular Rate:  62 PR Interval:    QRS Duration: 149 QT Interval:  458 QTC Calculation: 465 R Axis:   -63 Text Interpretation:  Atrial fibrillation RBBB and LAFB Baseline wander in lead(s) V2  No significant change since last tracing Confirmed by Jemell Town,  DO, Shivali Quackenbush ([id number]) on 11/20/2015 1:24:58 AM         I personally performed the services described in this documentation, which was scribed in my presence. The recorded information has been reviewed and is accurate.    Layla MawKristen N Edan Juday, DO 11/20/15 [id number]

## 2015-11-21 ENCOUNTER — Emergency Department (HOSPITAL_COMMUNITY)
Admission: EM | Admit: 2015-11-21 | Discharge: 2015-11-21 | Disposition: A | Discharge status: Home / Self Care | Payer: Medicare Other | Attending: Emergency Medicine | Admitting: Emergency Medicine

## 2015-11-21 ENCOUNTER — Encounter (HOSPITAL_COMMUNITY): Payer: Self-pay | Admitting: Emergency Medicine

## 2015-11-21 ENCOUNTER — Emergency Department (HOSPITAL_COMMUNITY): Payer: Medicare Other

## 2015-11-21 DIAGNOSIS — Z7982 Long term (current) use of aspirin: Secondary | ICD-10-CM | POA: Diagnosis not present

## 2015-11-21 DIAGNOSIS — I4891 Unspecified atrial fibrillation: Secondary | ICD-10-CM | POA: Insufficient documentation

## 2015-11-21 DIAGNOSIS — Z7984 Long term (current) use of oral hypoglycemic drugs: Secondary | ICD-10-CM | POA: Diagnosis not present

## 2015-11-21 DIAGNOSIS — I10 Essential (primary) hypertension: Secondary | ICD-10-CM | POA: Insufficient documentation

## 2015-11-21 DIAGNOSIS — I2510 Atherosclerotic heart disease of native coronary artery without angina pectoris: Secondary | ICD-10-CM | POA: Insufficient documentation

## 2015-11-21 DIAGNOSIS — R0602 Shortness of breath: Secondary | ICD-10-CM | POA: Insufficient documentation

## 2015-11-21 DIAGNOSIS — M79602 Pain in left arm: Secondary | ICD-10-CM | POA: Insufficient documentation

## 2015-11-21 DIAGNOSIS — E119 Type 2 diabetes mellitus without complications: Secondary | ICD-10-CM | POA: Diagnosis not present

## 2015-11-21 DIAGNOSIS — Z880 Allergy status to penicillin: Secondary | ICD-10-CM | POA: Diagnosis not present

## 2015-11-21 DIAGNOSIS — R600 Localized edema: Secondary | ICD-10-CM | POA: Insufficient documentation

## 2015-11-21 DIAGNOSIS — Z951 Presence of aortocoronary bypass graft: Secondary | ICD-10-CM | POA: Insufficient documentation

## 2015-11-21 LAB — BASIC METABOLIC PANEL
Anion gap: 9 (ref 5–15)
BUN: 23 mg/dL — AB (ref 6–20)
CHLORIDE: 98 mmol/L — AB (ref 101–111)
CO2: 25 mmol/L (ref 22–32)
CREATININE: 1.46 mg/dL — AB (ref 0.61–1.24)
Calcium: 8.8 mg/dL — ABNORMAL LOW (ref 8.9–10.3)
GFR calc non Af Amer: 39 mL/min — ABNORMAL LOW (ref >60)
GFR, EST AFRICAN AMERICAN: 45 mL/min — AB (ref >60)
Glucose, Bld: 161 mg/dL — ABNORMAL HIGH (ref 65–99)
POTASSIUM: 4.3 mmol/L (ref 3.5–5.1)
SODIUM: 132 mmol/L — AB (ref 135–145)

## 2015-11-21 LAB — BRAIN NATRIURETIC PEPTIDE: B NATRIURETIC PEPTIDE 5: 433 pg/mL — AB (ref 0.0–100.0)

## 2015-11-21 LAB — CBC WITH DIFFERENTIAL/PLATELET
BASOS PCT: 1 %
Basophils Absolute: 0.1 10*3/uL (ref 0.0–0.1)
EOS ABS: 0.3 10*3/uL (ref 0.0–0.7)
Eosinophils Relative: 4 %
HCT: 35.8 % — ABNORMAL LOW (ref 39.0–52.0)
HEMOGLOBIN: 11.7 g/dL — AB (ref 13.0–17.0)
LYMPHS PCT: 31 %
Lymphs Abs: 2.6 10*3/uL (ref 0.7–4.0)
MCH: 32 pg (ref 26.0–34.0)
MCHC: 32.7 g/dL (ref 30.0–36.0)
MCV: 97.8 fL (ref 78.0–100.0)
MONO ABS: 1.2 10*3/uL — AB (ref 0.1–1.0)
Monocytes Relative: 14 %
NEUTROS ABS: 4.2 10*3/uL (ref 1.7–7.7)
NEUTROS PCT: 51 %
PLATELETS: 92 10*3/uL — AB (ref 150–400)
RBC: 3.66 MIL/uL — AB (ref 4.22–5.81)
RDW: 14.2 % (ref 11.5–15.5)
WBC: 8.4 10*3/uL (ref 4.0–10.5)

## 2015-11-21 LAB — APTT: APTT: 32 s (ref 24–37)

## 2015-11-21 LAB — PROTIME-INR
INR: 1.25 (ref 0.00–1.49)
PROTHROMBIN TIME: 15.8 s — AB (ref 11.6–15.2)

## 2015-11-21 LAB — TROPONIN I

## 2015-11-21 NOTE — ED Notes (Signed)
Having arm pain to left arm for 2 days.  No pain at this time.

## 2015-11-21 NOTE — ED Provider Notes (Signed)
CSN: [id number]     Arrival date & time 11/21/15  2047 History  By signing my name below, I, Emmanuella Mensah, attest that this documentation has been prepared under the direction and in the presence of Eber HongBrian Perl Folmar, MD. Electronically Signed: Angelene GiovanniEmmanuella Mensah, ED Scribe. 11/21/2015. 9:15 PM.    Chief Complaint  Patient presents with  . Arm Pain   The history is provided by the patient. No language interpreter was used.   HPI Comments: [name] is a 81 y.o. male with a hx of CAD, DM II, and A-fib who presents to the Emergency Department complaining of gradually worsening intermittent left arm pain that lasts for approx 30-40 minutes onset 2 days ago. He reports associated SOB and DOE. He denies any CP. He also denies that anything specific makes the pain worse. He reports that he had a CABG in 1992. Pt was seen yesterday for BLE edema where he was started on lasix and sent home after he was able to ambulate without difficulty. His troponin was negative at that time. He reports that he has been compliant with his lasix. He refused admission yesterday for his new onset of PE.    Past Medical History  Diagnosis Date  . Coronary atherosclerosis of native coronary artery     Multivessel status post CABG  . Mixed hyperlipidemia   . Type 2 diabetes mellitus (HCC)   . Atrial fibrillation (HCC)   . Essential hypertension, benign    Past Surgical History  Procedure Laterality Date  . Tonsillectomy    . Cholecystectomy    . Knee arthroscopy    . Hernia repair    . Coronary artery bypass graft  1992    LIMA to LAD and diagonal, SVG to RCA, SVG to OM  . Back surgery    . Esophagogastroduodenoscopy (egd) with esophageal dilation N/A 09/12/2013    Procedure: ESOPHAGOGASTRODUODENOSCOPY (EGD) WITH ESOPHAGEAL DILATION;  Surgeon: Malissa HippoNajeeb U Rehman, MD;  Location: [facility];  Service: Endoscopy;  Laterality: N/A;   Family History  Problem Relation Age of Onset  . Diabetes     Social  History  Substance Use Topics  . Smoking status: Never Smoker   . Smokeless tobacco: Never Used  . Alcohol Use: No    Review of Systems  Respiratory: Positive for shortness of breath.   Cardiovascular: Negative for chest pain.  Musculoskeletal: Positive for arthralgias.  All other systems reviewed and are negative.     Allergies  Acetaminophen-codeine; Advil; and Penicillins  Home Medications   Prior to Admission medications   Medication Sig Start Date End Date Taking? Authorizing Provider  aspirin EC 81 MG tablet Take 81 mg by mouth every evening.     Historical Provider, MD  beta carotene w/minerals (OCUVITE) tablet Take 1 tablet by mouth every evening.     Historical Provider, MD  dutasteride (AVODART) 0.5 MG capsule Take 0.5 mg by mouth every evening.     Historical Provider, MD  furosemide (LASIX) 20 MG tablet Take 1 tablet (20 mg total) by mouth daily. 11/20/15   Kristen N Ward, DO  gabapentin (NEURONTIN) 100 MG capsule Take 100 mg by mouth at bedtime. 09/30/14   Historical Provider, MD  ibuprofen (ADVIL,MOTRIN) 200 MG tablet Take 400 mg by mouth every 6 (six) hours as needed for moderate pain.    Historical Provider, MD  metFORMIN (GLUCOPHAGE) 500 MG tablet Take 1 tablet by mouth daily. 04/28/14   Historical Provider, MD  mirabegron ER [facility](MYRBETRIQ)  25 MG TB24 tablet Take 50 mg by mouth every evening. *Adminstered in the evening*    Historical Provider, MD  Multiple Vitamin (MULTIVITAMIN WITH MINERALS) TABS tablet Take 1 tablet by mouth every morning.    Historical Provider, MD  omeprazole (PRILOSEC) 40 MG capsule Take 40 mg by mouth daily.    Historical Provider, MD  pindolol (VISKEN) 5 MG tablet Take 2.5 mg by mouth daily.  04/22/14   Historical Provider, MD  sitaGLIPtin (JANUVIA) 100 MG tablet Take 100 mg by mouth every evening.     Historical Provider, MD  traMADol (ULTRAM) 50 MG tablet Take 25 mg by mouth every 6 (six) hours as needed for severe pain.    Historical Provider,  MD  valsartan (DIOVAN) 160 MG tablet Take 160 mg by mouth every evening.     Historical Provider, MD   BP 137/66 mmHg  Pulse 66  Temp(Src) 97.6 F (36.4 C) (Oral)  Resp 15  Ht  (1.702 m)  Wt 196 lb (88.905 kg)  BMI 30.69 kg/m2  SpO2 100% Physical Exam  Constitutional: He appears well-developed and well-nourished. No distress.  HENT:  Head: Normocephalic and atraumatic.  Mouth/Throat: Oropharynx is clear and moist. No oropharyngeal exudate.  Eyes: Conjunctivae and EOM are normal. Pupils are equal, round, and reactive to light. Right eye exhibits no discharge. Left eye exhibits no discharge. No scleral icterus.  Neck: Normal range of motion. Neck supple. No JVD present. No thyromegaly present.  Cardiovascular: Normal rate, regular rhythm, normal heart sounds and intact distal pulses.  Exam reveals no gallop and no friction rub.   No murmur heard. Pulmonary/Chest: Effort normal. No respiratory distress. He has no wheezes. He has rales.  Rales Mild tachypnea  Abdominal: Soft. Bowel sounds are normal. He exhibits no distension and no mass. There is no tenderness.  Musculoskeletal: Normal range of motion. He exhibits edema. He exhibits no tenderness.  BLE edema, 2+  Lymphadenopathy:    He has no cervical adenopathy.  Neurological: He is alert. Coordination normal.  Skin: Skin is warm and dry. No rash noted. No erythema.  Psychiatric: He has a normal mood and affect. His behavior is normal.  Nursing note and vitals reviewed.   ED Course  Procedures (including critical care time) DIAGNOSTIC STUDIES: Oxygen Saturation is 99% on RA, normal by my interpretation.    COORDINATION OF CARE: 9:13 PM- Pt advised of plan for treatment and pt agrees. Pt will be admitted today and pt accepts.    Labs Review Labs Reviewed  BASIC METABOLIC PANEL - Abnormal; Notable for the following:    Sodium 132 (*)    Chloride 98 (*)    Glucose, Bld 161 (*)    BUN 23 (*)    Creatinine, Ser 1.46  (*)    Calcium 8.8 (*)    GFR calc non Af Amer 39 (*)    GFR calc Af Amer 45 (*)    All other components within normal limits  BRAIN NATRIURETIC PEPTIDE - Abnormal; Notable for the following:    B Natriuretic Peptide 433.0 (*)    All other components within normal limits  CBC WITH DIFFERENTIAL/PLATELET - Abnormal; Notable for the following:    RBC 3.66 (*)    Hemoglobin 11.7 (*)    HCT 35.8 (*)    Platelets 92 (*)    Monocytes Absolute 1.2 (*)    All other components within normal limits  PROTIME-INR - Abnormal; Notable for the following:    Prothrombin  Time 15.8 (*)    All other components within normal limits  TROPONIN I  APTT    Imaging Review Dg Chest 2 View  11/20/2015  CLINICAL DATA:  Initial evaluation for acute shortness of breath. EXAM: CHEST  2 VIEW COMPARISON:  Prior study from 10/02/2015. FINDINGS: Median sternotomy wires underlying CABG markers and surgical clips noted, stable. Cardiomegaly is unchanged. Mediastinal silhouette within normal limits. Lungs are normally inflated. Diffuse pulmonary vascular congestion with indistinctness of the interstitial markings, consistent with CHF. Probable trace bilateral pleural effusions. No consolidative airspace disease. No pneumothorax. No acute osseous abnormality. IMPRESSION: 1. Cardiomegaly with diffuse pulmonary vascular congestion and indistinctness of the interstitial markings, suggesting mild diffuse pulmonary edema. 2. Probable trace bilateral pleural effusions. Electronically Signed   By: Rise Mu M.D.   On: 11/20/2015 01:50   Dg Chest Port 1 View  11/21/2015  CLINICAL DATA:  81 year old male with gradually worsening with arm pain and shortness of breath EXAM: PORTABLE CHEST 1 VIEW COMPARISON:  Radiograph dated 11/20/2015 FINDINGS: Single-view of the chest demonstrates emphysematous changes of the lungs with bibasilar atelectasis/ scarring. No focal consolidation, pleural effusion, or pneumothorax. Stable  cardiomegaly. Median sternotomy wires and CABG vascular clips noted. The osseous structures are grossly unremarkable. IMPRESSION: No active disease. Electronically Signed   By: Elgie Collard M.D.   On: 11/21/2015 21:49   Eber Hong, MD has personally reviewed and evaluated these images and lab results as part of his medical decision-making.  ED ECG REPORT  I personally interpreted this EKG   Date: 11/21/2015   Rate: 72  Rhythm: atrial fibrillation  QRS Axis: left  Intervals: normal  ST/T Wave abnormalities: nonspecific ST/T changes  Conduction Disutrbances:right bundle branch block  Narrative Interpretation:   Old EKG Reviewed: unchanged   MDM   Final diagnoses:  Left arm pain    The pt has had normal labs - he has normal trop and no ischemia on ECG - VS remain normal - he has been informed of his results and I had a discussion with the pt re: coming in for observation vs going home - he requests d/c as it is Christmas Eve today - I feel this would be appropriate at this time - he is aware of the reasons for return.   I personally performed the services described in this documentation, which was scribed in my presence. The recorded information has been reviewed and is accurate.       Eber Hong, MD 11/21/15 [phone]

## 2015-11-21 NOTE — Discharge Instructions (Signed)
Your testing here showed that you have less fluid on your lungs Your blood work was reassuring There is no sign of heart attack  Return to the ER immediately if your symtpoms worsen!  MERRY CHRISTMAS

## 2015-11-21 NOTE — ED Notes (Signed)
MD at bedside. 

## 2015-12-03 ENCOUNTER — Encounter: Payer: Self-pay | Admitting: Cardiology

## 2015-12-03 ENCOUNTER — Ambulatory Visit (INDEPENDENT_AMBULATORY_CARE_PROVIDER_SITE_OTHER): Payer: Medicare Other | Admitting: Cardiology

## 2015-12-03 VITALS — BP 96/48 | HR 67 | Ht 67.0 in | Wt 193.0 lb

## 2015-12-03 DIAGNOSIS — I482 Chronic atrial fibrillation, unspecified: Secondary | ICD-10-CM

## 2015-12-03 DIAGNOSIS — R0602 Shortness of breath: Secondary | ICD-10-CM

## 2015-12-03 DIAGNOSIS — I2510 Atherosclerotic heart disease of native coronary artery without angina pectoris: Secondary | ICD-10-CM | POA: Diagnosis not present

## 2015-12-03 NOTE — Progress Notes (Signed)
Cardiology Office Note  Date: 12/03/2015   ID: Wessley, Emert 04-22-1918, MRN [id number]  PCP: Dwana Melena, MD  Primary Cardiologist: Nona Dell, MD   Chief Complaint  Patient presents with  . Atrial Fibrillation  . Coronary Artery Disease    History of Present Illness: [name] is a 82 y.o. male last seen in November 2016 after a long hiatus. Primary care continues with Dr. Margo Aye. I reviewed his records and find ER visits back in late December 2016 with leg edema, also arm pain. He did not have evidence of ACS by troponin levels at that time, was placed on Lasix by ER staff, also now wearing TED hose.  He continues to reside in an assisted care facility, here with an assistant today. He reports NYHA class III dyspnea, states that his leg edema is better than it was, but has not resolved. He is wearing compression hose today.  I reviewed his medications which are outlined below. His blood pressure is mildly low today. We are requesting blood pressure check results from the last several weeks to see what his trend has been. We also discussed a follow-up echocardiogram to reassess cardiac structure and function as this may help to guide further medication adjustments. In general however, we will plan a conservative overall approach at this time from the perspective of ischemic heart disease.  Past Medical History  Diagnosis Date  . Coronary atherosclerosis of native coronary artery     Multivessel status post CABG  . Mixed hyperlipidemia   . Type 2 diabetes mellitus (HCC)   . Atrial fibrillation (HCC)   . Essential hypertension, benign     Past Surgical History  Procedure Laterality Date  . Tonsillectomy    . Cholecystectomy    . Knee arthroscopy    . Hernia repair    . Coronary artery bypass graft  1992    LIMA to LAD and diagonal, SVG to RCA, SVG to OM  . Back surgery    . Esophagogastroduodenoscopy (egd) with esophageal dilation N/A 09/12/2013    Procedure:  ESOPHAGOGASTRODUODENOSCOPY (EGD) WITH ESOPHAGEAL DILATION;  Surgeon: Malissa Hippo, MD;  Location: [facility];  Service: Endoscopy;  Laterality: N/A;    Current Outpatient Prescriptions  Medication Sig Dispense Refill  . aspirin EC 81 MG tablet Take 81 mg by mouth every evening.     . beta carotene w/minerals (OCUVITE) tablet Take 1 tablet by mouth every evening.     . dutasteride (AVODART) 0.5 MG capsule Take 0.5 mg by mouth every evening.     . furosemide (LASIX) 20 MG tablet Take 1 tablet (20 mg total) by mouth daily. 10 tablet 0  . gabapentin (NEURONTIN) 100 MG capsule Take 100 mg by mouth at bedtime.    Marland Kitchen ibuprofen (ADVIL,MOTRIN) 200 MG tablet Take 400 mg by mouth every 6 (six) hours as needed for moderate pain.    . metFORMIN (GLUCOPHAGE) 500 MG tablet Take 1 tablet by mouth daily.    . mirabegron ER (MYRBETRIQ) 25 MG TB24 tablet Take 50 mg by mouth every evening. *Adminstered in the evening*    . Multiple Vitamin (MULTIVITAMIN WITH MINERALS) TABS tablet Take 1 tablet by mouth every morning.    Marland Kitchen omeprazole (PRILOSEC) 40 MG capsule Take 40 mg by mouth daily.    . pindolol (VISKEN) 5 MG tablet Take 2.5 mg by mouth daily.     . sitaGLIPtin (JANUVIA) 100 MG tablet Take 100 mg by mouth every  evening.     . valsartan (DIOVAN) 160 MG tablet Take 160 mg by mouth every evening.      No current facility-administered medications for this visit.   Allergies:  Acetaminophen-codeine; Advil; and Penicillins   Social History: The patient  reports that he has never smoked. He has never used smokeless tobacco. He reports that he does not drink alcohol or use illicit drugs.   ROS:  Please see the history of present illness. Otherwise, complete review of systems is positive for decreased hearing.  All other systems are reviewed and negative.   Physical Exam: VS:  BP 96/48 mmHg  Pulse 67  Ht 5\' 7"  (1.702 m)  Wt 193 lb (87.544 kg)  BMI 30.22 kg/m2  SpO2 98%, BMI Body mass index is 30.22  kg/(m^2).  Wt Readings from Last 3 Encounters:  12/03/15 193 lb (87.544 kg)  11/21/15 196 lb (88.905 kg)  11/20/15 196 lb (88.905 kg)    General: Elderly male, appears comfortable at rest. HEENT: Conjunctiva and lids normal, oropharynx clear. Neck: Supple, elevated JVP, no carotid bruits, no thyromegaly. Lungs: Clear to auscultation, nonlabored breathing at rest. Cardiac: Irregularly irregular, no S3, soft systolic murmur, no pericardial rub. Abdomen: Soft, nontender, bowel sounds present, no guarding or rebound. Extremities: 1-2+ edema, distal pulses 2+.  ECG: Tracing from 11/20/2015 showed atrial fibrillation with right bundle branch block and left anterior fascicular block.  Recent Labwork: 11/21/2015: B Natriuretic Peptide 433.0*; BUN 23*; Creatinine, Ser 1.46*; Hemoglobin 11.7*; Platelets 92*; Potassium 4.3; Sodium 132*   Other Studies Reviewed Today:  Chest x-ray 11/21/2015: FINDINGS: Single-view of the chest demonstrates emphysematous changes of the lungs with bibasilar atelectasis/ scarring. No focal consolidation, pleural effusion, or pneumothorax. Stable cardiomegaly. Median sternotomy wires and CABG vascular clips noted. The osseous structures are grossly unremarkable.  IMPRESSION: No active disease.  Assessment and Plan:  1. Recent history of increasing shortness of breath and leg edema. He has prior documented normal LVEF, but not assessed in years. We will obtain a follow-up echocardiogram to help guide further medication adjustments, anticipating otherwise an overall conservative approach. We are also requesting his most recent blood pressure checks from his assisted living facility to better facilitate medication adjustments. His blood pressure today was mildly low.  2. Leg edema, continue compression stockings and Lasix. Diuretics may need to be pushed up further although somewhat limited by blood pressure today.  3. Chronic atrial fibrillation, heart rate  adequately controlled today.  Current medicines were reviewed with the patient today.   Orders Placed This Encounter  Procedures  . Echocardiogram    Disposition: FU with me in 1 month.   Signed, Jonelle SidleSamuel G. McDowell, MD, [facility] 12/03/2015 10:57 AM    [facility] [address], [place], [place] [address] Phone: [phone]; Fax: [phone]

## 2015-12-03 NOTE — Patient Instructions (Signed)
Your physician recommends that you schedule a follow-up appointment in: 1 month with Dr Diona BrownerMcDowell   Your physician recommends that you continue on your current medications as directed. Please refer to the Current Medication list given to you today.     Your physician has requested that you have an echocardiogram. Echocardiography is a painless test that uses sound waves to create images of your heart. It provides your doctor with information about the size and shape of your heart and how well your heart's chambers and valves are working. This procedure takes approximately one hour. There are no restrictions for this procedure.     PLEASE FAX USE A LIST OF HIS BLOOD PRESSURES  [phone]   ATTN: [place]     Thank you for choosing [facility] !

## 2015-12-10 ENCOUNTER — Encounter (INDEPENDENT_AMBULATORY_CARE_PROVIDER_SITE_OTHER): Payer: Self-pay | Admitting: Internal Medicine

## 2015-12-10 ENCOUNTER — Ambulatory Visit (HOSPITAL_COMMUNITY)
Admission: RE | Admit: 2015-12-10 | Discharge: 2015-12-10 | Disposition: A | Discharge status: Home / Self Care | Payer: Medicare Other | Source: Ambulatory Visit | Attending: Cardiology | Admitting: Cardiology

## 2015-12-10 DIAGNOSIS — I2510 Atherosclerotic heart disease of native coronary artery without angina pectoris: Secondary | ICD-10-CM

## 2015-12-10 DIAGNOSIS — E785 Hyperlipidemia, unspecified: Secondary | ICD-10-CM | POA: Insufficient documentation

## 2015-12-10 DIAGNOSIS — R0602 Shortness of breath: Secondary | ICD-10-CM | POA: Insufficient documentation

## 2016-01-12 ENCOUNTER — Encounter: Payer: Self-pay | Admitting: Cardiology

## 2016-01-12 ENCOUNTER — Ambulatory Visit (INDEPENDENT_AMBULATORY_CARE_PROVIDER_SITE_OTHER): Payer: Medicare Other | Admitting: Cardiology

## 2016-01-12 VITALS — BP 100/58 | HR 64 | Ht 67.0 in | Wt 183.4 lb

## 2016-01-12 DIAGNOSIS — I482 Chronic atrial fibrillation, unspecified: Secondary | ICD-10-CM

## 2016-01-12 DIAGNOSIS — I10 Essential (primary) hypertension: Secondary | ICD-10-CM

## 2016-01-12 DIAGNOSIS — I5032 Chronic diastolic (congestive) heart failure: Secondary | ICD-10-CM

## 2016-01-12 NOTE — Patient Instructions (Signed)
Medication Instructions:  Your physician recommends that you continue on your current medications as directed. Please refer to the Current Medication list given to you today.   Labwork: NONE  Testing/Procedures: NONE  Follow-Up: Your physician recommends that you schedule a follow-up appointment in: 4 MONTHS    Any Other Special Instructions Will Be Listed Below (If Applicable).     If you need a refill on your cardiac medications before your next appointment, please call your pharmacy.   

## 2016-01-12 NOTE — Progress Notes (Signed)
Cardiology Office Note  Date: 01/12/2016   ID: Abas, Leicht [birth year], MRN [id number]  PCP: Dwana Melena, MD  Primary Cardiologist: Nona Dell, MD   Chief Complaint  Patient presents with  . Coronary Artery Disease  . Atrial Fibrillation    History of Present Illness: [name] is a 82 y.o. male last seen in January. He presents with an assistant today for a follow-up visit, continues to reside in an assisted living facility. He states that he feels like he has been doing fairly well. His leg edema is much better controlled. He does not have any worsening dizziness or shortness of breath.  We discussed his recent follow-up echocardiogram which is outlined below. Plan is to continue medical therapy. At present he continues on Lasix 20 mg daily, Diovan, pindolol, and aspirin. His weight is down 10 pounds from the last visit.  Past Medical History  Diagnosis Date  . Coronary atherosclerosis of native coronary artery     Multivessel status post CABG  . Mixed hyperlipidemia   . Type 2 diabetes mellitus (HCC)   . Atrial fibrillation (HCC)   . Essential hypertension, benign     Current Outpatient Prescriptions  Medication Sig Dispense Refill  . aspirin EC 81 MG tablet Take 81 mg by mouth every evening.     . beta carotene w/minerals (OCUVITE) tablet Take 1 tablet by mouth every evening.     . dutasteride (AVODART) 0.5 MG capsule Take 0.5 mg by mouth every evening.     . furosemide (LASIX) 20 MG tablet Take 1 tablet (20 mg total) by mouth daily. 10 tablet 0  . gabapentin (NEURONTIN) 100 MG capsule Take 100 mg by mouth at bedtime.    Marland Kitchen ibuprofen (ADVIL,MOTRIN) 200 MG tablet Take 400 mg by mouth every 6 (six) hours as needed for moderate pain.    . metFORMIN (GLUCOPHAGE) 500 MG tablet Take 1 tablet by mouth daily.    . mirabegron ER (MYRBETRIQ) 25 MG TB24 tablet Take 50 mg by mouth every evening. *Adminstered in the evening*    . Multiple Vitamin (MULTIVITAMIN WITH  MINERALS) TABS tablet Take 1 tablet by mouth every morning.    Marland Kitchen omeprazole (PRILOSEC) 40 MG capsule Take 40 mg by mouth daily.    . pindolol (VISKEN) 5 MG tablet Take 2.5 mg by mouth daily.     . sitaGLIPtin (JANUVIA) 100 MG tablet Take 100 mg by mouth every evening.     . valsartan (DIOVAN) 160 MG tablet Take 160 mg by mouth every evening.      No current facility-administered medications for this visit.   Allergies:  Acetaminophen-codeine; Advil; and Penicillins   Social History: The patient  reports that he quit smoking about 52 years ago. He started smoking about 71 years ago. He has never used smokeless tobacco. He reports that he does not drink alcohol or use illicit drugs.   ROS:  Please see the history of present illness. Otherwise, complete review of systems is positive for diminished hearing, arthritic pains.  All other systems are reviewed and negative.   Physical Exam: VS:  BP 100/58 mmHg  Pulse 64  Ht  (1.702 m)  Wt 183 lb 6.4 oz (83.19 kg)  BMI 28.72 kg/m2  SpO2 97%, BMI Body mass index is 28.72 kg/(m^2).  Wt Readings from Last 3 Encounters:  01/12/16 183 lb 6.4 oz (83.19 kg)  12/03/15 193 lb (87.544 kg)  11/21/15 196 lb (88.905 kg)  General: Elderly male, appears comfortable at rest. Has a rolling walker. HEENT: Conjunctiva and lids normal, oropharynx clear. Neck: Supple, elevated JVP, no carotid bruits, no thyromegaly. Lungs: Clear to auscultation, nonlabored breathing at rest. Cardiac: Irregularly irregular, no S3, soft systolic murmur, no pericardial rub. Abdomen: Soft, nontender, bowel sounds present, no guarding or rebound. Extremities: Compression hose in place on legs bilaterally with no pitting edema..  ECG: ECG is not ordered today.  Recent Labwork: 11/21/2015: B Natriuretic Peptide 433.0*; BUN 23*; Creatinine, Ser 1.46*; Hemoglobin 11.7*; Platelets 92*; Potassium 4.3; Sodium 132*   Other Studies Reviewed Today:  Chest x-ray  11/21/2015: FINDINGS: Single-view of the chest demonstrates emphysematous changes of the lungs with bibasilar atelectasis/ scarring. No focal consolidation, pleural effusion, or pneumothorax. Stable cardiomegaly. Median sternotomy wires and CABG vascular clips noted. The osseous structures are grossly unremarkable.  IMPRESSION: No active disease.  Echocardiogram 12/10/2015: Study Conclusions  - Left ventricle: The cavity size was normal. Wall thickness was increased in a pattern of mild LVH. Systolic function was normal. The estimated ejection fraction was in the range of 60% to 65%. Wall motion was normal; there were no regional wall motion abnormalities. The study was not technically sufficient to allow evaluation of LV diastolic dysfunction due to atrial fibrillation. Supportive findings of severe biatrial enlargement and systolic blunting of pulmonary venous flow suggest significant diastolic dysfunction. - Aortic valve: Mildly calcified annulus. Trileaflet; normal thickness leaflets. Valve area (VTI): 2.64 cm^2. Valve area (Vmax): 2.61 cm^2. - Mitral valve: Moderately calcified annulus. Normal thickness leaflets . There was moderate regurgitation. The MR vena contacta is 0.4 cm. - Left atrium: The atrium was severely dilated. - Right ventricle: The cavity size was moderately dilated. The RV shares the apex with the LV. Systolic function was moderately reduced. - Right atrium: The atrium was severely dilated. - Atrial septum: No defect or patent foramen ovale was identified. - Tricuspid valve: There was moderate regurgitation. - Pulmonary arteries: PASP is at least 51 mmHg (moderately elevated). The IVC is poorly visualized, unable to estimate RA pressure. - Technically adequate study.  Assessment and Plan:  1. Chronic atrial fibrillation, no symptomatic palpitations. He continues on pindolol and aspirin, not anticoagulated with poor  balance and history of falls.  2. Significantly improved volume status with diastolic dysfunction and moderate pulmonary hypertension. He is tolerating his present regimen including low dose Lasix with decrease in weight and significantly improved leg edema. Continue use of compression stockings.  3. Essential hypertension, no changes made to present regimen.  Current medicines were reviewed with the patient today.  Disposition: FU with me in 4 months.   Signed, Jonelle Sidle, MD, [facility] 01/12/2016 11:22 AM    [facility] at [facility] [address] [place], [place] [address] Phone: [phone]; Fax: [phone]

## 2016-02-15 ENCOUNTER — Ambulatory Visit (INDEPENDENT_AMBULATORY_CARE_PROVIDER_SITE_OTHER): Payer: Medicare Other | Admitting: Internal Medicine

## 2016-03-22 ENCOUNTER — Inpatient Hospital Stay (HOSPITAL_COMMUNITY)
Admission: EM | Admit: 2016-03-22 | Discharge: 2016-03-24 | DRG: 690 | Disposition: A | Discharge status: Skilled Nursing Facility | Payer: Medicare Other | Attending: Family Medicine | Admitting: Family Medicine

## 2016-03-22 ENCOUNTER — Encounter (HOSPITAL_COMMUNITY)
Admission: EM | Disposition: A | Discharge status: Skilled Nursing Facility | Payer: Self-pay | Source: Home / Self Care | Attending: Internal Medicine

## 2016-03-22 ENCOUNTER — Encounter: Payer: Self-pay | Admitting: Cardiology

## 2016-03-22 ENCOUNTER — Inpatient Hospital Stay (HOSPITAL_COMMUNITY): Payer: Medicare Other

## 2016-03-22 ENCOUNTER — Inpatient Hospital Stay (HOSPITAL_COMMUNITY): Payer: Medicare Other | Admitting: Anesthesiology

## 2016-03-22 ENCOUNTER — Encounter (HOSPITAL_COMMUNITY): Payer: Self-pay | Admitting: Emergency Medicine

## 2016-03-22 ENCOUNTER — Emergency Department (HOSPITAL_COMMUNITY): Payer: Medicare Other

## 2016-03-22 DIAGNOSIS — Z951 Presence of aortocoronary bypass graft: Secondary | ICD-10-CM | POA: Diagnosis not present

## 2016-03-22 DIAGNOSIS — N201 Calculus of ureter: Secondary | ICD-10-CM

## 2016-03-22 DIAGNOSIS — N10 Acute tubulo-interstitial nephritis: Secondary | ICD-10-CM | POA: Diagnosis present

## 2016-03-22 DIAGNOSIS — E875 Hyperkalemia: Secondary | ICD-10-CM | POA: Diagnosis present

## 2016-03-22 DIAGNOSIS — Z87891 Personal history of nicotine dependence: Secondary | ICD-10-CM

## 2016-03-22 DIAGNOSIS — E1122 Type 2 diabetes mellitus with diabetic chronic kidney disease: Secondary | ICD-10-CM | POA: Diagnosis present

## 2016-03-22 DIAGNOSIS — N200 Calculus of kidney: Secondary | ICD-10-CM

## 2016-03-22 DIAGNOSIS — A419 Sepsis, unspecified organism: Secondary | ICD-10-CM | POA: Diagnosis not present

## 2016-03-22 DIAGNOSIS — N183 Chronic kidney disease, stage 3 unspecified: Secondary | ICD-10-CM | POA: Diagnosis present

## 2016-03-22 DIAGNOSIS — N179 Acute kidney failure, unspecified: Secondary | ICD-10-CM | POA: Diagnosis present

## 2016-03-22 DIAGNOSIS — R1031 Right lower quadrant pain: Secondary | ICD-10-CM | POA: Diagnosis present

## 2016-03-22 DIAGNOSIS — Z66 Do not resuscitate: Secondary | ICD-10-CM | POA: Diagnosis present

## 2016-03-22 DIAGNOSIS — Z9181 History of falling: Secondary | ICD-10-CM

## 2016-03-22 DIAGNOSIS — I5032 Chronic diastolic (congestive) heart failure: Secondary | ICD-10-CM | POA: Diagnosis present

## 2016-03-22 DIAGNOSIS — I2510 Atherosclerotic heart disease of native coronary artery without angina pectoris: Secondary | ICD-10-CM | POA: Diagnosis present

## 2016-03-22 DIAGNOSIS — N132 Hydronephrosis with renal and ureteral calculous obstruction: Secondary | ICD-10-CM | POA: Diagnosis not present

## 2016-03-22 DIAGNOSIS — H9190 Unspecified hearing loss, unspecified ear: Secondary | ICD-10-CM | POA: Diagnosis present

## 2016-03-22 DIAGNOSIS — N390 Urinary tract infection, site not specified: Secondary | ICD-10-CM

## 2016-03-22 DIAGNOSIS — K219 Gastro-esophageal reflux disease without esophagitis: Secondary | ICD-10-CM | POA: Diagnosis present

## 2016-03-22 DIAGNOSIS — I130 Hypertensive heart and chronic kidney disease with heart failure and stage 1 through stage 4 chronic kidney disease, or unspecified chronic kidney disease: Secondary | ICD-10-CM | POA: Diagnosis present

## 2016-03-22 DIAGNOSIS — E1165 Type 2 diabetes mellitus with hyperglycemia: Secondary | ICD-10-CM | POA: Diagnosis present

## 2016-03-22 DIAGNOSIS — D72829 Elevated white blood cell count, unspecified: Secondary | ICD-10-CM | POA: Diagnosis not present

## 2016-03-22 DIAGNOSIS — Z833 Family history of diabetes mellitus: Secondary | ICD-10-CM

## 2016-03-22 DIAGNOSIS — E782 Mixed hyperlipidemia: Secondary | ICD-10-CM | POA: Diagnosis present

## 2016-03-22 DIAGNOSIS — N131 Hydronephrosis with ureteral stricture, not elsewhere classified: Secondary | ICD-10-CM

## 2016-03-22 DIAGNOSIS — N136 Pyonephrosis: Principal | ICD-10-CM | POA: Diagnosis present

## 2016-03-22 DIAGNOSIS — N1330 Unspecified hydronephrosis: Secondary | ICD-10-CM | POA: Diagnosis present

## 2016-03-22 DIAGNOSIS — Z89421 Acquired absence of other right toe(s): Secondary | ICD-10-CM

## 2016-03-22 DIAGNOSIS — R8281 Pyuria: Secondary | ICD-10-CM

## 2016-03-22 DIAGNOSIS — E118 Type 2 diabetes mellitus with unspecified complications: Secondary | ICD-10-CM | POA: Diagnosis not present

## 2016-03-22 DIAGNOSIS — I482 Chronic atrial fibrillation, unspecified: Secondary | ICD-10-CM | POA: Diagnosis present

## 2016-03-22 DIAGNOSIS — R310 Gross hematuria: Secondary | ICD-10-CM | POA: Diagnosis present

## 2016-03-22 HISTORY — DX: Unspecified hydronephrosis: N13.30

## 2016-03-22 HISTORY — DX: Sleep apnea, unspecified: G47.30

## 2016-03-22 HISTORY — PX: CYSTOSCOPY W/ URETERAL STENT PLACEMENT: SHX1429

## 2016-03-22 LAB — URINE MICROSCOPIC-ADD ON: Squamous Epithelial / LPF: NONE SEEN

## 2016-03-22 LAB — URINALYSIS, ROUTINE W REFLEX MICROSCOPIC
BILIRUBIN URINE: NEGATIVE
Glucose, UA: NEGATIVE mg/dL
KETONES UR: NEGATIVE mg/dL
NITRITE: NEGATIVE
Protein, ur: 30 mg/dL — AB
Specific Gravity, Urine: 1.01 (ref 1.005–1.030)
pH: 5.5 (ref 5.0–8.0)

## 2016-03-22 LAB — COMPREHENSIVE METABOLIC PANEL
ALK PHOS: 77 U/L (ref 38–126)
ALT: 15 U/L — ABNORMAL LOW (ref 17–63)
ANION GAP: 13 (ref 5–15)
AST: 26 U/L (ref 15–41)
Albumin: 4 g/dL (ref 3.5–5.0)
BILIRUBIN TOTAL: 0.7 mg/dL (ref 0.3–1.2)
BUN: 38 mg/dL — ABNORMAL HIGH (ref 6–20)
CALCIUM: 9 mg/dL (ref 8.9–10.3)
CO2: 20 mmol/L — ABNORMAL LOW (ref 22–32)
Chloride: 104 mmol/L (ref 101–111)
Creatinine, Ser: 2.14 mg/dL — ABNORMAL HIGH (ref 0.61–1.24)
GFR calc non Af Amer: 24 mL/min — ABNORMAL LOW (ref >60)
GFR, EST AFRICAN AMERICAN: 28 mL/min — AB (ref >60)
Glucose, Bld: 207 mg/dL — ABNORMAL HIGH (ref 65–99)
Potassium: 5.2 mmol/L — ABNORMAL HIGH (ref 3.5–5.1)
SODIUM: 137 mmol/L (ref 135–145)
TOTAL PROTEIN: 7.9 g/dL (ref 6.5–8.1)

## 2016-03-22 LAB — LIPASE, BLOOD: LIPASE: 26 U/L (ref 11–51)

## 2016-03-22 LAB — CBC WITH DIFFERENTIAL/PLATELET
Basophils Absolute: 0.1 10*3/uL (ref 0.0–0.1)
Basophils Relative: 0 %
EOS ABS: 0.2 10*3/uL (ref 0.0–0.7)
Eosinophils Relative: 1 %
HEMATOCRIT: 35 % — AB (ref 39.0–52.0)
HEMOGLOBIN: 11.8 g/dL — AB (ref 13.0–17.0)
LYMPHS ABS: 2.1 10*3/uL (ref 0.7–4.0)
Lymphocytes Relative: 10 %
MCH: 31.5 pg (ref 26.0–34.0)
MCHC: 33.7 g/dL (ref 30.0–36.0)
MCV: 93.3 fL (ref 78.0–100.0)
MONO ABS: 1.4 10*3/uL — AB (ref 0.1–1.0)
MONOS PCT: 7 %
NEUTROS PCT: 82 %
Neutro Abs: 16.1 10*3/uL — ABNORMAL HIGH (ref 1.7–7.7)
Platelets: 160 10*3/uL (ref 150–400)
RBC: 3.75 MIL/uL — ABNORMAL LOW (ref 4.22–5.81)
RDW: 15 % (ref 11.5–15.5)
WBC: 19.7 10*3/uL — ABNORMAL HIGH (ref 4.0–10.5)

## 2016-03-22 LAB — TROPONIN I: TROPONIN I: 0.05 ng/mL — AB (ref <0.031)

## 2016-03-22 LAB — BRAIN NATRIURETIC PEPTIDE: B NATRIURETIC PEPTIDE 5: 588 pg/mL — AB (ref 0.0–100.0)

## 2016-03-22 LAB — SURGICAL PCR SCREEN
MRSA, PCR: NEGATIVE
STAPHYLOCOCCUS AUREUS: POSITIVE — AB

## 2016-03-22 LAB — GLUCOSE, CAPILLARY
GLUCOSE-CAPILLARY: 151 mg/dL — AB (ref 65–99)
GLUCOSE-CAPILLARY: 148 mg/dL — AB (ref 65–99)
Glucose-Capillary: 188 mg/dL — ABNORMAL HIGH (ref 65–99)

## 2016-03-22 SURGERY — CYSTOSCOPY, WITH RETROGRADE PYELOGRAM AND URETERAL STENT INSERTION
Anesthesia: Spinal | Laterality: Right

## 2016-03-22 MED ORDER — LIDOCAINE HCL 2 % EX GEL
CUTANEOUS | Status: AC
Start: 2016-03-22 — End: 2016-03-22
  Filled 2016-03-22: qty 10

## 2016-03-22 MED ORDER — MIDAZOLAM HCL 2 MG/2ML IJ SOLN
INTRAMUSCULAR | Status: AC
  Filled 2016-03-22: qty 2

## 2016-03-22 MED ORDER — ONDANSETRON HCL 4 MG/2ML IJ SOLN: 4.0000 mg | Freq: Four times a day (QID) | INTRAMUSCULAR | Status: DC | PRN

## 2016-03-22 MED ORDER — ONDANSETRON HCL 4 MG/2ML IJ SOLN
4.0000 mg | Freq: Once | INTRAMUSCULAR | Status: AC
  Administered 2016-03-22: 4 mg via INTRAVENOUS
  Filled 2016-03-22: qty 2

## 2016-03-22 MED ORDER — DEXTROSE 5 % IV SOLN
1.0000 g | Freq: Once | INTRAVENOUS | Status: AC
  Administered 2016-03-22: 1 g via INTRAVENOUS
  Filled 2016-03-22: qty 10

## 2016-03-22 MED ORDER — SODIUM CHLORIDE 0.9 % IV SOLN
INTRAVENOUS | Status: DC
  Administered 2016-03-22: 100 mL/h via INTRAVENOUS

## 2016-03-22 MED ORDER — CHLORHEXIDINE GLUCONATE CLOTH 2 % EX PADS
6.0000 | MEDICATED_PAD | Freq: Every day | CUTANEOUS | Status: DC
  Administered 2016-03-23 – 2016-03-24 (×2): 6 via TOPICAL

## 2016-03-22 MED ORDER — ASPIRIN EC 81 MG PO TBEC
81.0000 mg | DELAYED_RELEASE_TABLET | Freq: Every evening | ORAL | Status: DC
  Administered 2016-03-23: 81 mg via ORAL
  Filled 2016-03-22: qty 1

## 2016-03-22 MED ORDER — ACETAMINOPHEN 650 MG RE SUPP: 650.0000 mg | Freq: Four times a day (QID) | RECTAL | Status: DC | PRN

## 2016-03-22 MED ORDER — HYDROCODONE-ACETAMINOPHEN 5-325 MG PO TABS: 1.0000 | ORAL_TABLET | ORAL | Status: DC | PRN

## 2016-03-22 MED ORDER — DUTASTERIDE 0.5 MG PO CAPS
0.5000 mg | ORAL_CAPSULE | Freq: Every evening | ORAL | Status: DC
  Administered 2016-03-23: 0.5 mg via ORAL
  Filled 2016-03-22 (×3): qty 1

## 2016-03-22 MED ORDER — FENTANYL CITRATE (PF) 100 MCG/2ML IJ SOLN
INTRAMUSCULAR | Status: AC
  Filled 2016-03-22: qty 2

## 2016-03-22 MED ORDER — GABAPENTIN 100 MG PO CAPS
100.0000 mg | ORAL_CAPSULE | Freq: Every day | ORAL | Status: DC
  Administered 2016-03-22 – 2016-03-23 (×2): 100 mg via ORAL
  Filled 2016-03-22 (×2): qty 1

## 2016-03-22 MED ORDER — SODIUM CHLORIDE 0.9 % IR SOLN
Status: DC | PRN
  Administered 2016-03-22: 3000 mL

## 2016-03-22 MED ORDER — PANTOPRAZOLE SODIUM 40 MG PO TBEC
40.0000 mg | DELAYED_RELEASE_TABLET | Freq: Every day | ORAL | Status: DC
  Administered 2016-03-23 – 2016-03-24 (×2): 40 mg via ORAL
  Filled 2016-03-22 (×2): qty 1

## 2016-03-22 MED ORDER — BUPIVACAINE IN DEXTROSE 0.75-8.25 % IT SOLN
INTRATHECAL | Status: AC
  Filled 2016-03-22: qty 2

## 2016-03-22 MED ORDER — FUROSEMIDE 20 MG PO TABS
20.0000 mg | ORAL_TABLET | Freq: Every day | ORAL | Status: DC
  Administered 2016-03-23 – 2016-03-24 (×2): 20 mg via ORAL
  Filled 2016-03-22 (×2): qty 1

## 2016-03-22 MED ORDER — SODIUM CHLORIDE 0.9 % IV SOLN
INTRAVENOUS | Status: DC | PRN
  Administered 2016-03-22: 3 mL

## 2016-03-22 MED ORDER — SODIUM CHLORIDE 0.9 % IV SOLN
INTRAVENOUS | Status: DC
  Administered 2016-03-22 – 2016-03-23 (×2): via INTRAVENOUS

## 2016-03-22 MED ORDER — PINDOLOL 5 MG PO TABS
2.5000 mg | ORAL_TABLET | Freq: Every day | ORAL | Status: DC
  Administered 2016-03-23 – 2016-03-24 (×2): 2.5 mg via ORAL
  Filled 2016-03-22 (×4): qty 1

## 2016-03-22 MED ORDER — PHENYLEPHRINE HCL 10 MG/ML IJ SOLN
INTRAMUSCULAR | Status: DC | PRN
  Administered 2016-03-22: 40 ug via INTRAVENOUS

## 2016-03-22 MED ORDER — EPHEDRINE SULFATE 50 MG/ML IJ SOLN
INTRAMUSCULAR | Status: AC
Start: 2016-03-22 — End: 2016-03-22
  Filled 2016-03-22: qty 1

## 2016-03-22 MED ORDER — PHENYLEPHRINE 40 MCG/ML (10ML) SYRINGE FOR IV PUSH (FOR BLOOD PRESSURE SUPPORT)
PREFILLED_SYRINGE | INTRAVENOUS | Status: AC
  Filled 2016-03-22: qty 10

## 2016-03-22 MED ORDER — ONDANSETRON HCL 4 MG/2ML IJ SOLN: 4.0000 mg | Freq: Once | INTRAMUSCULAR | Status: DC | PRN

## 2016-03-22 MED ORDER — PROPOFOL 10 MG/ML IV BOLUS
INTRAVENOUS | Status: AC
  Filled 2016-03-22: qty 20

## 2016-03-22 MED ORDER — MIRABEGRON ER 25 MG PO TB24
50.0000 mg | ORAL_TABLET | Freq: Every evening | ORAL | Status: DC
  Administered 2016-03-23: 50 mg via ORAL
  Filled 2016-03-22: qty 2

## 2016-03-22 MED ORDER — SUCCINYLCHOLINE CHLORIDE 20 MG/ML IJ SOLN
INTRAMUSCULAR | Status: AC
Start: 2016-03-22 — End: 2016-03-22
  Filled 2016-03-22: qty 1

## 2016-03-22 MED ORDER — ROCURONIUM BROMIDE 50 MG/5ML IV SOLN
INTRAVENOUS | Status: AC
  Filled 2016-03-22: qty 1

## 2016-03-22 MED ORDER — FENTANYL CITRATE (PF) 100 MCG/2ML IJ SOLN
50.0000 ug | Freq: Once | INTRAMUSCULAR | Status: AC
  Administered 2016-03-22: 50 ug via INTRAVENOUS
  Filled 2016-03-22: qty 2

## 2016-03-22 MED ORDER — SODIUM CHLORIDE 0.9 % IJ SOLN
INTRAMUSCULAR | Status: AC
  Filled 2016-03-22: qty 20

## 2016-03-22 MED ORDER — INSULIN ASPART 100 UNIT/ML ~~LOC~~ SOLN: 0.0000 [IU] | Freq: Every day | SUBCUTANEOUS | Status: DC

## 2016-03-22 MED ORDER — MIDAZOLAM HCL 2 MG/2ML IJ SOLN
1.0000 mg | INTRAMUSCULAR | Status: DC | PRN
  Administered 2016-03-22: 1 mg via INTRAVENOUS

## 2016-03-22 MED ORDER — FENTANYL CITRATE (PF) 100 MCG/2ML IJ SOLN
25.0000 ug | Freq: Once | INTRAMUSCULAR | Status: AC
  Administered 2016-03-22: 25 ug via INTRAVENOUS
  Filled 2016-03-22: qty 2

## 2016-03-22 MED ORDER — SODIUM CHLORIDE 0.9 % IJ SOLN
INTRAMUSCULAR | Status: AC
  Filled 2016-03-22: qty 10

## 2016-03-22 MED ORDER — SODIUM CHLORIDE 0.9 % IV SOLN: INTRAVENOUS | Status: DC

## 2016-03-22 MED ORDER — ACETAMINOPHEN 325 MG PO TABS
650.0000 mg | ORAL_TABLET | Freq: Four times a day (QID) | ORAL | Status: DC | PRN
Start: 2016-03-22 — End: 2016-03-24

## 2016-03-22 MED ORDER — BUPIVACAINE IN DEXTROSE 0.75-8.25 % IT SOLN
INTRATHECAL | Status: DC | PRN
  Administered 2016-03-22: 1 mL via INTRATHECAL

## 2016-03-22 MED ORDER — PROPOFOL 500 MG/50ML IV EMUL
INTRAVENOUS | Status: DC | PRN
  Administered 2016-03-22: 10 ug/kg/min via INTRAVENOUS

## 2016-03-22 MED ORDER — ONDANSETRON HCL 4 MG PO TABS: 4.0000 mg | ORAL_TABLET | Freq: Four times a day (QID) | ORAL | Status: DC | PRN

## 2016-03-22 MED ORDER — DEXTROSE 5 % IV SOLN
1.0000 g | INTRAVENOUS | Status: DC
  Administered 2016-03-23 – 2016-03-24 (×2): 1 g via INTRAVENOUS
  Filled 2016-03-22 (×3): qty 10

## 2016-03-22 MED ORDER — INSULIN ASPART 100 UNIT/ML ~~LOC~~ SOLN
0.0000 [IU] | Freq: Three times a day (TID) | SUBCUTANEOUS | Status: DC
  Administered 2016-03-22: 3 [IU] via SUBCUTANEOUS
  Administered 2016-03-23 (×3): 2 [IU] via SUBCUTANEOUS
  Administered 2016-03-24: 3 [IU] via SUBCUTANEOUS
  Administered 2016-03-24: 2 [IU] via SUBCUTANEOUS

## 2016-03-22 MED ORDER — FENTANYL CITRATE (PF) 100 MCG/2ML IJ SOLN: 25.0000 ug | INTRAMUSCULAR | Status: DC | PRN

## 2016-03-22 MED ORDER — LACTATED RINGERS IV SOLN
INTRAVENOUS | Status: DC
  Administered 2016-03-22: 15:00:00 via INTRAVENOUS

## 2016-03-22 MED ORDER — SODIUM CHLORIDE 0.9% FLUSH
3.0000 mL | Freq: Two times a day (BID) | INTRAVENOUS | Status: DC
  Administered 2016-03-23 – 2016-03-24 (×3): 3 mL via INTRAVENOUS

## 2016-03-22 SURGICAL SUPPLY — 23 items
BAG DRAIN URO TABLE W/ADPT NS (DRAPE) ×3 IMPLANT
BAG DRN 8 ADPR NS SKTRN CSTL (DRAPE) ×1
BAG HAMPER (MISCELLANEOUS) ×3 IMPLANT
CATH INTERMIT  6FR 70CM (CATHETERS) ×3 IMPLANT
CLOTH BEACON ORANGE TIMEOUT ST (SAFETY) ×3 IMPLANT
DECANTER SPIKE VIAL GLASS SM (MISCELLANEOUS) ×3 IMPLANT
GLOVE BIOGEL M 8.0 STRL (GLOVE) ×3 IMPLANT
GLOVE BIOGEL PI IND STRL 7.0 (GLOVE) IMPLANT
GLOVE BIOGEL PI INDICATOR 7.0 (GLOVE) ×4
GLOVE ECLIPSE 6.5 STRL STRAW (GLOVE) ×2 IMPLANT
GLOVE EXAM NITRILE MD LF STRL (GLOVE) ×2 IMPLANT
GOWN STRL REIN XL XLG (GOWN DISPOSABLE) ×3 IMPLANT
GOWN STRL REUS W/TWL LRG LVL3 (GOWN DISPOSABLE) ×2 IMPLANT
GUIDEWIRE STR DUAL SENSOR (WIRE) ×3 IMPLANT
IV NS IRRIG 3000ML ARTHROMATIC (IV SOLUTION) ×3 IMPLANT
KIT ROOM TURNOVER AP CYSTO (KITS) ×3 IMPLANT
MANIFOLD NEPTUNE II (INSTRUMENTS) ×3 IMPLANT
PACK CYSTO (CUSTOM PROCEDURE TRAY) ×3 IMPLANT
PAD ARMBOARD 7.5X6 YLW CONV (MISCELLANEOUS) ×3 IMPLANT
STENT URET 6FRX24 CONTOUR (STENTS) ×2 IMPLANT
TOWEL OR 17X26 4PK STRL BLUE (TOWEL DISPOSABLE) ×3 IMPLANT
WATER STERILE IRR 1000ML POUR (IV SOLUTION) ×3 IMPLANT
WATER STERILE IRR 3000ML UROMA (IV SOLUTION) ×2 IMPLANT

## 2016-03-22 NOTE — Progress Notes (Signed)
Dr Benancio DeedsNewsome at bedside to check pt. Cleared for d/c to room.

## 2016-03-22 NOTE — Consult Note (Signed)
Urology Consult  Consulting ZO:XWRUEAVD:Kristin Ward, DO  CC: Kidney stone  HPI: This is a 82 year old male who is in independent living in a nearby retirement community, significantly deaf, who is being admitted to the hospital today for management of a right ureteral stone. The patient presented with recent onset of right sided abdominal pain. CT of the abdomen without contrast here in the emergency room revealed the patient have 6-7 mm right midureteral stone with smaller right renal stone and associated right hydronephrosis. The patient also has infected urine and leukocytosis. He is not febrile. Associated comorbidities including history of coronary artery disease, diabetes mellitus and hypertension as well as atrial fibrillation.  PMH: Past Medical History  Diagnosis Date  . Coronary atherosclerosis of native coronary artery     Multivessel status post CABG  . Mixed hyperlipidemia   . Type 2 diabetes mellitus (HCC)   . Atrial fibrillation (HCC)   . Essential hypertension, benign     PSH: Past Surgical History  Procedure Laterality Date  . Tonsillectomy    . Cholecystectomy    . Knee arthroscopy    . Hernia repair    . Coronary artery bypass graft  1992    LIMA to LAD and diagonal, SVG to RCA, SVG to OM  . Back surgery    . Esophagogastroduodenoscopy (egd) with esophageal dilation N/A 09/12/2013    Procedure: ESOPHAGOGASTRODUODENOSCOPY (EGD) WITH ESOPHAGEAL DILATION;  Surgeon: Malissa HippoNajeeb U Rehman, MD;  Location: [facility];  Service: Endoscopy;  Laterality: N/A;    Allergies: Allergies  Allergen Reactions  . Acetaminophen-Codeine     Unknown reaction-not listed on current MAR  . Advil [Ibuprofen] Other (See Comments)    Unknown reaction  . Penicillins     Unknown reaction-not listed on current MAR    Medications:  (Not in a hospital admission)   Social History: Social History   Social History  . Marital Status: Widowed    Spouse Name: N/A  . Number of Children: N/A   . Years of Education: college   Occupational History  . Retired    Social History Main Topics  . Smoking status: Former Smoker    Start date: 11/28/1944    Quit date: 11/29/1963  . Smokeless tobacco: Never Used  . Alcohol Use: No  . Drug Use: No  . Sexual Activity: Not Currently   Other Topics Concern  . Not on file   Social History Narrative    Family History: Family History  Problem Relation Age of Onset  . Diabetes      Review of Systems: I am unable to get a proper review of systems due to significant deafness on the patient's part. He does say that he had abdominal pain recently.  Physical Exam: @VITALS2 @ General: No acute distress.  Awake. Head:  Normocephalic.  Atraumatic. Significantly hard of hearing, even with lateral hearing aids present ENT:  EOMI.  Mucous membranes moist Neck:  Supple.  No lymphadenopathy. CV:  S1 present. S2 present. Chest:            Normal inspiratory and expiratory effort Abdomen:  Soft, protuberant, minimal right lower quadrant and right upper quadrant as well as right CVA tenderness. No rebound or guarding  Skin:  Normal turgor.  No visible rash. Extremity: No gross deformity of bilateral upper extremities.  No gross deformity of bilateral lower extremities. Status post amputation of right second toe  Neurologic: patient is somnolent but arousable.     Studies:  Recent  Labs     03/22/16  0540  HGB  11.8*  WBC  19.7*  PLT  160    Recent Labs     03/22/16  0540  NA  137  K  5.2*  CL  104  CO2  20*  BUN  38*  CREATININE  2.14*  CALCIUM  9.0  GFRNONAA  24*  GFRAA  28*     No results for input(s): INR, APTT in the last 72 hours.  Invalid input(s): PT   Invalid input(s): ABG   I reviewed CT images as well as laboratories   Assessment 1. Right midureteral stone with associated hydronephrosis, presenting with pain 2. Urinary tract infection with leukocytosis  Plan:  1. Patient will be admitted by medicine  service, with attention to medical issues  2. I think he would be best to proceed today with cystoscopy and stent placement for proper drainage of his infected right renal unit, with eventual management of the stone at a later date, following resolution of infection-ureteroscopy/holmium laser lithotripsy versus extracorporeal shockwave lithotripsy  3. I will discuss procedure with operating room/anesthetic staff, and more than likely proceed late this afternoon       Pager:[phone]

## 2016-03-22 NOTE — Transfer of Care (Signed)
Immediate Anesthesia Transfer of Care Note  Patient: [name]  Procedure(s) Performed: Procedure(s): CYSTOSCOPY WITH RIGHT RETROGRADE PYELOGRAM, RIGHT URETERAL STENT PLACEMENT (Right)  Patient Location: PACU  Anesthesia Type:Spinal  Level of Consciousness: awake  Airway & Oxygen Therapy: Patient Spontanous Breathing and Patient connected to nasal cannula oxygen  Post-op Assessment: Report given to RN  Post vital signs: Reviewed and stable  Last Vitals:  Filed Vitals:   03/22/16 1610 03/22/16 1615  BP: 97/59 92/47  Pulse:    Temp:    Resp: 16 15    Complications: No apparent anesthesia complications

## 2016-03-22 NOTE — ED Provider Notes (Signed)
TIME SEEN: 5:05 AM  CHIEF COMPLAINT: Right-sided flank pain  HPI: Pt is a 82 y.o. male with history of coronary artery disease status post CABG, diabetes, hyperlipidemia, hypertension, atrial fibrillation not on anticoagulation, dCHF (EF 60-65% in Jan 2017) who lives at [facility] who presents emergency department right-sided flank pain that radiates into the right lower abdomen that is described as severe, sharp since 10 PM yesterday evening. No known aggravating or relieving factors. States he did have a fall backwards when he lost his balance 3 days ago onto his bottom. Did not injure his back or hit his head per his report. Has chronic lower extremity neuropathy but no new numbness or weakness. No fever. Has had nausea and vomiting tonight. No diarrhea. No dysuria or hematuria. No testicular pain or swelling. Has never had a kidney stone before. Is status post bilateral inguinal hernia repair. States he has had back surgery twice, last was 4-5 years ago. No new bowel or bladder incontinence.  PCP - Margo Aye  ROS: See HPI Constitutional: no fever  Eyes: no drainage  ENT: no runny nose   Cardiovascular:  no chest pain  Resp: no SOB  GI: no vomiting GU: no dysuria Integumentary: no rash  Allergy: no hives  Musculoskeletal: no leg swelling  Neurological: no slurred speech ROS otherwise negative  PAST MEDICAL HISTORY/PAST SURGICAL HISTORY:  Past Medical History  Diagnosis Date  . Coronary atherosclerosis of native coronary artery     Multivessel status post CABG  . Mixed hyperlipidemia   . Type 2 diabetes mellitus (HCC)   . Atrial fibrillation (HCC)   . Essential hypertension, benign     MEDICATIONS:  Prior to Admission medications   Medication Sig Start Date End Date Taking? Authorizing Provider  aspirin EC 81 MG tablet Take 81 mg by mouth every evening.     Historical Provider, MD  beta carotene w/minerals (OCUVITE) tablet Take 1 tablet by mouth every evening.     Historical  Provider, MD  dutasteride (AVODART) 0.5 MG capsule Take 0.5 mg by mouth every evening.     Historical Provider, MD  furosemide (LASIX) 20 MG tablet Take 1 tablet (20 mg total) by mouth daily. 11/20/15   Kajol Crispen N Demarri Elie, DO  gabapentin (NEURONTIN) 100 MG capsule Take 100 mg by mouth at bedtime. 09/30/14   Historical Provider, MD  ibuprofen (ADVIL,MOTRIN) 200 MG tablet Take 400 mg by mouth every 6 (six) hours as needed for moderate pain.    Historical Provider, MD  metFORMIN (GLUCOPHAGE) 500 MG tablet Take 1 tablet by mouth daily. 04/28/14   Historical Provider, MD  mirabegron ER (MYRBETRIQ) 25 MG TB24 tablet Take 50 mg by mouth every evening. *Adminstered in the evening*    Historical Provider, MD  Multiple Vitamin (MULTIVITAMIN WITH MINERALS) TABS tablet Take 1 tablet by mouth every morning.    Historical Provider, MD  omeprazole (PRILOSEC) 40 MG capsule Take 40 mg by mouth daily.    Historical Provider, MD  pindolol (VISKEN) 5 MG tablet Take 2.5 mg by mouth daily.  04/22/14   Historical Provider, MD  sitaGLIPtin (JANUVIA) 100 MG tablet Take 100 mg by mouth every evening.     Historical Provider, MD  valsartan (DIOVAN) 160 MG tablet Take 160 mg by mouth every evening.     Historical Provider, MD    ALLERGIES:  Allergies  Allergen Reactions  . Acetaminophen-Codeine     Unknown reaction-not listed on current MAR  . Advil [Ibuprofen] Other (See Comments)  Unknown reaction  . Penicillins     Unknown reaction-not listed on current MAR    SOCIAL HISTORY:  Social History  Substance Use Topics  . Smoking status: Former Smoker    Start date: 11/28/1944    Quit date: 11/29/1963  . Smokeless tobacco: Never Used  . Alcohol Use: No    FAMILY HISTORY: Family History  Problem Relation Age of Onset  . Diabetes      EXAM: BP 161/108 mmHg  Pulse 114  Temp(Src) 97.7 F (36.5 C) (Oral)  Resp 20  Ht 5\' 4"  (1.626 m)  Wt 183 lb (83.008 kg)  BMI 31.40 kg/m2  SpO2 97% CONSTITUTIONAL: Alert and  oriented and responds appropriately to questions. Elderly, hard of hearing, appears uncomfortable HEAD: Normocephalic EYES: Conjunctivae clear, PERRL ENT: normal nose; no rhinorrhea; moist mucous membranes NECK: Supple, no meningismus, no LAD  CARD: Irregularly irregular and tachycardic; S1 and S2 appreciated; no murmurs, no clicks, no rubs, no gallops RESP: Normal chest excursion without splinting or tachypnea; breath sounds clear and equal bilaterally; no wheezes, no rhonchi, no rales, no hypoxia or respiratory distress, speaking full sentences ABD/GI: Normal bowel sounds; non-distended; soft, tender to palpation in the right lower quadrant, no rebound, no guarding, no peritoneal signs, no hernia appreciated GU:  Normal external genitalia, circumcised male, normal penile shaft, no blood or discharge at the urethral meatus, no testicular masses or tenderness on exam, no scrotal masses or swelling, no hernias appreciated, 2+ femoral pulses bilaterally; no perineal erythema, warmth, subcutaneous air or crepitus; no high riding testicle, normal bilateral cremasteric reflex.  Chaperone present for exam. BACK:  The back appears normal and is slightly tender to palpation over the right lumbar paraspinal musculature, no midline spinal tenderness or step-off or deformity, no CVA tenderness EXT: Nontender over the right hip. Full range of motion in this joint. No leg length discrepancy. Normal ROM in all joints; non-tender to palpation; no edema; normal capillary refill; no cyanosis, no calf tenderness or swelling    SKIN: Normal color for age and race; warm; no rash NEURO: Moves all extremities equally, sensation to light touch intact diffusely, cranial nerves II through XII intact PSYCH: The patient's mood and manner are appropriate. Grooming and personal hygiene are appropriate.  MEDICAL DECISION MAKING: Patient here was sent onset right-sided flank pain. Did have a fall 3 days ago. No new neurologic  deficits. Differential diagnosis includes kidney stone, pyelonephritis, fracture.  Less likely cauda equina, epidural abscess or hematoma, discitis, transverse myelitis given no fever, neurologic deficit. We'll obtain labs, urine. Will give fentanyl, Zofran and reassess.  ED PROGRESS: 6:30 AM  Patient's labs show leukocytosis with left shift. He also has acute renal injury with creatinine of 2.14. Potassium is 5.2 with no significant EKG changes. He is afebrile. Was briefly hypoxic in the emergency department. Was a previous smoker. Lungs are clear to auscultation currently. We'll obtain chest x-ray.  CT scan shows obstructive 7 mm stone lodged within the right mid ureter with moderate right hydroureteronephrosis and a urinal and perinephric stranding. He has diverticulosis without diverticulitis. Urinalysis pending.  6:40 AM  Pt's oxygen dropped after receiving fentanyl. He is doing well on 2 L nasal cannula. Denies chest pain or shortness of breath. EKG shows bifascicular block and atrial fibrillation which is chronic. Chest x-ray shows diffuse interstitial opacity consistent with CHF. Will add on troponin and BNP. He is getting hydrated gently because of his acute kidney injury. Patient reports feeling better but is still  complaining of pain. We'll give another dose of fentanyl. I feel he will need admission because of his acute kidney injury. We'll discuss with urology on-call once his urinalysis has resulted.   7:00 AM  Pt's urine shows too numerous to count WBCs and too numerous RBCs as well as bacteria. Will send urine culture. Given his leukocytosis with left shift, will start IV ceftriaxone. Troponin is mildly positive at 0.05 but this is in the setting of acute kidney injury. BNP is elevated at 588.  Will stop IV hydration.  Patient has likely received 200 250 mL IV fluids in the emergency department. Urology consulted.      7:20 AM  D/w Dr. Normajean Glasgow with urology. Agrees patient will likely  need urgent stenting. We will keep him NPO.  He agrees with admission to medicine. He will see the patient in the [facility] emergency department.  Will d/w hospitalist.  PCP is Dr. Margo Aye.  Patient has been updated with plan.   I reviewed all nursing notes, vitals, pertinent old records, EKGs, labs, imaging (as available).    EKG Interpretation  Date/Time:  Tuesday March 22 2016 06:18:02 EDT Ventricular Rate:  111 PR Interval:    QRS Duration: 143 QT Interval:  389 QTC Calculation: 529 R Axis:   -59 Text Interpretation:  Atrial fibrillation RBBB and LAFB No significant change since last tracing Confirmed by Zamar Odwyer,  DO, Mujtaba Bollig [phone]) on 03/22/2016 6:31:13 AM        Layla Maw Hulen Mandler, DO 03/22/16 6045

## 2016-03-22 NOTE — ED Notes (Signed)
Pt continues to complain of pain, MD aware

## 2016-03-22 NOTE — ED Notes (Signed)
Per MD ok to re start fluids at 4475ml/hr, hydrate slowly

## 2016-03-22 NOTE — Anesthesia Procedure Notes (Addendum)
Spinal Patient location during procedure: OR Start time: 03/22/2016 4:35 PM Staffing Resident/CRNA: Glynn OctaveANIEL, Hermela Hardt E Preanesthetic Checklist Completed: patient identified, site marked, surgical consent, pre-op evaluation, timeout performed, IV checked, risks and benefits discussed and monitors and equipment checked Spinal Block Patient position: right lateral decubitus Prep: Betadine Patient monitoring: heart rate, cardiac monitor, continuous pulse ox and blood pressure Approach: right paramedian Location: L3-4 Injection technique: single-shot Needle Needle type: Spinocan  Needle gauge: 22 G Needle length: 9 cm Assessment Sensory level: T8 Additional Notes ATTEMPTS:1 TRAY [id number] TRAY EXPIRATION DATE:10/2016

## 2016-03-22 NOTE — Care Management Note (Signed)
Case Management Note  Patient Details  Name: [name] MRN: [id number] Date of Birth: 14-Mar-1918  Subjective/Objective:  From [facility] ,   Anticipated return to facility at discharge.         CSW to follow.        Action/Plan: Return to facility   Expected Discharge Date:  03/25/16               Expected Discharge Plan:  Skilled Nursing Facility  In-House Referral:  Clinical Social Work  Discharge planning Services  CM Consult  Post Acute Care Choice:    Choice offered to:     DME Arranged:    DME Agency:     HH Arranged:    HH Agency:     Status of Service:  Completed, signed off  Medicare Important Message Given:    Date Medicare IM Given:    Medicare IM give by:    Date Additional Medicare IM Given:    Additional Medicare Important Message give by:     If discussed at Long Length of Stay Meetings, dates discussed:    Additional Comments:  Adonis HugueninBerkhead, Hether Anselmo L, RN 03/22/2016, 2:11 PM

## 2016-03-22 NOTE — ED Notes (Signed)
Lab at the bedside 

## 2016-03-22 NOTE — ED Notes (Signed)
Stopped infusion per MD

## 2016-03-22 NOTE — ED Notes (Signed)
Pt sleeping 02 dropped to 86%, placed pt on 2L 02

## 2016-03-22 NOTE — ED Notes (Addendum)
[facility] right flank pain since 2200 yesterday. States he took tramadol at 0230 with no relief.

## 2016-03-22 NOTE — ED Notes (Signed)
MD at the bedside to assess, undressed pt, pt in a lot of pain, complaining of pain on right side, pt is heard of hearing

## 2016-03-22 NOTE — ED Notes (Signed)
Hospitalist at bedside 

## 2016-03-22 NOTE — ED Notes (Signed)
Dahlstedt MD (urology) at bedside.

## 2016-03-22 NOTE — H&P (Signed)
[facility] History and Physical  RASHEEN BELLS ZOX:[id number] DOB: 13-Jun-1918 DOA: 03/22/2016  Referring physician: Layla Maw Ward, DO PCP: Dwana Melena, MD   Chief Complaint: right-sided flank pain  HPI: [name] is a 82 y.o. male with a hx of CAD s/p CABG, DM type 2, HLD, HTN, A-fib not on anticoagulation, diastolic CHF who presentes to the ED with complaints of right-sided flank pain that radiates to his right lower abdomen that onset at 10:00 pm last night. History of somewhat limited since patient is very hard of hearing and is somewhat drowsy at this time due to recent pain medications. Most of history is obtained from medical record. He describes the pain as severe and sharp with no aggravating or alleviating factors. He reports having recently fallen 3 days ago when he lost his balance, but did not hit his head or injure his back. He denies any dysuria or hematuria. No reported no testicular pain or swelling. He has never had a kidney stone before.  While in the ED, workup showed elevated BUN and Cr, which is above his baseline. He was also hyperglycemic. BNP was 588 and troponin mildly elevated. His WBC was elevated at 19.7. UA showed only TNTC WBC and RBC. He was referred for admission.    Review of Systems:  Unable to assess due to mental status  Past Medical History  Diagnosis Date  . Coronary atherosclerosis of native coronary artery     Multivessel status post CABG  . Mixed hyperlipidemia   . Type 2 diabetes mellitus (HCC)   . Atrial fibrillation (HCC)   . Essential hypertension, benign    Past Surgical History  Procedure Laterality Date  . Tonsillectomy    . Cholecystectomy    . Knee arthroscopy    . Hernia repair    . Coronary artery bypass graft  1992    LIMA to LAD and diagonal, SVG to RCA, SVG to OM  . Back surgery    . Esophagogastroduodenoscopy (egd) with esophageal dilation N/A 09/12/2013    Procedure: ESOPHAGOGASTRODUODENOSCOPY (EGD) WITH  ESOPHAGEAL DILATION;  Surgeon: Malissa Hippo, MD;  Location: [facility];  Service: Endoscopy;  Laterality: N/A;   Social History:  reports that he quit smoking about 52 years ago. He started smoking about 71 years ago. He has never used smokeless tobacco. He reports that he does not drink alcohol or use illicit drugs.  Allergies  Allergen Reactions  . Acetaminophen-Codeine     Unknown reaction-not listed on current MAR  . Advil [Ibuprofen] Other (See Comments)    Unknown reaction  . Penicillins     Unknown reaction-not listed on current MAR    Family History  Problem Relation Age of Onset  . Diabetes      Prior to Admission medications   Medication Sig Start Date End Date Taking? Authorizing Provider  aspirin EC 81 MG tablet Take 81 mg by mouth every evening.     Historical Provider, MD  beta carotene w/minerals (OCUVITE) tablet Take 1 tablet by mouth every evening.     Historical Provider, MD  dutasteride (AVODART) 0.5 MG capsule Take 0.5 mg by mouth every evening.     Historical Provider, MD  furosemide (LASIX) 20 MG tablet Take 1 tablet (20 mg total) by mouth daily. 11/20/15   Kristen N Ward, DO  gabapentin (NEURONTIN) 100 MG capsule Take 100 mg by mouth at bedtime. 09/30/14   Historical Provider, MD  ibuprofen (ADVIL,MOTRIN) 200 MG tablet Take  400 mg by mouth every 6 (six) hours as needed for moderate pain.    Historical Provider, MD  metFORMIN (GLUCOPHAGE) 500 MG tablet Take 1 tablet by mouth daily. 04/28/14   Historical Provider, MD  mirabegron ER (MYRBETRIQ) 25 MG TB24 tablet Take 50 mg by mouth every evening. *Adminstered in the evening*    Historical Provider, MD  Multiple Vitamin (MULTIVITAMIN WITH MINERALS) TABS tablet Take 1 tablet by mouth every morning.    Historical Provider, MD  omeprazole (PRILOSEC) 40 MG capsule Take 40 mg by mouth daily.    Historical Provider, MD  pindolol (VISKEN) 5 MG tablet Take 2.5 mg by mouth daily.  04/22/14   Historical Provider, MD    sitaGLIPtin (JANUVIA) 100 MG tablet Take 100 mg by mouth every evening.     Historical Provider, MD  valsartan (DIOVAN) 160 MG tablet Take 160 mg by mouth every evening.     Historical Provider, MD   Physical Exam: Filed Vitals:   03/22/16 0630 03/22/16 0657 03/22/16 0700 03/22/16 0800  BP: 149/94 151/91 144/97 113/70  Pulse:  99 110 115  Temp:      TempSrc:      Resp: Height:      Weight:      SpO2: 98% 99% 99% 98%    Wt Readings from Last 3 Encounters:  03/22/16 83.008 kg (183 lb)  01/12/16 83.19 kg (183 lb 6.4 oz)  12/03/15 87.544 kg (193 lb)    General:  Appears calm and comfortable Eyes: PERRL, normal lids, irises & conjunctiva ENT: dry mucous membranes Neck: no LAD, masses or thyromegaly Cardiovascular: irregular, no m/r/g. 1+ LE edema. Telemetry: atrial fib Respiratory: coarse breath sounds at bases. Normal respiratory effort. Abdomen: soft, tender in RUQ and RLQ, BS+ Skin: no rash or induration seen on limited exam Musculoskeletal: grossly normal tone BUE/BLE Psychiatric: does not participate in exam Neurologic: grossly non-focal. Very hard of hearing          Labs on Admission:  Basic Metabolic Panel:  Recent Labs Lab 03/22/16 0540  NA 137  K 5.2*  CL 104  CO2 20*  GLUCOSE 207*  BUN 38*  CREATININE 2.14*  CALCIUM 9.0   Liver Function Tests:  Recent Labs Lab 03/22/16 0540  AST 26  ALT 15*  ALKPHOS 77  BILITOT 0.7  PROT 7.9  ALBUMIN 4.0    Recent Labs Lab 03/22/16 0540  LIPASE 26   No results for input(s): AMMONIA in the last 168 hours. CBC:  Recent Labs Lab 03/22/16 0540  WBC 19.7*  NEUTROABS 16.1*  HGB 11.8*  HCT 35.0*  MCV 93.3  PLT 160   Cardiac Enzymes:  Recent Labs Lab 03/22/16 0540  TROPONINI 0.05*    BNP (last 3 results)  Recent Labs  11/20/15 0100 11/21/15 2105 03/22/16 0540  BNP 402.0* 433.0* 588.0*    ProBNP (last 3 results) No results for input(s): PROBNP in the last 8760  hours.  CBG: No results for input(s): GLUCAP in the last 168 hours.  Radiological Exams on Admission: Ct Abdomen Pelvis Wo Contrast  03/22/2016  CLINICAL DATA:  Initial evaluation for acute right flank pain. EXAM: CT ABDOMEN AND PELVIS WITHOUT CONTRAST TECHNIQUE: Multidetector CT imaging of the abdomen and pelvis was performed following the standard protocol without IV contrast. COMPARISON:  Prior study from 01/29/2012. FINDINGS: Patchy bibasilar linear opacities favored to reflect atelectasis. No consolidative opacity identified. Cardiomegaly partially visualized. No significant pleural or pericardial fusion. Mitral  valvular calcifications noted. Limited noncontrast evaluation of the liver is unremarkable. Gallbladder is absent. No biliary dilatation. Spleen and adrenal glands within normal limits. Fatty atrophy of the pancreas noted. Left kidney within normal limits without evidence of nephrolithiasis or hydronephrosis. No radiopaque calculi seen along the course of the left renal collecting system. There is no left-sided hydroureter. On the right, there is an obstructive 7 mm stone lodged within the mid right ureter with secondary moderate right hydroureteronephrosis. Prominent right perinephric and periureteral fat stranding identified. Inflammatory stranding extends anteriorly towards the second and third portions of the duodenum. Possible additional 5 mm stone within the upper pole right kidney. No other stones along the course of the right ureter. Small hiatal hernia present. Stomach within normal limits. Again, there is inflammatory stranding about the duodenum. This is felt to be related to inflammatory changes from the right kidney rather than acute duodenitis. No evidence for bowel obstruction. No other acute inflammatory changes about the bowels. Sigmoid diverticulosis without evidence for acute diverticulitis. Appendix is normal. Bladder within normal limits.  Prostate normal. No free air or  fluid. No pathologically enlarged intra-abdominal or pelvic lymph nodes. Extensive atheromatous plaque throughout the intra-abdominal aorta. No aneurysm. A fatty atrophy of the left psoas muscle noted. No acute osseous abnormality. No worrisome lytic or blastic osseous lesions. Patient is status post spinal is a fusion at L4-5. IMPRESSION: 1. Obstructive 7 mm stone lodged within the mid right ureter with secondary moderate right hydroureteronephrosis and prominent periureteral and perinephric fat stranding. 2. No other acute intra-abdominal or pelvic process. 3. Colonic diverticulosis without evidence for acute diverticulitis. 4. Cardiomegaly with advanced atheromatous plaque throughout the intra-abdominal aorta. No aneurysm. Electronically Signed   By: Rise MuBenjamin  McClintock M.D.   On: 03/22/2016 06:12   Dg Chest 2 View  03/22/2016  CLINICAL DATA:  Hypoxia this morning EXAM: CHEST  2 VIEW COMPARISON:  11/21/2015 FINDINGS: Diffuse interstitial opacity. Chronic cardiomegaly. Stable aortic and hilar contours. The patient is status post CABG. No pleural effusion or air leak. IMPRESSION: CHF. Electronically Signed   By: Marnee SpringJonathon  Watts M.D.   On: 03/22/2016 06:36    EKG: Independently reviewed. Atrial fib  Assessment/Plan Active Problems:   Diabetes mellitus type 2 with complications (HCC)   ATRIAL FIBRILLATION   GERD (gastroesophageal reflux disease)   Kidney stone   Hydroureteronephrosis   UTI (lower urinary tract infection)   Hyperkalemia   CKD stage 3 secondary to diabetes (HCC)   Chronic diastolic CHF (congestive heart failure) (HCC)   AKI (acute kidney injury) (HCC)   1. Obstructing right kidney stone. Seen by urology and plans will be for right ureteral stent placement later this afternoon. Will keep patient npo for now. 2. UTI. Start the patient on rocephin. Follow up urine culture 3. A fib, chronic. Not a candidate for anticoagulation due to history of falls. Will continue on BB 4. AKI on  CKD 3. Creatinine above baseline, likely related to #1 and #2. Will hold ARB. Provide gentle hydration 5. Chronic diastolic CHF. Clinically, appears compensated. Will continue home dose of lasix 6. Hyperkalemia. Holding ARB. Should correct with fluids/lasix 7. GERD. Continue PPI 8. Diabetes type 2. Hold oral metformin and januvia. Will use SSI for now  Urology consulted.  Code Status: DNR DVT Prophylaxis: SCDs for now, can start heparin after procedure Family Communication: discussed with daughter who is HCPOA Disposition Plan: [facility]  Time spent: 60mins  Darden RestaurantsJehanzeb Dedee Liss [facility] Pager [phone]

## 2016-03-22 NOTE — Progress Notes (Signed)
Awake. Diet sprite given to drink. Tolerated well. Bilateral hearing aids in place. Denies pain.

## 2016-03-22 NOTE — Anesthesia Preprocedure Evaluation (Signed)
Anesthesia Evaluation  Patient identified by MRN, date of birth, ID band Patient awake    Reviewed: Allergy & Precautions, NPO status , Patient's Chart, lab work & pertinent test results  History of Anesthesia Complications (+) DIFFICULT AIRWAY  Airway Mallampati: IV  TM Distance: >3 FB     Dental  (+) Teeth Intact, Missing, Dental Advisory Given, Chipped,    Pulmonary former smoker,     + decreased breath sounds      Cardiovascular hypertension, Pt. on medications + CAD and +CHF  + dysrhythmias Atrial Fibrillation  Rhythm:Irregular     Neuro/Psych    GI/Hepatic GERD  Medicated and Controlled,  Endo/Other  diabetes, Well Controlled, Type 2, Oral Hypoglycemic Agents  Renal/GU Renal InsufficiencyRenal disease     Musculoskeletal  (+) Arthritis , Osteoarthritis,    Abdominal Normal abdominal exam  (+)   Peds  Hematology   Anesthesia Other Findings   Reproductive/Obstetrics                             Anesthesia Physical Anesthesia Plan  ASA: IV  Anesthesia Plan: Spinal   Post-op Pain Management:    Induction:   Airway Management Planned: Nasal Cannula  Additional Equipment:   Intra-op Plan:   Post-operative Plan:   Informed Consent: I have reviewed the patients History and Physical, chart, labs and discussed the procedure including the risks, benefits and alternatives for the proposed anesthesia with the patient or authorized representative who has indicated his/her understanding and acceptance.   Dental advisory given  Plan Discussed with: CRNA  Anesthesia Plan Comments:         Anesthesia Quick Evaluation

## 2016-03-22 NOTE — Progress Notes (Signed)
Bilateral  Hearing aids in place.

## 2016-03-22 NOTE — Op Note (Signed)
Preoperative diagnosis: Right proximal ureteral stone with hydronephrosis and associated urinary tract infection  Postoperative diagnosis: Same  Principal procedure: Cystoscopy, right retrograde ureteropyelogram with interpretive fluoroscopy, right double-J stent placement-6 [place]French by 24 cm without string  Surgeon: Dominika Losey  Anesthesia: Subarachnoid block  Complications: None  Estimated blood loss: None  Drains: Double-J stent-24 cm x 6 [place]French, contour  Indications: 82 year old male who presented to the emergency room here today with an infected, obstructing right ureteral stone. He had associated leukocytosis. Because of his age, medical condition, the presence of hydronephrosis with infection in his stone, it was recommended that we perform urgent double-J stent placement decompression of his infected right kidney. I have discussed this with the patient, who has been lethargic, as well as his daughter, Nettie ElmSylvia. Options were discussed regarding no drainage. The patient and his daughter wish to proceed.  Description of procedure: The patient was properly marked and identified in the holding area. He had received preoperative IV Rocephin. He was taken the operating room where subarachnoid block was performed. He was then placed in the dorsolithotomy position. Genitalia and perineum were prepped and draped. Proper timeout was performed.  A 21 French panendoscope was advanced under direct vision through his urethra. Urethra was out lesion. There is a mild bladder neck contracture and a hollow doubt prostatic fossa consistent with prior TURP. The beak of the scope was easily introduced into the bladder which was then inspected. There were no tumors, trabeculations or foreign bodies. Mild erythema of the bladder consistent with infection. The right ureteral orifice was cannulated with a 6 [place]French open-ended catheter. Gentle retrograde ureteropyelogram was performed.  This revealed a normal-appearing  ureter throughout, with the accepting of filling defect a proximally 3 cm distal to the UPJ. There was proximal pyelocaliectasis. I saw no other filling defect.  Following this, a 0.038 inch sensor-tip guidewire was advanced easily past the obstructing stone and into the upper pole calyceal system. Over top of this, using fluoroscopic and cystoscopic guidance, a 24 cm x 6 French contour double-J stent was placed, with the string removed. Once the stent was deployed, excellent proximal and distal curls were seen on the stent. The bladder was then drained. The scope was removed and the procedure was terminated. The patient was then taken to the PACU in stable condition.

## 2016-03-22 NOTE — ED Notes (Signed)
MD at the bedside  

## 2016-03-23 ENCOUNTER — Encounter (HOSPITAL_COMMUNITY): Payer: Self-pay | Admitting: Urology

## 2016-03-23 LAB — GLUCOSE, CAPILLARY
GLUCOSE-CAPILLARY: 137 mg/dL — AB (ref 65–99)
GLUCOSE-CAPILLARY: 134 mg/dL — AB (ref 65–99)
GLUCOSE-CAPILLARY: 128 mg/dL — AB (ref 65–99)
Glucose-Capillary: 189 mg/dL — ABNORMAL HIGH (ref 65–99)

## 2016-03-23 LAB — COMPREHENSIVE METABOLIC PANEL
ALK PHOS: 57 U/L (ref 38–126)
ALT: 12 U/L — AB (ref 17–63)
AST: 21 U/L (ref 15–41)
Albumin: 3 g/dL — ABNORMAL LOW (ref 3.5–5.0)
Anion gap: 8 (ref 5–15)
BUN: 38 mg/dL — AB (ref 6–20)
CALCIUM: 8.4 mg/dL — AB (ref 8.9–10.3)
CHLORIDE: 107 mmol/L (ref 101–111)
CO2: 23 mmol/L (ref 22–32)
CREATININE: 1.82 mg/dL — AB (ref 0.61–1.24)
GFR calc Af Amer: 34 mL/min — ABNORMAL LOW (ref >60)
GFR calc non Af Amer: 30 mL/min — ABNORMAL LOW (ref >60)
GLUCOSE: 143 mg/dL — AB (ref 65–99)
Potassium: 4.5 mmol/L (ref 3.5–5.1)
SODIUM: 138 mmol/L (ref 135–145)
Total Bilirubin: 0.9 mg/dL (ref 0.3–1.2)
Total Protein: 6.3 g/dL — ABNORMAL LOW (ref 6.5–8.1)

## 2016-03-23 LAB — CBC
HCT: 29 % — ABNORMAL LOW (ref 39.0–52.0)
Hemoglobin: 10 g/dL — ABNORMAL LOW (ref 13.0–17.0)
MCH: 32.3 pg (ref 26.0–34.0)
MCHC: 34.5 g/dL (ref 30.0–36.0)
MCV: 93.5 fL (ref 78.0–100.0)
PLATELETS: 128 10*3/uL — AB (ref 150–400)
RBC: 3.1 MIL/uL — ABNORMAL LOW (ref 4.22–5.81)
RDW: 15.2 % (ref 11.5–15.5)
WBC: 17 10*3/uL — ABNORMAL HIGH (ref 4.0–10.5)

## 2016-03-23 MED ORDER — SODIUM CHLORIDE 0.9 % IV SOLN: INTRAVENOUS | Status: AC

## 2016-03-23 MED ORDER — CALCIUM CARBONATE ANTACID 500 MG PO CHEW
1.0000 | CHEWABLE_TABLET | Freq: Three times a day (TID) | ORAL | Status: DC | PRN
  Administered 2016-03-23: 200 mg via ORAL
  Filled 2016-03-23: qty 1

## 2016-03-23 NOTE — Addendum Note (Signed)
Addendum  created 03/23/16 1625 by Despina Hiddenobert J Pleasant Britz, CRNA   Modules edited: Clinical Notes   Clinical Notes:  File: [id number]

## 2016-03-23 NOTE — Clinical Social Work Note (Signed)
Clinical Social Work Assessment  Patient Details  Name: [name] MRN: [id number] Date of Birth: 1918-09-18  Date of referral:  03/23/16               Reason for consult:  Discharge Planning                Permission sought to share information with:    Permission granted to share information::     Name::        Agency::     Relationship::     Contact Information:     Housing/Transportation Living arrangements for the past 2 months:  [place] of Information:  Patient Patient Interpreter Needed:  None Criminal Activity/Legal Involvement Pertinent to Current Situation/Hospitalization:  No - Comment as needed Significant Relationships:  Adult Children Lives with:  Facility Resident Do you feel safe going back to the place where you live?  Yes Need for family participation in patient care:  Yes (Comment)  Care giving concerns:  Pt is long term resident at [facility].    Social Worker assessment / plan:  CSW met with pt at bedside. Pt alert and oriented and reports that he has been a resident at [facility] for about 3 years. He indicates that his daughter lives in [place] and was here yesterday, but he does not see her too often. Pt shared that he has church friends at facility, but it is "not home."  Brief support provided. Per Tammy at facility, pt is on AL unit and okay to return. He does not have any home health services at this time. Pt requires limited assist with ADLs and ambulates with a walker.   Employment status:  Retired Nurse, adult PT Recommendations:  Not assessed at this time Information / Referral to community resources:  Other (Comment Required) (return to [facility])  Patient/Family's Response to care:  Pt requests to return to [facility] when medically stable.   Patient/Family's Understanding of and Emotional Response to Diagnosis, Current Treatment, and Prognosis:  Pt appears to have understanding of admission  diagnosis.   Emotional Assessment Appearance:  Appears younger than stated age Attitude/Demeanor/Rapport:  Other (Pleasant) Affect (typically observed):  Appropriate Orientation:  Oriented to Self, Oriented to Place, Oriented to  Time, Oriented to Situation Alcohol / Substance use:  Not Applicable Psych involvement (Current and /or in the community):  No (Comment)  Discharge Needs  Concerns to be addressed:  Discharge Planning Concerns Readmission within the last 30 days:  No Current discharge risk:  None Barriers to Discharge:  Continued Medical Work up   [employer], [employer], Wall 03/23/2016, 10:19 AM [phone]

## 2016-03-23 NOTE — Evaluation (Signed)
Physical Therapy Evaluation Patient Details Name: [name] MRN: [id number] DOB: [birth year] Today's Date: 03/23/2016   History of Present Illness  82 yo M admitted with c/o R flank pain with recent fall x3days ago.  Dx: obstructing kidney stone s/p stenting 3/25, & UTI.  PMH: DM2, Afib, GERD, kidney stone, hydrotreteronephrosis, CKD stage 3, chronic diastolic CHF, and AKI.   Clinical Impression  Pt received sitting up on the EOB attempting to urinate.  Pt was not successful, but still with the urge, therefore PT assisted pt into the bathroom.  Pt demonstrated posterior LOB during sit<>stand transfer, and required CGA.  After voiding, pt was able to ambulate 5260ft with rollator walker, and CGA, but limited due to fatigue today.  Pt states that he can normally go further at his ALF, especially when walking to the dining hall.  Due to pt's recent fall, as well as balance deficits, he would benefit from HHPT to progress endurance, balance, and strength to return to PLOF.     Follow Up Recommendations Home health PT    Equipment Recommendations  None recommended by PT    Recommendations for Other Services       Precautions / Restrictions Precautions Precautions: Fall Precaution Comments: Pt with recent fall 3 days prior to admission.  Restrictions Weight Bearing Restrictions: No      Mobility  Bed Mobility Overal bed mobility:  (Not assessed, pt already sitting up on the EOB upon entering the room, and left sitting up in the char at end of session. )                Transfers Overall transfer level: Needs assistance Equipment used: 4-wheeled walker (Pt also has a RW and QC in the room) Transfers: Sit to/from Stand Sit to Stand: Min guard;Supervision         General transfer comment: Initial posterior LOB upon standing  Ambulation/Gait Ambulation/Gait assistance: Supervision;Min guard Ambulation Distance (Feet): 60 Feet Assistive device: 4-wheeled walker Gait  Pattern/deviations: Decreased stride length;Step-through pattern     General Gait Details: Severe over pronation noted of the L foot.  Pt states he normally can go further  Stairs            Wheelchair Mobility    Modified Rankin (Stroke Patients Only)       Balance Overall balance assessment: Needs assistance         Standing balance support: Bilateral upper extremity supported Standing balance-Leahy Scale: Fair                               Pertinent Vitals/Pain Pain Assessment: No/denies pain    Home Living Family/patient expects to be discharged to:: Assisted living     Type of Home: Assisted living         Home Equipment: Dan HumphreysWalker - 4 wheels;Cane - quad;Walker - 2 wheels      Prior Function Level of Independence: Needs assistance   Gait / Transfers Assistance Needed: Pt mostly uses his rollator for ambulation in the facility.  Pt is able to ambulate down to the dining hall.  Pt states that he also "takes an exercise class every day" where he uses 3# weights.   ADL's / Homemaking Assistance Needed: Pt states that he is able to dress himself, and states that they do not assist him with bathing, however it is more likely that the staff at his ALF assists him with bathing.  Hand Dominance        Extremity/Trunk Assessment   Upper Extremity Assessment: Overall WFL for tasks assessed           Lower Extremity Assessment: Overall WFL for tasks assessed         Communication   Communication: HOH  Cognition Arousal/Alertness: Awake/alert Behavior During Therapy: WFL for tasks assessed/performed Overall Cognitive Status: Within Functional Limits for tasks assessed                      General Comments      Exercises        Assessment/Plan    PT Assessment Patient needs continued PT services  PT Diagnosis Difficulty walking   PT Problem List Decreased activity tolerance;Decreased balance;Decreased  mobility;Cardiopulmonary status limiting activity;Decreased safety awareness;Decreased strength  PT Treatment Interventions Gait training;Functional mobility training;Therapeutic activities;Therapeutic exercise;Balance training;Patient/family education   PT Goals (Current goals can be found in the Care Plan section) Acute Rehab PT Goals Patient Stated Goal: Pt wants to go home.  PT Goal Formulation: With patient Time For Goal Achievement: 03/30/16 Potential to Achieve Goals: Fair    Frequency Min 3X/week   Barriers to discharge        Co-evaluation               End of Session Equipment Utilized During Treatment: Gait belt Activity Tolerance: Patient tolerated treatment well Patient left: in chair;with call bell/phone within reach;with chair alarm set      Functional Assessment Tool Used: Clinical Judgement.  Functional Limitation: Mobility: Walking and moving around Mobility: Walking and Moving Around Current Status [phone]): At least 20 percent but less than 40 percent impaired, limited or restricted Mobility: Walking and Moving Around Goal Status [phone]): At least 1 percent but less than 20 percent impaired, limited or restricted    Time: 1135-1159 PT Time Calculation (min) (ACUTE ONLY): 24 min   Charges:   PT Evaluation $PT Eval Low Complexity: 1 Procedure PT Treatments $Therapeutic Activity: 8-22 mins   PT G Codes:   PT G-Codes **NOT FOR INPATIENT CLASS** Functional Assessment Tool Used: Clinical Judgement.  Functional Limitation: Mobility: Walking and moving around Mobility: Walking and Moving Around Current Status [phone]): At least 20 percent but less than 40 percent impaired, limited or restricted Mobility: Walking and Moving Around Goal Status [phone]): At least 1 percent but less than 20 percent impaired, limited or restricted    [name] 03/23/2016, 1:20 PM

## 2016-03-23 NOTE — Care Management Important Message (Signed)
Important Message  Patient Details  Name: Stormy CardGeorge W Blomgren MRN: [id number] Date of Birth: Jan 18, 1918   Medicare Important Message Given:  Yes    Adonis HugueninBerkhead, Rosamund Nyland L, RN 03/23/2016, 1:50 PM

## 2016-03-23 NOTE — Anesthesia Postprocedure Evaluation (Signed)
Anesthesia Post Note Late entry Patient: [name]  Procedure(s) Performed: Procedure(s) (LRB): CYSTOSCOPY WITH RIGHT RETROGRADE PYELOGRAM, RIGHT URETERAL STENT PLACEMENT (Right)  Patient location during evaluation: PACU Anesthesia Type: Spinal Level of consciousness: awake and alert and oriented Pain management: pain level controlled Vital Signs Assessment: post-procedure vital signs reviewed and stable Respiratory status: spontaneous breathing Cardiovascular status: stable Anesthetic complications: no    Last Vitals:  Filed Vitals:   03/22/16 2300 03/23/16 0534  BP: 116/60 108/71  Pulse: 75 76  Temp: 36.7 C 36.9 C  Resp: 20 20    Last Pain:  Filed Vitals:   03/23/16 0953  PainSc: 0-No pain                 ADAMS, AMY A

## 2016-03-23 NOTE — Progress Notes (Signed)
1 Day Post-Op Subjective: Patient reports improved pain control. He urinated after foley was removed. Pt is having gross hematuria. Afebrile. Urine culture growing Proteus.  Objective: Vital signs in last 24 hours: Temp:  [98 F (36.7 C)-98.4 F (36.9 C)] 98 F (36.7 C) (04/26 1700) Pulse Rate:  [75-76] 75 (04/26 1700) Resp:  [20] 20 (04/26 1700) BP: (96-116)/(55-71) 96/55 mmHg (04/26 1700) SpO2:  [93 %-95 %] 93 % (04/26 2021)  Intake/Output from previous day: 04/25 0701 - 04/26 0700 In: 600 [I.V.:600] Out: 200 [Urine:200] Intake/Output this shift:    Physical Exam:  General:alert, cooperative and appears stated age GI: soft, non tender, normal bowel sounds, no palpable masses, no organomegaly, no inguinal hernia Male genitalia: not done Extremities: extremities normal, atraumatic, no cyanosis or edema  Lab Results:  Recent Labs  03/22/16 0540 03/23/16 0504  HGB 11.8* 10.0*  HCT 35.0* 29.0*   BMET  Recent Labs  03/22/16 0540 03/23/16 0504  NA 137 138  K 5.2* 4.5  CL 104 107  CO2 20* 23  GLUCOSE 207* 143*  BUN 38* 38*  CREATININE 2.14* 1.82*  CALCIUM 9.0 8.4*   No results for input(s): LABPT, INR in the last 72 hours. No results for input(s): LABURIN in the last 72 hours. Results for orders placed or performed during the hospital encounter of 03/22/16  Urine culture     Status: Abnormal (Preliminary result)   Collection Time: 03/22/16  6:15 AM  Result Value Ref Range Status   Specimen Description URINE, CLEAN CATCH  Final   Special Requests NONE  Final   Culture 40,000 COLONIES/mL PROTEUS MIRABILIS (A)  Final   Report Status PENDING  Incomplete  Surgical pcr screen     Status: Abnormal   Collection Time: 03/22/16  9:50 AM  Result Value Ref Range Status   MRSA, PCR NEGATIVE NEGATIVE Final   Staphylococcus aureus POSITIVE (A) NEGATIVE Final    Comment:        The Xpert SA Assay (FDA approved for NASAL specimens in patients over 78 years of age), is  one component of a comprehensive surveillance program.  Test performance has been validated by [facility] for patients greater than or equal to 84 year old. It is not intended to diagnose infection nor to guide or monitor treatment. RESULT CALLED TO, READ BACK BY AND VERIFIED WITH: SHREVE,P. AT 1600 ON 03/22/2016 BY BAUGHAM,M.     Studies/Results: Ct Abdomen Pelvis Wo Contrast  03/22/2016  CLINICAL DATA:  Initial evaluation for acute right flank pain. EXAM: CT ABDOMEN AND PELVIS WITHOUT CONTRAST TECHNIQUE: Multidetector CT imaging of the abdomen and pelvis was performed following the standard protocol without IV contrast. COMPARISON:  Prior study from 01/29/2012. FINDINGS: Patchy bibasilar linear opacities favored to reflect atelectasis. No consolidative opacity identified. Cardiomegaly partially visualized. No significant pleural or pericardial fusion. Mitral valvular calcifications noted. Limited noncontrast evaluation of the liver is unremarkable. Gallbladder is absent. No biliary dilatation. Spleen and adrenal glands within normal limits. Fatty atrophy of the pancreas noted. Left kidney within normal limits without evidence of nephrolithiasis or hydronephrosis. No radiopaque calculi seen along the course of the left renal collecting system. There is no left-sided hydroureter. On the right, there is an obstructive 7 mm stone lodged within the mid right ureter with secondary moderate right hydroureteronephrosis. Prominent right perinephric and periureteral fat stranding identified. Inflammatory stranding extends anteriorly towards the second and third portions of the duodenum. Possible additional 5 mm stone within the upper pole right  kidney. No other stones along the course of the right ureter. Small hiatal hernia present. Stomach within normal limits. Again, there is inflammatory stranding about the duodenum. This is felt to be related to inflammatory changes from the right kidney rather than  acute duodenitis. No evidence for bowel obstruction. No other acute inflammatory changes about the bowels. Sigmoid diverticulosis without evidence for acute diverticulitis. Appendix is normal. Bladder within normal limits.  Prostate normal. No free air or fluid. No pathologically enlarged intra-abdominal or pelvic lymph nodes. Extensive atheromatous plaque throughout the intra-abdominal aorta. No aneurysm. A fatty atrophy of the left psoas muscle noted. No acute osseous abnormality. No worrisome lytic or blastic osseous lesions. Patient is status post spinal is a fusion at L4-5. IMPRESSION: 1. Obstructive 7 mm stone lodged within the mid right ureter with secondary moderate right hydroureteronephrosis and prominent periureteral and perinephric fat stranding. 2. No other acute intra-abdominal or pelvic process. 3. Colonic diverticulosis without evidence for acute diverticulitis. 4. Cardiomegaly with advanced atheromatous plaque throughout the intra-abdominal aorta. No aneurysm. Electronically Signed   By: Rise MuBenjamin  McClintock M.D.   On: 03/22/2016 06:12   Dg Chest 2 View  03/22/2016  CLINICAL DATA:  Hypoxia this morning EXAM: CHEST  2 VIEW COMPARISON:  11/21/2015 FINDINGS: Diffuse interstitial opacity. Chronic cardiomegaly. Stable aortic and hilar contours. The patient is status post CABG. No pleural effusion or air leak. IMPRESSION: CHF. Electronically Signed   By: Marnee SpringJonathon  Watts M.D.   On: 03/22/2016 06:36   Dg C-arm 1-60 Min-no Report  03/22/2016  CLINICAL DATA: stent placement rt ureteral stone C-ARM 1-60 MINUTES Fluoroscopy was utilized by the requesting physician.  No radiographic interpretation.    Assessment/Plan: 82yo with right ureteral calculus and UTI  Recs:  1. Continue rocephin pending urine culture 2. The patient will need 14 days of antibiotics prior to ureteral stone extraction. The surgery will be scheduled as an outpatient pending resolution of the patients UTI. Please call with  additional questions     LOS: 1 day   [name] 03/23/2016, 8:56 PM

## 2016-03-23 NOTE — Anesthesia Postprocedure Evaluation (Signed)
Anesthesia Post Note  Patient: [name]  Procedure(s) Performed: Procedure(s) (LRB): CYSTOSCOPY WITH RIGHT RETROGRADE PYELOGRAM, RIGHT URETERAL STENT PLACEMENT (Right)  Patient location during evaluation: Nursing Unit Anesthesia Type: Spinal Level of consciousness: awake and alert, oriented and patient cooperative Pain management: pain level controlled Vital Signs Assessment: post-procedure vital signs reviewed and stable Respiratory status: spontaneous breathing, nonlabored ventilation and respiratory function stable Cardiovascular status: blood pressure returned to baseline Postop Assessment: no backache, no signs of nausea or vomiting and adequate PO intake Anesthetic complications: no    Last Vitals:  Filed Vitals:   03/22/16 2300 03/23/16 0534  BP: 116/60 108/71  Pulse: 75 76  Temp: 36.7 C 36.9 C  Resp: 20 20    Last Pain:  Filed Vitals:   03/23/16 0953  PainSc: 0-No pain                 Talli Kimmer J

## 2016-03-23 NOTE — Progress Notes (Signed)
PROGRESS NOTE    [name]  RUE:[id number] DOB: Apr 21, 1918 DOA: 03/22/2016 PCP: Dwana Melena, MD  Outpatient Specialists: Brief Narrative: [name] is a 82 y.o. male with a hx of CAD s/p CABG, DM type 2, HLD, HTN, A-fib not on anticoagulation, diastolic CHF who presented to the ED with complaints of right-sided flank pain that radiated to his right lower abdomen that started at 10:00 pm 4/24 night. FOund to have complicated UTI with obstructing stone, Urology consulted, s/p Cysto and JJ stent 4/25   Assessment & Plan: 1.  Complicated UTI with Obstructing right kidney stone.  -Urology consult appreciated, s/p Cystoscopy and JJ stent placement last pm -continue ceftriaxone, FU Urine Cx -improving  2. UTI.  -see above, FU cultures  3. A fib, chronic. Not a candidate for anticoagulation due to history of falls -rate controlled, continue on BB  4. AKI on CKD 3.  Baseline creatinine 1.46, likely related to #1 and ARB -Will hold ARB, continue IVF for 6 more hours today -improving from admission  5. Chronic diastolic CHF. - Clinically appears compensated, stop IVF and resume home dose of lasix  6. Hyperkalemia.  -Holding ARB, corrected  7. GERD. Continue PPI  8. Diabetes type 2. - Hold oral metformin and januvia - SSI for now  Code Status: DNR DVT Prophylaxis: lovenox Family Communication:none at bedside Disposition Plan: return to [facility] in 1-2days  Consultants:   Urology Dr.Dahlstedt  Procedures: Cystoscopy, right retrograde ureteropyelogram with interpretive fluoroscopy, right double-J stent placement-6 French by 24 cm    Antimicrobials: rocephin 4/25  Subjective: Feels better, breathing ok  Objective: Filed Vitals:   03/22/16 1809 03/22/16 1947 03/22/16 2300 03/23/16 0534  BP: 116/49  116/60 108/71  Pulse: 67  75 76  Temp: 98.4 F (36.9 C)  98 F (36.7 C) 98.4 F (36.9 C)  TempSrc: Axillary  Oral Oral  Resp: Height:        Weight:      SpO2: 96% 95% 94% 94%    Intake/Output Summary (Last 24 hours) at 03/23/16 1743 Last data filed at 03/23/16 0900  Gross per 24 hour  Intake    240 ml  Output    325 ml  Net    -85 ml   Filed Weights   03/22/16 0451 03/22/16 0928 03/22/16 1455  Weight: 83.008 kg (183 lb) 82.4 kg (181 lb 10.5 oz) 82.101 kg (181 lb)    Examination:  General exam: Appears calm and comfortable, no distress, hard of hearing Respiratory system: Clear to auscultation. Respiratory effort normal. Cardiovascular system: S1 & S2 heard, RRR. No JVD, murmurs, rubs, gallops or clicks. No pedal edema. Gastrointestinal system: Abdomen is nondistended, soft and nontender. No organomegaly or masses felt. Normal bowel sounds heard. Central nervous system: Alert and oriented. No focal neurological deficits. Extremities: Symmetric 5 x 5 power. Skin: No rashes, lesions or ulcers Psychiatry: Judgement and insight appear normal. Mood & affect appropriate.     Data Reviewed: I have personally reviewed following labs and imaging studies  CBC:  Recent Labs Lab 03/22/16 0540 03/23/16 0504  WBC 19.7* 17.0*  NEUTROABS 16.1*  --   HGB 11.8* 10.0*  HCT 35.0* 29.0*  MCV 93.3 93.5  PLT 160 128*   Basic Metabolic Panel:  Recent Labs Lab 03/22/16 0540 03/23/16 0504  NA 137 138  K 5.2* 4.5  CL 104 107  CO2 20* 23  GLUCOSE 207* 143*  BUN 38* 38*  CREATININE 2.14* 1.82*  CALCIUM 9.0 8.4*   GFR: Estimated Creatinine Clearance: 23 mL/min (by C-G formula based on Cr of 1.82). Liver Function Tests:  Recent Labs Lab 03/22/16 0540 03/23/16 0504  AST 26 21  ALT 15* 12*  ALKPHOS 77 57  BILITOT 0.7 0.9  PROT 7.9 6.3*  ALBUMIN 4.0 3.0*    Recent Labs Lab 03/22/16 0540  LIPASE 26   No results for input(s): AMMONIA in the last 168 hours. Coagulation Profile: No results for input(s): INR, PROTIME in the last 168 hours. Cardiac Enzymes:  Recent Labs Lab 03/22/16 0540  TROPONINI 0.05*    BNP (last 3 results) No results for input(s): PROBNP in the last 8760 hours. HbA1C: No results for input(s): HGBA1C in the last 72 hours. CBG:  Recent Labs Lab 03/22/16 1719 03/22/16 1815 03/23/16 0829 03/23/16 1141 03/23/16 1642  GLUCAP 151* 148* 137* 134* 128*   Lipid Profile: No results for input(s): CHOL, HDL, LDLCALC, TRIG, CHOLHDL, LDLDIRECT in the last 72 hours. Thyroid Function Tests: No results for input(s): TSH, T4TOTAL, FREET4, T3FREE, THYROIDAB in the last 72 hours. Anemia Panel: No results for input(s): VITAMINB12, FOLATE, FERRITIN, TIBC, IRON, RETICCTPCT in the last 72 hours. Urine analysis:    Component Value Date/Time   COLORURINE AMBER* 03/22/2016 0617   APPEARANCEUR CLOUDY* 03/22/2016 0617   LABSPEC 1.010 03/22/2016 0617   PHURINE 5.5 03/22/2016 0617   GLUCOSEU NEGATIVE 03/22/2016 0617   HGBUR LARGE* 03/22/2016 0617   BILIRUBINUR NEGATIVE 03/22/2016 0617   KETONESUR NEGATIVE 03/22/2016 0617   PROTEINUR 30* 03/22/2016 0617   UROBILINOGEN 0.2 01/29/2012 2204   NITRITE NEGATIVE 03/22/2016 0617   LEUKOCYTESUR MODERATE* 03/22/2016 0617   Sepsis Labs: (procalcitonin:4,lacticidven:4)  ) Recent Results (from the past 240 hour(s))  Urine culture     Status: Abnormal (Preliminary result)   Collection Time: 03/22/16  6:15 AM  Result Value Ref Range Status   Specimen Description URINE, CLEAN CATCH  Final   Special Requests NONE  Final   Culture 40,000 COLONIES/mL PROTEUS MIRABILIS (A)  Final   Report Status PENDING  Incomplete  Surgical pcr screen     Status: Abnormal   Collection Time: 03/22/16  9:50 AM  Result Value Ref Range Status   MRSA, PCR NEGATIVE NEGATIVE Final   Staphylococcus aureus POSITIVE (A) NEGATIVE Final    Comment:        The Xpert SA Assay (FDA approved for NASAL specimens in patients over 59 years of age), is one component of a comprehensive surveillance program.  Test performance has been validated by [facility]  for patients greater than or equal to 93 year old. It is not intended to diagnose infection nor to guide or monitor treatment. RESULT CALLED TO, READ BACK BY AND VERIFIED WITH: SHREVE,P. AT 1600 ON 03/22/2016 BY BAUGHAM,M.          Radiology Studies: Ct Abdomen Pelvis Wo Contrast  03/22/2016  CLINICAL DATA:  Initial evaluation for acute right flank pain. EXAM: CT ABDOMEN AND PELVIS WITHOUT CONTRAST TECHNIQUE: Multidetector CT imaging of the abdomen and pelvis was performed following the standard protocol without IV contrast. COMPARISON:  Prior study from 01/29/2012. FINDINGS: Patchy bibasilar linear opacities favored to reflect atelectasis. No consolidative opacity identified. Cardiomegaly partially visualized. No significant pleural or pericardial fusion. Mitral valvular calcifications noted. Limited noncontrast evaluation of the liver is unremarkable. Gallbladder is absent. No biliary dilatation. Spleen and adrenal glands within normal limits. Fatty atrophy of the pancreas noted. Left kidney within normal  limits without evidence of nephrolithiasis or hydronephrosis. No radiopaque calculi seen along the course of the left renal collecting system. There is no left-sided hydroureter. On the right, there is an obstructive 7 mm stone lodged within the mid right ureter with secondary moderate right hydroureteronephrosis. Prominent right perinephric and periureteral fat stranding identified. Inflammatory stranding extends anteriorly towards the second and third portions of the duodenum. Possible additional 5 mm stone within the upper pole right kidney. No other stones along the course of the right ureter. Small hiatal hernia present. Stomach within normal limits. Again, there is inflammatory stranding about the duodenum. This is felt to be related to inflammatory changes from the right kidney rather than acute duodenitis. No evidence for bowel obstruction. No other acute inflammatory changes about the  bowels. Sigmoid diverticulosis without evidence for acute diverticulitis. Appendix is normal. Bladder within normal limits.  Prostate normal. No free air or fluid. No pathologically enlarged intra-abdominal or pelvic lymph nodes. Extensive atheromatous plaque throughout the intra-abdominal aorta. No aneurysm. A fatty atrophy of the left psoas muscle noted. No acute osseous abnormality. No worrisome lytic or blastic osseous lesions. Patient is status post spinal is a fusion at L4-5. IMPRESSION: 1. Obstructive 7 mm stone lodged within the mid right ureter with secondary moderate right hydroureteronephrosis and prominent periureteral and perinephric fat stranding. 2. No other acute intra-abdominal or pelvic process. 3. Colonic diverticulosis without evidence for acute diverticulitis. 4. Cardiomegaly with advanced atheromatous plaque throughout the intra-abdominal aorta. No aneurysm. Electronically Signed   By: Rise MuBenjamin  McClintock M.D.   On: 03/22/2016 06:12   Dg Chest 2 View  03/22/2016  CLINICAL DATA:  Hypoxia this morning EXAM: CHEST  2 VIEW COMPARISON:  11/21/2015 FINDINGS: Diffuse interstitial opacity. Chronic cardiomegaly. Stable aortic and hilar contours. The patient is status post CABG. No pleural effusion or air leak. IMPRESSION: CHF. Electronically Signed   By: Marnee SpringJonathon  Watts M.D.   On: 03/22/2016 06:36   Dg C-arm 1-60 Min-no Report  03/22/2016  CLINICAL DATA: stent placement rt ureteral stone C-ARM 1-60 MINUTES Fluoroscopy was utilized by the requesting physician.  No radiographic interpretation.        Scheduled Meds: . aspirin EC  81 mg Oral QPM  . cefTRIAXone (ROCEPHIN)  IV  1 g Intravenous Q24H  . Chlorhexidine Gluconate Cloth  6 each Topical Q0600  . dutasteride  0.5 mg Oral QPM  . furosemide  20 mg Oral Daily  . gabapentin  100 mg Oral QHS  . insulin aspart  0-15 Units Subcutaneous TID WC  . insulin aspart  0-5 Units Subcutaneous QHS  . mirabegron ER  50 mg Oral QPM  .  pantoprazole  40 mg Oral Daily  . pindolol  2.5 mg Oral Daily  . sodium chloride flush  3 mL Intravenous Q12H   Continuous Infusions:    LOS: 1 day    Time spent: 35min    Zannie CovePreetha Ashan Cueva, MD [facility] Pager [phone]  If 7PM-7AM, please contact night-coverage [email or web address] Password [email or web address] 03/23/2016, 5:43 PM

## 2016-03-24 DIAGNOSIS — E875 Hyperkalemia: Secondary | ICD-10-CM

## 2016-03-24 DIAGNOSIS — I482 Chronic atrial fibrillation: Secondary | ICD-10-CM

## 2016-03-24 DIAGNOSIS — E1122 Type 2 diabetes mellitus with diabetic chronic kidney disease: Secondary | ICD-10-CM

## 2016-03-24 DIAGNOSIS — E118 Type 2 diabetes mellitus with unspecified complications: Secondary | ICD-10-CM

## 2016-03-24 DIAGNOSIS — I5032 Chronic diastolic (congestive) heart failure: Secondary | ICD-10-CM

## 2016-03-24 DIAGNOSIS — N200 Calculus of kidney: Secondary | ICD-10-CM

## 2016-03-24 DIAGNOSIS — N183 Chronic kidney disease, stage 3 (moderate): Secondary | ICD-10-CM

## 2016-03-24 LAB — URINE CULTURE

## 2016-03-24 LAB — CBC
HCT: 29.5 % — ABNORMAL LOW (ref 39.0–52.0)
Hemoglobin: 10.2 g/dL — ABNORMAL LOW (ref 13.0–17.0)
MCH: 32.5 pg (ref 26.0–34.0)
MCHC: 34.6 g/dL (ref 30.0–36.0)
MCV: 93.9 fL (ref 78.0–100.0)
PLATELETS: 131 10*3/uL — AB (ref 150–400)
RBC: 3.14 MIL/uL — AB (ref 4.22–5.81)
RDW: 15.3 % (ref 11.5–15.5)
WBC: 14.9 10*3/uL — ABNORMAL HIGH (ref 4.0–10.5)

## 2016-03-24 LAB — BASIC METABOLIC PANEL
Anion gap: 10 (ref 5–15)
BUN: 44 mg/dL — AB (ref 6–20)
CALCIUM: 8.4 mg/dL — AB (ref 8.9–10.3)
CO2: 22 mmol/L (ref 22–32)
CREATININE: 1.86 mg/dL — AB (ref 0.61–1.24)
Chloride: 104 mmol/L (ref 101–111)
GFR calc Af Amer: 34 mL/min — ABNORMAL LOW (ref >60)
GFR, EST NON AFRICAN AMERICAN: 29 mL/min — AB (ref >60)
GLUCOSE: 110 mg/dL — AB (ref 65–99)
Potassium: 4 mmol/L (ref 3.5–5.1)
SODIUM: 136 mmol/L (ref 135–145)

## 2016-03-24 LAB — GLUCOSE, CAPILLARY
GLUCOSE-CAPILLARY: 121 mg/dL — AB (ref 65–99)
Glucose-Capillary: 181 mg/dL — ABNORMAL HIGH (ref 65–99)

## 2016-03-24 MED ORDER — CHLORHEXIDINE GLUCONATE CLOTH 2 % EX PADS: 6.0000 | MEDICATED_PAD | Freq: Every day | CUTANEOUS | Status: DC

## 2016-03-24 MED ORDER — CEPHALEXIN 500 MG PO CAPS: 500.0000 mg | ORAL_CAPSULE | Freq: Two times a day (BID) | ORAL | Status: AC

## 2016-03-24 MED ORDER — HYDROCODONE-ACETAMINOPHEN 5-325 MG PO TABS: 1.0000 | ORAL_TABLET | ORAL | Status: DC | PRN

## 2016-03-24 NOTE — Discharge Summary (Signed)
Physician Discharge Summary  [name] ZOX:[id number] DOB: 12/14/1917 DOA: 03/22/2016  PCP: Kyle MelenaZack Hall, MD  Admit date: 03/22/2016 Discharge date: 03/24/2016  Time spent: 25* minutes  Recommendations for Outpatient Follow-up:  1. Follow up Urology Dr [name] in 2 weeks   Discharge Diagnoses:  Active Problems:   Diabetes mellitus type 2 with complications (HCC)   ATRIAL FIBRILLATION   GERD (gastroesophageal reflux disease)   Kidney stone   Hydroureteronephrosis   UTI (lower urinary tract infection)   Hyperkalemia   CKD stage 3 secondary to diabetes (HCC)   Chronic diastolic CHF (congestive heart failure) (HCC)   AKI (acute kidney injury) (HCC)   Discharge Condition: Stable  Diet recommendation: low salt diet  Filed Weights   03/22/16 0451 03/22/16 0928 03/22/16 1455  Weight: 83.008 kg (183 lb) 82.4 kg (181 lb 10.5 oz) 82.101 kg (181 lb)    History of present illness:  82 y.o. male with a hx of CAD s/p CABG, DM type 2, HLD, HTN, A-fib not on anticoagulation, diastolic CHF who presented to the ED with complaints of right-sided flank pain that radiated to his right lower abdomen that started at 10:00 pm 4/24 night. FOund to have complicated UTI with obstructing stone, Urology consulted, s/p Cysto and JJ stent 4/25  Hospital Course:  1. Complicated UTI with Obstructing right kidney stone. -Urology consult appreciated, s/p Cystoscopy and JJ stent placement last pm - patient was started on  ceftriaxone, urine culture grew proteus mirabilis, sensitive to cefazolin _ will discharge on Keflex 500 mg po bid for 14 days. Vicodin prn for pain Follow up Urology in 2 weeks for ureteral stone exctraction  3. A fib, chronic. Not a candidate for anticoagulation due to history of falls -rate controlled, continue on pindolol  4. AKI on CKD 3. Baseline creatinine 1.46, likely related to #1 and ARB -  Today cr is 1.82, will discontinue Valsartan  5. Chronic diastolic CHF. -  Clinically appears compensated, continue Lasix  6. Hyperkalemia. -, corrected, today potassium is 4.0  7. GERD. Continue PPI  8. Diabetes mellitus type 2. - Continue metformin and januvia  Procedures: s/p Cysto and JJ stent 4/25  Consultations:  Urology   Discharge Exam: Filed Vitals:   03/24/16 0653 03/24/16 0833  BP: 98/64 107/56  Pulse: 70 66  Temp: 98.1 F (36.7 C)   Resp: 20     General: Appear in no acute distress Cardiovascular: S1S2 RRR Respiratory: Clear bilaterally  Discharge Instructions   Discharge Instructions    Diet - low sodium heart healthy    Complete by:  As directed      Increase activity slowly    Complete by:  As directed           Current Discharge Medication List    START taking these medications   Details  cephALEXin (KEFLEX) 500 MG capsule Take 1 capsule (500 mg total) by mouth 2 (two) times daily. Qty: 28 capsule, Refills: 0    Chlorhexidine Gluconate Cloth 2 % PADS Apply 6 each topically daily at 6 (six) AM.    HYDROcodone-acetaminophen (NORCO/VICODIN) 5-325 MG tablet Take 1-2 tablets by mouth every 4 (four) hours as needed for moderate pain. Qty: 30 tablet, Refills: 0      CONTINUE these medications which have NOT CHANGED   Details  aspirin EC 81 MG tablet Take 81 mg by mouth every evening.     beta carotene w/minerals (OCUVITE) tablet Take 1 tablet by mouth every evening.  dutasteride (AVODART) 0.5 MG capsule Take 0.5 mg by mouth every evening.     furosemide (LASIX) 20 MG tablet Take 1 tablet (20 mg total) by mouth daily. Qty: 10 tablet, Refills: 0    gabapentin (NEURONTIN) 100 MG capsule Take 100 mg by mouth at bedtime.    metFORMIN (GLUCOPHAGE) 500 MG tablet Take 1 tablet by mouth daily.    mirabegron ER (MYRBETRIQ) 25 MG TB24 tablet Take 50 mg by mouth every evening. *Adminstered in the evening*    omeprazole (PRILOSEC) 40 MG capsule Take 40 mg by mouth daily.    pindolol (VISKEN) 5 MG tablet Take 2.5  mg by mouth daily.     sitaGLIPtin (JANUVIA) 100 MG tablet Take 100 mg by mouth every evening.       STOP taking these medications     ibuprofen (ADVIL,MOTRIN) 200 MG tablet      valsartan (DIOVAN) 160 MG tablet        Allergies  Allergen Reactions  . Acetaminophen-Codeine     Unknown reaction-not listed on current MAR  . Advil [Ibuprofen] Other (See Comments)    Unknown reaction  . Penicillins     Unknown reaction-not listed on current MAR      The results of significant diagnostics from this hospitalization (including imaging, microbiology, ancillary and laboratory) are listed below for reference.    Significant Diagnostic Studies: Ct Abdomen Pelvis Wo Contrast  03/22/2016  CLINICAL DATA:  Initial evaluation for acute right flank pain. EXAM: CT ABDOMEN AND PELVIS WITHOUT CONTRAST TECHNIQUE: Multidetector CT imaging of the abdomen and pelvis was performed following the standard protocol without IV contrast. COMPARISON:  Prior study from 01/29/2012. FINDINGS: Patchy bibasilar linear opacities favored to reflect atelectasis. No consolidative opacity identified. Cardiomegaly partially visualized. No significant pleural or pericardial fusion. Mitral valvular calcifications noted. Limited noncontrast evaluation of the liver is unremarkable. Gallbladder is absent. No biliary dilatation. Spleen and adrenal glands within normal limits. Fatty atrophy of the pancreas noted. Left kidney within normal limits without evidence of nephrolithiasis or hydronephrosis. No radiopaque calculi seen along the course of the left renal collecting system. There is no left-sided hydroureter. On the right, there is an obstructive 7 mm stone lodged within the mid right ureter with secondary moderate right hydroureteronephrosis. Prominent right perinephric and periureteral fat stranding identified. Inflammatory stranding extends anteriorly towards the second and third portions of the duodenum. Possible additional 5  mm stone within the upper pole right kidney. No other stones along the course of the right ureter. Small hiatal hernia present. Stomach within normal limits. Again, there is inflammatory stranding about the duodenum. This is felt to be related to inflammatory changes from the right kidney rather than acute duodenitis. No evidence for bowel obstruction. No other acute inflammatory changes about the bowels. Sigmoid diverticulosis without evidence for acute diverticulitis. Appendix is normal. Bladder within normal limits.  Prostate normal. No free air or fluid. No pathologically enlarged intra-abdominal or pelvic lymph nodes. Extensive atheromatous plaque throughout the intra-abdominal aorta. No aneurysm. A fatty atrophy of the left psoas muscle noted. No acute osseous abnormality. No worrisome lytic or blastic osseous lesions. Patient is status post spinal is a fusion at L4-5. IMPRESSION: 1. Obstructive 7 mm stone lodged within the mid right ureter with secondary moderate right hydroureteronephrosis and prominent periureteral and perinephric fat stranding. 2. No other acute intra-abdominal or pelvic process. 3. Colonic diverticulosis without evidence for acute diverticulitis. 4. Cardiomegaly with advanced atheromatous plaque throughout the intra-abdominal aorta. No aneurysm.  Electronically Signed   By: Rise Mu M.D.   On: 03/22/2016 06:12   Dg Chest 2 View  03/22/2016  CLINICAL DATA:  Hypoxia this morning EXAM: CHEST  2 VIEW COMPARISON:  11/21/2015 FINDINGS: Diffuse interstitial opacity. Chronic cardiomegaly. Stable aortic and hilar contours. The patient is status post CABG. No pleural effusion or air leak. IMPRESSION: CHF. Electronically Signed   By: Marnee Spring M.D.   On: 03/22/2016 06:36   Dg C-arm 1-60 Min-no Report  03/22/2016  CLINICAL DATA: stent placement rt ureteral stone C-ARM 1-60 MINUTES Fluoroscopy was utilized by the requesting physician.  No radiographic interpretation.     Microbiology: Recent Results (from the past 240 hour(s))  Urine culture     Status: Abnormal   Collection Time: 03/22/16  6:15 AM  Result Value Ref Range Status   Specimen Description URINE, CLEAN CATCH  Final   Special Requests NONE  Final   Culture 40,000 COLONIES/mL PROTEUS MIRABILIS (A)  Final   Report Status 03/24/2016 FINAL  Final   Organism ID, Bacteria PROTEUS MIRABILIS (A)  Final      Susceptibility   Proteus mirabilis - MIC*    AMPICILLIN >=32 RESISTANT Resistant     CEFAZOLIN <=4 SENSITIVE Sensitive     CEFTRIAXONE <=1 SENSITIVE Sensitive     CIPROFLOXACIN <=0.25 SENSITIVE Sensitive     GENTAMICIN <=1 SENSITIVE Sensitive     IMIPENEM 1 SENSITIVE Sensitive     NITROFURANTOIN 128 RESISTANT Resistant     TRIMETH/SULFA <=20 SENSITIVE Sensitive     AMPICILLIN/SULBACTAM <=2 SENSITIVE Sensitive     PIP/TAZO <=4 SENSITIVE Sensitive     * 40,000 COLONIES/mL PROTEUS MIRABILIS  Surgical pcr screen     Status: Abnormal   Collection Time: 03/22/16  9:50 AM  Result Value Ref Range Status   MRSA, PCR NEGATIVE NEGATIVE Final   Staphylococcus aureus POSITIVE (A) NEGATIVE Final    Comment:        The Xpert SA Assay (FDA approved for NASAL specimens in patients over 18 years of age), is one component of a comprehensive surveillance program.  Test performance has been validated by [facility] for patients greater than or equal to 71 year old. It is not intended to diagnose infection nor to guide or monitor treatment. RESULT CALLED TO, READ BACK BY AND VERIFIED WITH: SHREVE,P. AT 1600 ON 03/22/2016 BY BAUGHAM,M.      Labs: Basic Metabolic Panel:  Recent Labs Lab 03/22/16 0540 03/23/16 0504 03/24/16 0452  NA 137 138 136  K 5.2* 4.5 4.0  CL 104 107 104  CO2 20* 23 22  GLUCOSE 207* 143* 110*  BUN 38* 38* 44*  CREATININE 2.14* 1.82* 1.86*  CALCIUM 9.0 8.4* 8.4*   Liver Function Tests:  Recent Labs Lab 03/22/16 0540 03/23/16 0504  AST 26 21  ALT 15* 12*   ALKPHOS 77 57  BILITOT 0.7 0.9  PROT 7.9 6.3*  ALBUMIN 4.0 3.0*    Recent Labs Lab 03/22/16 0540  LIPASE 26   No results for input(s): AMMONIA in the last 168 hours. CBC:  Recent Labs Lab 03/22/16 0540 03/23/16 0504 03/24/16 0452  WBC 19.7* 17.0* 14.9*  NEUTROABS 16.1*  --   --   HGB 11.8* 10.0* 10.2*  HCT 35.0* 29.0* 29.5*  MCV 93.3 93.5 93.9  PLT 160 128* 131*   Cardiac Enzymes:  Recent Labs Lab 03/22/16 0540  TROPONINI 0.05*   BNP: BNP (last 3 results)  Recent Labs  11/20/15 0100 11/21/15  2105 03/22/16 0540  BNP 402.0* 433.0* 588.0*    ProBNP (last 3 results) No results for input(s): PROBNP in the last 8760 hours.  CBG:  Recent Labs Lab 03/23/16 0829 03/23/16 1141 03/23/16 1642 03/23/16 2214 03/24/16 0752  GLUCAP 137* 134* 128* 189* 121*       Signed:  Meredeth Ide MD.  [facility] 03/24/2016, 11:42 AM

## 2016-03-24 NOTE — NC FL2 (Signed)
[place] MEDICAID FL2 LEVEL OF CARE SCREENING TOOL     IDENTIFICATION  Patient Name: [name] Birthdate: 1918-03-20 Sex: male Admission Date (Current Location): 03/22/2016  [facility] [place]  [place]   Facility and Address:  [facility],  [address], [place] [id number]      Provider Number: [id number]  Attending Physician Name and Address:  Meredeth IdeGagan S Lama, MD  Relative Name and Phone Number:       Current Level of Care: Hospital Recommended Level of Care: Assisted Living Facility Prior Approval Number:    Date Approved/Denied:   PASRR Number:    Discharge Plan: Other (Comment) (ALF)    Current Diagnoses: Patient Active Problem List   Diagnosis Date Noted  . Kidney stone 03/22/2016  . Hydroureteronephrosis 03/22/2016  . UTI (lower urinary tract infection) 03/22/2016  . Hyperkalemia 03/22/2016  . CKD stage 3 secondary to diabetes (HCC) 03/22/2016  . Chronic diastolic CHF (congestive heart failure) (HCC) 03/22/2016  . AKI (acute kidney injury) (HCC) 03/22/2016  . Barrett's esophagus 10/10/2013  . GERD (gastroesophageal reflux disease) 10/10/2013  . Dysphagia, unspecified(787.20) 08/13/2013  . SPINAL STENOSIS 02/15/2010  . CORONARY ATHEROSCLEROSIS NATIVE CORONARY ARTERY 10/31/2008  . ATRIAL FIBRILLATION 10/31/2008  . SCIATICA 07/28/2008  . KNEE, ARTHRITIS, DEGEN./OSTEO 06/11/2008  . DERANGEMENT MENISCUS 06/11/2008  . KNEE PAIN, RIGHT 06/11/2008  . Diabetes mellitus type 2 with complications (HCC) 06/10/2008    Orientation RESPIRATION BLADDER Height & Weight     Self, Time, Situation, Place  Normal Continent Weight: 181 lb (82.101 kg) Height:  5\' 4"  (162.6 cm)  BEHAVIORAL SYMPTOMS/MOOD NEUROLOGICAL BOWEL NUTRITION STATUS  Other (Comment) (n/a)  (n/a) Continent Diet (low sodium heart healthy)  AMBULATORY STATUS COMMUNICATION OF NEEDS Skin   Limited Assist Verbally Surgical wounds                       Personal Care  Assistance Level of Assistance  Dressing, Feeding, Bathing Bathing Assistance: Limited assistance Feeding assistance: Limited assistance Dressing Assistance: Limited assistance     Functional Limitations Info  Sight, Hearing, Speech Sight Info: Adequate Hearing Info: Impaired Speech Info: Adequate    SPECIAL CARE FACTORS FREQUENCY  PT (By licensed PT)     PT Frequency: home health              Contractures Contractures Info: Not present    Additional Factors Info  Insulin Sliding Scale Code Status Info: DNR Allergies Info: Acetaminophen-codeine, Advil, Penicillins           Current Medications (03/24/2016):  This is the current hospital active medication list Current Facility-Administered Medications  Medication Dose Route Frequency Provider Last Rate Last Dose  . acetaminophen (TYLENOL) tablet 650 mg  650 mg Oral Q6H PRN Erick BlinksJehanzeb Memon, MD       Or  . acetaminophen (TYLENOL) suppository 650 mg  650 mg Rectal Q6H PRN Erick BlinksJehanzeb Memon, MD      . aspirin EC tablet 81 mg  81 mg Oral QPM Erick BlinksJehanzeb Memon, MD   81 mg at 03/23/16 1704  . calcium carbonate (TUMS - dosed in mg elemental calcium) chewable tablet 200 mg of elemental calcium  1 tablet Oral TID PRN Zannie CovePreetha Joseph, MD   200 mg of elemental calcium at 03/23/16 1723  . cefTRIAXone (ROCEPHIN) 1 g in dextrose 5 % 50 mL IVPB  1 g Intravenous Q24H Erick BlinksJehanzeb Memon, MD   1 g at 03/24/16 0821  . Chlorhexidine Gluconate Cloth  2 % PADS 6 each  6 each Topical Q0600 Erick Blinks, MD   6 each at 03/24/16 0600  . dutasteride (AVODART) capsule 0.5 mg  0.5 mg Oral QPM Erick Blinks, MD   0.5 mg at 03/23/16 1723  . furosemide (LASIX) tablet 20 mg  20 mg Oral Daily Erick Blinks, MD   20 mg at 03/24/16 0454  . gabapentin (NEURONTIN) capsule 100 mg  100 mg Oral QHS Erick Blinks, MD   100 mg at 03/23/16 2136  . HYDROcodone-acetaminophen (NORCO/VICODIN) 5-325 MG per tablet 1-2 tablet  1-2 tablet Oral Q4H PRN Erick Blinks, MD      .  insulin aspart (novoLOG) injection 0-15 Units  0-15 Units Subcutaneous TID WC Erick Blinks, MD   3 Units at 03/24/16 1227  . insulin aspart (novoLOG) injection 0-5 Units  0-5 Units Subcutaneous QHS Erick Blinks, MD      . mirabegron ER (MYRBETRIQ) tablet 50 mg  50 mg Oral QPM Erick Blinks, MD   50 mg at 03/23/16 1704  . ondansetron (ZOFRAN) tablet 4 mg  4 mg Oral Q6H PRN Erick Blinks, MD       Or  . ondansetron (ZOFRAN) injection 4 mg  4 mg Intravenous Q6H PRN Erick Blinks, MD      . pantoprazole (PROTONIX) EC tablet 40 mg  40 mg Oral Daily Erick Blinks, MD   40 mg at 03/24/16 0981  . pindolol (VISKEN) tablet 2.5 mg  2.5 mg Oral Daily Erick Blinks, MD   2.5 mg at 03/24/16 1914  . sodium chloride flush (NS) 0.9 % injection 3 mL  3 mL Intravenous Q12H Erick Blinks, MD   3 mL at 03/24/16 0822     Discharge Medications: Current Discharge Medication List    START taking these medications   Details  cephALEXin (KEFLEX) 500 MG capsule Take 1 capsule (500 mg total) by mouth 2 (two) times daily. Qty: 28 capsule, Refills: 0    Chlorhexidine Gluconate Cloth 2 % PADS Apply 6 each topically daily at 6 (six) AM.    HYDROcodone-acetaminophen (NORCO/VICODIN) 5-325 MG tablet Take 1-2 tablets by mouth every 4 (four) hours as needed for moderate pain. Qty: 30 tablet, Refills: 0      CONTINUE these medications which have NOT CHANGED   Details  aspirin EC 81 MG tablet Take 81 mg by mouth every evening.     beta carotene w/minerals (OCUVITE) tablet Take 1 tablet by mouth every evening.     dutasteride (AVODART) 0.5 MG capsule Take 0.5 mg by mouth every evening.     furosemide (LASIX) 20 MG tablet Take 1 tablet (20 mg total) by mouth daily. Qty: 10 tablet, Refills: 0    gabapentin (NEURONTIN) 100 MG capsule Take 100 mg by mouth at bedtime.    metFORMIN (GLUCOPHAGE) 500 MG tablet Take 1 tablet by mouth daily.    mirabegron ER (MYRBETRIQ) 25 MG TB24  tablet Take 50 mg by mouth every evening. *Adminstered in the evening*    omeprazole (PRILOSEC) 40 MG capsule Take 40 mg by mouth daily.    pindolol (VISKEN) 5 MG tablet Take 2.5 mg by mouth daily.     sitaGLIPtin (JANUVIA) 100 MG tablet Take 100 mg by mouth every evening.          Relevant Imaging Results:  Relevant Lab Results:   Additional Information SS#: [id number]. Right double j stent placement.   Derenda Fennel [place], [place] [phone]

## 2016-03-24 NOTE — Progress Notes (Signed)
[facility] per Dr. Sharl MaLama.  Pt's IV site D/C'd and WDL.  Pt's VSS.  [facility].  Pt ambulated off unit in stable condition.

## 2016-03-24 NOTE — Clinical Social Work Note (Signed)
[facility] d/[facility]. Pt and facility aware and agreeable. CSW left voicemail for pt's daughter. Facility to provide transport.   Derenda FennelKara Anaiyah Anglemyer, LCSW [phone]

## 2016-05-17 ENCOUNTER — Ambulatory Visit: Payer: Medicare Other | Admitting: Cardiology

## 2016-05-19 ENCOUNTER — Other Ambulatory Visit: Payer: Self-pay

## 2016-05-19 ENCOUNTER — Inpatient Hospital Stay (HOSPITAL_COMMUNITY)
Admission: EM | Admit: 2016-05-19 | Discharge: 2016-05-24 | DRG: 871 | Disposition: A | Discharge status: Skilled Nursing Facility | Payer: Medicare Other | Source: Skilled Nursing Facility | Attending: Internal Medicine | Admitting: Internal Medicine

## 2016-05-19 ENCOUNTER — Encounter (HOSPITAL_COMMUNITY): Payer: Self-pay | Admitting: *Deleted

## 2016-05-19 DIAGNOSIS — Z87891 Personal history of nicotine dependence: Secondary | ICD-10-CM

## 2016-05-19 DIAGNOSIS — K219 Gastro-esophageal reflux disease without esophagitis: Secondary | ICD-10-CM | POA: Diagnosis present

## 2016-05-19 DIAGNOSIS — I10 Essential (primary) hypertension: Secondary | ICD-10-CM

## 2016-05-19 DIAGNOSIS — Z95828 Presence of other vascular implants and grafts: Secondary | ICD-10-CM

## 2016-05-19 DIAGNOSIS — I2510 Atherosclerotic heart disease of native coronary artery without angina pectoris: Secondary | ICD-10-CM | POA: Diagnosis present

## 2016-05-19 DIAGNOSIS — E1122 Type 2 diabetes mellitus with diabetic chronic kidney disease: Secondary | ICD-10-CM | POA: Diagnosis present

## 2016-05-19 DIAGNOSIS — R252 Cramp and spasm: Secondary | ICD-10-CM | POA: Diagnosis not present

## 2016-05-19 DIAGNOSIS — E785 Hyperlipidemia, unspecified: Secondary | ICD-10-CM

## 2016-05-19 DIAGNOSIS — E872 Acidosis: Secondary | ICD-10-CM | POA: Diagnosis present

## 2016-05-19 DIAGNOSIS — R6521 Severe sepsis with septic shock: Secondary | ICD-10-CM | POA: Diagnosis present

## 2016-05-19 DIAGNOSIS — A419 Sepsis, unspecified organism: Secondary | ICD-10-CM

## 2016-05-19 DIAGNOSIS — Z7982 Long term (current) use of aspirin: Secondary | ICD-10-CM

## 2016-05-19 DIAGNOSIS — N200 Calculus of kidney: Secondary | ICD-10-CM

## 2016-05-19 DIAGNOSIS — R4182 Altered mental status, unspecified: Secondary | ICD-10-CM | POA: Diagnosis not present

## 2016-05-19 DIAGNOSIS — G4730 Sleep apnea, unspecified: Secondary | ICD-10-CM | POA: Diagnosis present

## 2016-05-19 DIAGNOSIS — R55 Syncope and collapse: Secondary | ICD-10-CM

## 2016-05-19 DIAGNOSIS — Z515 Encounter for palliative care: Secondary | ICD-10-CM

## 2016-05-19 DIAGNOSIS — N1330 Unspecified hydronephrosis: Secondary | ICD-10-CM | POA: Diagnosis present

## 2016-05-19 DIAGNOSIS — Z951 Presence of aortocoronary bypass graft: Secondary | ICD-10-CM

## 2016-05-19 DIAGNOSIS — M25551 Pain in right hip: Secondary | ICD-10-CM

## 2016-05-19 DIAGNOSIS — N132 Hydronephrosis with renal and ureteral calculous obstruction: Secondary | ICD-10-CM | POA: Diagnosis present

## 2016-05-19 DIAGNOSIS — N10 Acute tubulo-interstitial nephritis: Secondary | ICD-10-CM | POA: Diagnosis present

## 2016-05-19 DIAGNOSIS — A4101 Sepsis due to Methicillin susceptible Staphylococcus aureus: Principal | ICD-10-CM | POA: Diagnosis present

## 2016-05-19 DIAGNOSIS — Z880 Allergy status to penicillin: Secondary | ICD-10-CM

## 2016-05-19 DIAGNOSIS — E875 Hyperkalemia: Secondary | ICD-10-CM

## 2016-05-19 DIAGNOSIS — R7989 Other specified abnormal findings of blood chemistry: Secondary | ICD-10-CM

## 2016-05-19 DIAGNOSIS — L8990 Pressure ulcer of unspecified site, unspecified stage: Secondary | ICD-10-CM

## 2016-05-19 DIAGNOSIS — I4510 Unspecified right bundle-branch block: Secondary | ICD-10-CM | POA: Diagnosis present

## 2016-05-19 DIAGNOSIS — Z886 Allergy status to analgesic agent status: Secondary | ICD-10-CM

## 2016-05-19 DIAGNOSIS — Z87442 Personal history of urinary calculi: Secondary | ICD-10-CM

## 2016-05-19 DIAGNOSIS — Z7189 Other specified counseling: Secondary | ICD-10-CM

## 2016-05-19 DIAGNOSIS — D696 Thrombocytopenia, unspecified: Secondary | ICD-10-CM | POA: Diagnosis present

## 2016-05-19 DIAGNOSIS — I5033 Acute on chronic diastolic (congestive) heart failure: Secondary | ICD-10-CM | POA: Diagnosis present

## 2016-05-19 DIAGNOSIS — B964 Proteus (mirabilis) (morganii) as the cause of diseases classified elsewhere: Secondary | ICD-10-CM | POA: Diagnosis present

## 2016-05-19 DIAGNOSIS — N184 Chronic kidney disease, stage 4 (severe): Secondary | ICD-10-CM | POA: Diagnosis present

## 2016-05-19 DIAGNOSIS — Z7984 Long term (current) use of oral hypoglycemic drugs: Secondary | ICD-10-CM

## 2016-05-19 DIAGNOSIS — E118 Type 2 diabetes mellitus with unspecified complications: Secondary | ICD-10-CM | POA: Diagnosis present

## 2016-05-19 DIAGNOSIS — I5032 Chronic diastolic (congestive) heart failure: Secondary | ICD-10-CM

## 2016-05-19 DIAGNOSIS — J9601 Acute respiratory failure with hypoxia: Secondary | ICD-10-CM | POA: Diagnosis present

## 2016-05-19 DIAGNOSIS — D649 Anemia, unspecified: Secondary | ICD-10-CM

## 2016-05-19 DIAGNOSIS — Z66 Do not resuscitate: Secondary | ICD-10-CM | POA: Diagnosis present

## 2016-05-19 DIAGNOSIS — I482 Chronic atrial fibrillation, unspecified: Secondary | ICD-10-CM

## 2016-05-19 DIAGNOSIS — I130 Hypertensive heart and chronic kidney disease with heart failure and stage 1 through stage 4 chronic kidney disease, or unspecified chronic kidney disease: Secondary | ICD-10-CM | POA: Diagnosis present

## 2016-05-19 DIAGNOSIS — Z452 Encounter for adjustment and management of vascular access device: Secondary | ICD-10-CM

## 2016-05-19 DIAGNOSIS — I248 Other forms of acute ischemic heart disease: Secondary | ICD-10-CM | POA: Diagnosis present

## 2016-05-19 DIAGNOSIS — R778 Other specified abnormalities of plasma proteins: Secondary | ICD-10-CM

## 2016-05-19 DIAGNOSIS — H9190 Unspecified hearing loss, unspecified ear: Secondary | ICD-10-CM | POA: Diagnosis present

## 2016-05-19 DIAGNOSIS — D631 Anemia in chronic kidney disease: Secondary | ICD-10-CM | POA: Diagnosis present

## 2016-05-19 DIAGNOSIS — E782 Mixed hyperlipidemia: Secondary | ICD-10-CM | POA: Diagnosis present

## 2016-05-19 DIAGNOSIS — N179 Acute kidney failure, unspecified: Secondary | ICD-10-CM | POA: Diagnosis present

## 2016-05-19 HISTORY — DX: Unspecified hydronephrosis: N13.30

## 2016-05-19 HISTORY — DX: Chronic kidney disease, unspecified: N18.9

## 2016-05-19 MED ORDER — VANCOMYCIN HCL IN DEXTROSE 1-5 GM/200ML-% IV SOLN
1000.0000 mg | Freq: Once | INTRAVENOUS | Status: AC
  Administered 2016-05-20: 1000 mg via INTRAVENOUS
  Filled 2016-05-19: qty 200

## 2016-05-19 MED ORDER — DEXTROSE 5 % IV SOLN
2.0000 g | Freq: Once | INTRAVENOUS | Status: AC
  Administered 2016-05-20: 2 g via INTRAVENOUS
  Filled 2016-05-19: qty 2

## 2016-05-19 MED ORDER — LEVOFLOXACIN IN D5W 750 MG/150ML IV SOLN
750.0000 mg | Freq: Once | INTRAVENOUS | Status: AC
  Administered 2016-05-20: 750 mg via INTRAVENOUS
  Filled 2016-05-19: qty 150

## 2016-05-19 NOTE — ED Notes (Signed)
Pt was found beside his bed on the floor at [facility]. Earlier pt had c/o abdominal pain. Pt had several episodes of emesis just PTA to the ED.

## 2016-05-19 NOTE — ED Provider Notes (Addendum)
CSN: [id number]     Arrival date & time 05/19/16  2330 History  By signing my name below, I, Vista Minkobert Ross, attest that this documentation has been prepared under the direction and in the presence of Houston SirenPeter Le, MD. Electronically signed, Vista Minkobert Ross, ED Scribe. 05/19/2016. 11:53 PM.   Chief Complaint  Patient presents with  . Altered Mental Status   The history is provided by the EMS personnel. No language interpreter was used.    LEVEL 5 CAVEAT DUE TO ACUITY OF CONDITION. HPI Comments: Stormy CardGeorge W Eiland is a 82 y.o. male brought in by ambulance, who presents to the Emergency Department s/p a syncopal episode that occurred this evening. Per EMS, he was found unresponsive on the floor at [facility] with an oral temp of 102.5 and had several episodes of vomiting PTA. Family reports that he was complaining of abdominal pain earlier today.  Past Medical History  Diagnosis Date  . Coronary atherosclerosis of native coronary artery     Multivessel status post CABG  . Mixed hyperlipidemia   . Type 2 diabetes mellitus (HCC)   . Atrial fibrillation (HCC)   . Essential hypertension, benign   . Sleep apnea    Past Surgical History  Procedure Laterality Date  . Tonsillectomy    . Cholecystectomy    . Knee arthroscopy    . Hernia repair    . Coronary artery bypass graft  1992    LIMA to LAD and diagonal, SVG to RCA, SVG to OM  . Back surgery    . Esophagogastroduodenoscopy (egd) with esophageal dilation N/A 09/12/2013    Procedure: ESOPHAGOGASTRODUODENOSCOPY (EGD) WITH ESOPHAGEAL DILATION;  Surgeon: Malissa HippoNajeeb U Rehman, MD;  Location: [facility];  Service: Endoscopy;  Laterality: N/A;  . Cystoscopy w/ ureteral stent placement Right 03/22/2016    Procedure: CYSTOSCOPY WITH RIGHT RETROGRADE PYELOGRAM, RIGHT URETERAL STENT PLACEMENT;  Surgeon: Marcine MatarStephen Dahlstedt, MD;  Location: [facility];  Service: Urology;  Laterality: Right;   Family History  Problem Relation Age of Onset  . Diabetes      Social History  Substance Use Topics  . Smoking status: Former Smoker    Start date: 11/28/1944    Quit date: 11/29/1963  . Smokeless tobacco: Never Used  . Alcohol Use: No    Review of Systems  Unable to perform ROS: Acuity of condition (Acuity of condition)      Allergies  Acetaminophen-codeine; Advil; and Penicillins  Home Medications   Prior to Admission medications   Medication Sig Start Date End Date Taking? Authorizing Provider  aspirin EC 81 MG tablet Take 81 mg by mouth every evening.     Historical Provider, MD  beta carotene w/minerals (OCUVITE) tablet Take 1 tablet by mouth every evening.     Historical Provider, MD  Chlorhexidine Gluconate Cloth 2 % PADS Apply 6 each topically daily at 6 (six) AM. 03/24/16   Meredeth IdeGagan S Lama, MD  dutasteride (AVODART) 0.5 MG capsule Take 0.5 mg by mouth every evening.     Historical Provider, MD  furosemide (LASIX) 20 MG tablet Take 1 tablet (20 mg total) by mouth daily. 11/20/15   Kristen N Ward, DO  gabapentin (NEURONTIN) 100 MG capsule Take 100 mg by mouth at bedtime. 09/30/14   Historical Provider, MD  HYDROcodone-acetaminophen (NORCO/VICODIN) 5-325 MG tablet Take 1-2 tablets by mouth every 4 (four) hours as needed for moderate pain. 03/24/16   Meredeth IdeGagan S Lama, MD  metFORMIN (GLUCOPHAGE) 500 MG tablet Take 1 tablet by mouth  daily. 04/28/14   Historical Provider, MD  mirabegron ER (MYRBETRIQ) 25 MG TB24 tablet Take 50 mg by mouth every evening. *Adminstered in the evening*    Historical Provider, MD  omeprazole (PRILOSEC) 40 MG capsule Take 40 mg by mouth daily.    Historical Provider, MD  pindolol (VISKEN) 5 MG tablet Take 2.5 mg by mouth daily.  04/22/14   Historical Provider, MD  sitaGLIPtin (JANUVIA) 100 MG tablet Take 100 mg by mouth every evening.     Historical Provider, MD   BP 89/50 mmHg  Pulse 108  Temp(Src) 100.6 F (38.1 C) (Core (Comment))  Resp 23  Ht 5\' 4"  (1.626 m)  Wt 181 lb (82.101 kg)  BMI 31.05 kg/m2  SpO2  98% Physical Exam  Constitutional: He appears well-developed and well-nourished. No distress.  HENT:  Head: Normocephalic and atraumatic.  Right Ear: Hearing normal.  Left Ear: Hearing normal.  Nose: Nose normal.  Mouth/Throat: Oropharynx is clear and moist and mucous membranes are normal.  Eyes: Conjunctivae and EOM are normal. Pupils are equal, round, and reactive to light.  Neck: Normal range of motion. Neck supple.  Cardiovascular: Regular rhythm, S1 normal and S2 normal.  Exam reveals no gallop and no friction rub.   No murmur heard. Irregularly irregular rhythm. Tachycardic   Pulmonary/Chest: Effort normal and breath sounds normal. No respiratory distress. He exhibits no tenderness.  Abdominal: Soft. Normal appearance. He exhibits distension. There is no hepatosplenomegaly. There is tenderness. There is no rebound, no guarding, no tenderness at McBurney's point and negative Murphy's sign. No hernia.  Slight distention with decreased bowel sounds.  Musculoskeletal: Normal range of motion. He exhibits edema.  Trace edema   Neurological: He has normal strength. No cranial nerve deficit or sensory deficit. Coordination normal. GCS eye subscore is 3. GCS verbal subscore is 1. GCS motor subscore is 5.  Disoriented   Skin: Skin is warm, dry and intact. No rash noted. No cyanosis.  Psychiatric: He is noncommunicative.  Nursing note and vitals reviewed.   ED Course  .Central Line Date/Time: 05/20/2016 4:33 AM Performed by: Gilda Crease Authorized by: Gilda Crease Consent: The procedure was performed in an emergent situation. Risks and benefits: risks, benefits and alternatives were discussed Required items: required blood products, implants, devices, and special equipment available Patient identity confirmed: arm band, hospital-assigned identification number and verbally with patient Time out: Immediately prior to procedure a "time out" was called to verify the  correct patient, procedure, equipment, support staff and site/side marked as required. Indications: vascular access and central pressure monitoring Anesthesia: local infiltration Local anesthetic: lidocaine 1% without epinephrine Anesthetic total: 3 ml Patient sedated: no Preparation: skin prepped with 2% chlorhexidine Skin prep agent dried: skin prep agent completely dried prior to procedure Sterile barriers: all five maximum sterile barriers used - cap, mask, sterile gown, sterile gloves, and large sterile sheet Hand hygiene: hand hygiene performed prior to central venous catheter insertion Location details: right internal jugular Patient position: Trendelenburg Catheter type: triple lumen Catheter size: 7 Fr Pre-procedure: landmarks identified Ultrasound guidance: yes Sterile ultrasound techniques: sterile gel and sterile probe covers were used Number of attempts: 1 Successful placement: yes Post-procedure: line sutured Assessment: blood return through all ports,  free fluid flow,  placement verified by x-ray and no pneumothorax on x-ray Patient tolerance: Patient tolerated the procedure well with no immediate complications    DIAGNOSTIC STUDIES: Oxygen Saturation is 86% on RA, low by my interpretation.  COORDINATION OF CARE: 11:51  PM-Will monitor, order blood work, imaging. Discussed treatment plan with pt at bedside and pt agreed to plan.   Labs Review Labs Reviewed  COMPREHENSIVE METABOLIC PANEL - Abnormal; Notable for the following:    Glucose, Bld 202 (*)    BUN 31 (*)    Creatinine, Ser 1.61 (*)    ALT 15 (*)    GFR calc non Af Amer 34 (*)    GFR calc Af Amer 40 (*)    All other components within normal limits  CBC WITH DIFFERENTIAL/PLATELET - Abnormal; Notable for the following:    WBC 11.7 (*)    RBC 3.86 (*)    Hemoglobin 12.5 (*)    HCT 37.3 (*)    Platelets 111 (*)    Neutro Abs 10.8 (*)    All other components within normal limits  URINALYSIS, ROUTINE W  REFLEX MICROSCOPIC (NOT AT [facility]) - Abnormal; Notable for the following:    Hgb urine dipstick MODERATE (*)    Protein, ur 30 (*)    Nitrite POSITIVE (*)    Leukocytes, UA MODERATE (*)    All other components within normal limits  TROPONIN I - Abnormal; Notable for the following:    Troponin I 0.07 (*)    All other components within normal limits  BRAIN NATRIURETIC PEPTIDE - Abnormal; Notable for the following:    B Natriuretic Peptide 709.0 (*)    All other components within normal limits  URINE MICROSCOPIC-ADD ON - Abnormal; Notable for the following:    Squamous Epithelial / LPF 0-5 (*)    Bacteria, UA MANY (*)    All other components within normal limits  I-STAT CG4 LACTIC ACID, ED - Abnormal; Notable for the following:    Lactic Acid, Venous 2.62 (*)    All other components within normal limits  I-STAT CG4 LACTIC ACID, ED - Abnormal; Notable for the following:    Lactic Acid, Venous 3.42 (*)    All other components within normal limits  CULTURE, BLOOD (ROUTINE X 2)  CULTURE, BLOOD (ROUTINE X 2)  BLOOD GAS, ARTERIAL  I-STAT CG4 LACTIC ACID, ED    Imaging Review Ct Head Wo Contrast  05/20/2016  CLINICAL DATA:  Acute onset of altered mental status. Status post syncope. Patient unresponsive. Fever and vomiting. Initial encounter. EXAM: CT HEAD WITHOUT CONTRAST CT CERVICAL SPINE WITHOUT CONTRAST TECHNIQUE: Multidetector CT imaging of the head and cervical spine was performed following the standard protocol without intravenous contrast. Multiplanar CT image reconstructions of the cervical spine were also generated. COMPARISON:  CT of the head performed 09/28/2014 FINDINGS: CT HEAD FINDINGS There is no evidence of acute infarction, mass lesion, or intra- or extra-axial hemorrhage on CT. Prominence of ventricles and sulci reflects moderate cortical volume loss. Mild cerebellar atrophy is noted. Scattered periventricular and subcortical white matter change likely reflects small vessel  ischemic microangiopathy. A chronic infarct is noted at the medial left occipital lobe, with associated encephalomalacia. The brainstem and fourth ventricle are within normal limits. The basal ganglia are unremarkable in appearance. No mass effect or midline shift is seen. There is no evidence of fracture; visualized osseous structures are unremarkable in appearance. The orbits are within normal limits. There is mild partial opacification of the right mastoid air cells. The paranasal sinuses and left mastoid air cells are well-aerated. No significant soft tissue abnormalities are seen. CT CERVICAL SPINE FINDINGS There is no evidence of acute fracture or subluxation. Vertebral bodies demonstrate normal height. There is minimal grade 1  anterolisthesis of C3 on C4, and minimal grade 1 anterolisthesis of C6 on C7. Multilevel disc space narrowing is noted along the mid cervical spine, with anterior and posterior disc osteophyte complexes. Underlying facet disease is noted along the cervical spine. Prevertebral soft tissues are within normal limits. A 4.2 cm hypodensity is suggested at the left thyroid lobe. Mild interstitial prominence is noted at the lung apices. Scattered calcification is seen at the carotid bifurcations bilaterally. IMPRESSION: 1. No evidence of traumatic intracranial injury or fracture. 2. No evidence of acute fracture or subluxation along the cervical spine. 3. Moderate cortical volume loss and scattered small vessel ischemic microangiopathy. 4. 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 5. Degenerative change along the cervical spine. 6. 4.2 cm hypodensity suggested at the left thyroid lobe. Consider further evaluation with thyroid ultrasound, as deemed clinically appropriate. If patient is clinically hyperthyroid, consider nuclear medicine thyroid uptake and scan. 7. Mild interstitial prominence at the lung apices. 8. Scattered calcification at the carotid  bifurcations bilaterally. Carotid ultrasound would be helpful for further evaluation, when and as deemed clinically appropriate. 9. Mild partial opacification of the right mastoid air cells. Electronically Signed   By: Roanna RaiderJeffery  Chang M.D.   On: 05/20/2016 02:18   Ct Cervical Spine Wo Contrast  05/20/2016  CLINICAL DATA:  Acute onset of altered mental status. Status post syncope. Patient unresponsive. Fever and vomiting. Initial encounter. EXAM: CT HEAD WITHOUT CONTRAST CT CERVICAL SPINE WITHOUT CONTRAST TECHNIQUE: Multidetector CT imaging of the head and cervical spine was performed following the standard protocol without intravenous contrast. Multiplanar CT image reconstructions of the cervical spine were also generated. COMPARISON:  CT of the head performed 09/28/2014 FINDINGS: CT HEAD FINDINGS There is no evidence of acute infarction, mass lesion, or intra- or extra-axial hemorrhage on CT. Prominence of ventricles and sulci reflects moderate cortical volume loss. Mild cerebellar atrophy is noted. Scattered periventricular and subcortical white matter change likely reflects small vessel ischemic microangiopathy. A chronic infarct is noted at the medial left occipital lobe, with associated encephalomalacia. The brainstem and fourth ventricle are within normal limits. The basal ganglia are unremarkable in appearance. No mass effect or midline shift is seen. There is no evidence of fracture; visualized osseous structures are unremarkable in appearance. The orbits are within normal limits. There is mild partial opacification of the right mastoid air cells. The paranasal sinuses and left mastoid air cells are well-aerated. No significant soft tissue abnormalities are seen. CT CERVICAL SPINE FINDINGS There is no evidence of acute fracture or subluxation. Vertebral bodies demonstrate normal height. There is minimal grade 1 anterolisthesis of C3 on C4, and minimal grade 1 anterolisthesis of C6 on C7. Multilevel disc  space narrowing is noted along the mid cervical spine, with anterior and posterior disc osteophyte complexes. Underlying facet disease is noted along the cervical spine. Prevertebral soft tissues are within normal limits. A 4.2 cm hypodensity is suggested at the left thyroid lobe. Mild interstitial prominence is noted at the lung apices. Scattered calcification is seen at the carotid bifurcations bilaterally. IMPRESSION: 1. No evidence of traumatic intracranial injury or fracture. 2. No evidence of acute fracture or subluxation along the cervical spine. 3. Moderate cortical volume loss and scattered small vessel ischemic microangiopathy. 4. 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 5. Degenerative change along the cervical spine. 6. 4.2 cm hypodensity suggested at the left thyroid lobe. Consider further evaluation with thyroid ultrasound, as deemed clinically appropriate. If  patient is clinically hyperthyroid, consider nuclear medicine thyroid uptake and scan. 7. Mild interstitial prominence at the lung apices. 8. Scattered calcification at the carotid bifurcations bilaterally. Carotid ultrasound would be helpful for further evaluation, when and as deemed clinically appropriate. 9. Mild partial opacification of the right mastoid air cells. Electronically Signed   By: Roanna Raider M.D.   On: 05/20/2016 02:18   Ct Abdomen Pelvis W Contrast  05/20/2016  CLINICAL DATA:  82 year old male with altered mental status syncope. Patient complaining of abdominal pain. EXAM: CT ABDOMEN AND PELVIS WITH CONTRAST TECHNIQUE: Multidetector CT imaging of the abdomen and pelvis was performed using the standard protocol following bolus administration of intravenous contrast. CONTRAST:  ISOVUE-300 IOPAMIDOL (ISOVUE-300) INJECTION 61% COMPARISON:  Abdominal CT dated 03/22/2016 FINDINGS: Evaluation is limited due to streak artifact caused by patient's arms. Bibasilar linear atelectasis/  scarring. The visualized lung bases are otherwise clear. Mild cardiomegaly. Calcified mitral annulus. Coronary vascular calcification and CABG clips. No intra-abdominal free air or free fluid. Cholecystectomy. The liver, pancreas, spleen, adrenal glands appear unremarkable. There is a right ureteral pigtail stent with proximal tip in the right renal pelvis and distal end within the urinary bladder. There is mild fullness of the right renal pelvis with parent urothelial enhancement. Correlation with urinalysis recommended to exclude UTI. There is excreted contrast versus a 4 mm nonobstructing right renal upper pole calculus. A 3 mm nonobstructing left renal interpolar calculus noted. A 1.8 cm left renal interpolar hypodense lesion is not well characterized but likely represents a cyst. There is no hydronephrosis on the left. There is slight heterogeneous enhancement of the left kidney. Correlation with urinalysis recommended. The left ureter appears unremarkable. The urinary bladder is decompressed around a Foley catheter. There is sigmoid diverticulosis without active inflammatory changes. There is no evidence of bowel obstruction. Fluid within the distal esophagus likely related to gastroesophageal reflux. There is apparent diffuse thickening of the wall of the stomach likely related to underdistention. Gastritis is less likely but not excluded. Clinical correlation is recommended. Normal appendix. There is advanced aortoiliac atherosclerotic disease. The IVC appears unremarkable. No portal venous gas identified. There is no adenopathy. There is a small fat containing left inguinal hernia. Small fat containing umbilical hernia is also noted. The abdominal wall soft tissues are otherwise unremarkable. There is osteopenia with multilevel degenerative changes of the spine. Lower lumbar laminectomy and L4-L5 disc spacer and posterior fusion hardware noted. No acute fracture. IMPRESSION: Colonic diverticulosis. No  evidence of bowel obstruction. Normal appendix. Under distention of the stomach versus less likely gastritis. Clinical correlation is recommended. Interval placement of a right sided ureteral stent. There is mild fullness of the right renal pelvis with urothelial enhancement. Correlation with urinalysis recommended to exclude UTI. Excreted intravenous contrast versus a nonobstructing 4 mm right renal upper pole calculus. Electronically Signed   By: Elgie Collard M.D.   On: 05/20/2016 02:21   Dg Chest Port 1 View  05/20/2016  CLINICAL DATA:  82 year old male with shortness of breath EXAM: PORTABLE CHEST 1 VIEW COMPARISON:  Chest radiograph dated 03/22/2016 FINDINGS: Single portable view of the chest demonstrates mild cardiomegaly with increased central vascular prominence compatible with mild congestive changes. There is no focal consolidation, pleural effusion, or pneumothorax. Median sternotomy wires and CABG vascular clips. No acute osseous pathology. IMPRESSION: Mild cardiomegaly with mild congestion.  No focal consolidation. Electronically Signed   By: Elgie Collard M.D.   On: 05/20/2016 00:37   I have personally reviewed and  evaluated these images and lab results as part of my medical decision-making.   EKG Interpretation   Date/Time:  Thursday May 19 2016 23:44:03 EDT Ventricular Rate:  105 PR Interval:    QRS Duration: 147 QT Interval:  365 QTC Calculation: 483 R Axis:   -44 Text Interpretation:  Atrial fibrillation RBBB and LAFB Abnormal T,  consider ischemia, lateral leads No significant change since last tracing  Confirmed by Zekiah Caruth  MD, Madisan Bice [phone]) on 05/19/2016 11:58:37 PM      MDM   Final diagnoses:  Encounter for central line placement  Sepsis UTI  Patient presented to the ER for evaluation after being found on the floor next to his bed at skilled nursing facility. Patient had vomited several times. Upon arrival to the ER, patient is found to be febrile. He  was tachypnea And hypoxic. He he is in atrial fibrillation chronically, was mildly tachycardic. This was considered to be consistent with possible sepsis. Patient was administered IV antibiotics after cultures obtained. His initial lactate was 2.6 and blood pressure was hypertensive at arrival. Because of history of congestive heart failure and no signs of severe sepsis, IV fluids were only administered at 125 mL per hour, no bolus.  Source of sepsis is found to be urinary. Patient was hospitalized several months ago with a right ureteral stone and infection. He has stent still in place. This was discussed with on-call urology, they did not feel that the patient required removal of the stent. Treatment was to be simply antibiotics. They did ask that the patient have Foley catheter present to decompress urinary system.  Patient was monitored here in the ER and has had his blood pressure drop. His map has never gone below 67, however, this was considered to be consistent with worsening sepsis. Repeat lactate was 3.4. I did call the patient's daughter and power of attorney at home. CODE STATUS is unclear. Patient was reportedly DO NOT RESUSCITATE during his last hospital stay, however, he is listed as a full code at the nursing home. Daughter does want immediate interventions performed including IV fluids, pressors and possibly intubation. She is not willing to say note intubation at this time and therefore CODE STATUS will be FULL.  Patient given a small fluid bolus here, but he does have evidence of volume overload at arrival. Giving him 30 mL/kg will likely result in intubation and it is not clear if his family even wants intubation at this time. Daughter wanted to think about this and give a more firm answer in the morning. A central line was therefore placed in anticipation of possible need for pressors going forward.  Patient will be admitted by the hospitalist service.  I personally performed the  services described in this documentation, which was scribed in my presence. The recorded information has been reviewed and is accurate.     Gilda Crease, MD 05/20/16 [id number]  Gilda Crease, MD 05/20/16 [id number]

## 2016-05-20 ENCOUNTER — Emergency Department (HOSPITAL_COMMUNITY): Payer: Medicare Other

## 2016-05-20 ENCOUNTER — Encounter (HOSPITAL_COMMUNITY): Payer: Self-pay | Admitting: Internal Medicine

## 2016-05-20 ENCOUNTER — Inpatient Hospital Stay (HOSPITAL_COMMUNITY): Payer: Medicare Other

## 2016-05-20 DIAGNOSIS — N179 Acute kidney failure, unspecified: Secondary | ICD-10-CM | POA: Diagnosis present

## 2016-05-20 DIAGNOSIS — A419 Sepsis, unspecified organism: Secondary | ICD-10-CM | POA: Diagnosis present

## 2016-05-20 DIAGNOSIS — N10 Acute tubulo-interstitial nephritis: Secondary | ICD-10-CM | POA: Diagnosis present

## 2016-05-20 DIAGNOSIS — D696 Thrombocytopenia, unspecified: Secondary | ICD-10-CM | POA: Diagnosis present

## 2016-05-20 DIAGNOSIS — J9601 Acute respiratory failure with hypoxia: Secondary | ICD-10-CM | POA: Diagnosis present

## 2016-05-20 DIAGNOSIS — R55 Syncope and collapse: Secondary | ICD-10-CM | POA: Diagnosis present

## 2016-05-20 DIAGNOSIS — D631 Anemia in chronic kidney disease: Secondary | ICD-10-CM | POA: Diagnosis present

## 2016-05-20 DIAGNOSIS — R252 Cramp and spasm: Secondary | ICD-10-CM | POA: Diagnosis not present

## 2016-05-20 DIAGNOSIS — Z7189 Other specified counseling: Secondary | ICD-10-CM | POA: Diagnosis not present

## 2016-05-20 DIAGNOSIS — Z87442 Personal history of urinary calculi: Secondary | ICD-10-CM | POA: Diagnosis not present

## 2016-05-20 DIAGNOSIS — I5032 Chronic diastolic (congestive) heart failure: Secondary | ICD-10-CM | POA: Diagnosis not present

## 2016-05-20 DIAGNOSIS — N132 Hydronephrosis with renal and ureteral calculous obstruction: Secondary | ICD-10-CM | POA: Diagnosis present

## 2016-05-20 DIAGNOSIS — H9190 Unspecified hearing loss, unspecified ear: Secondary | ICD-10-CM | POA: Diagnosis present

## 2016-05-20 DIAGNOSIS — Z87891 Personal history of nicotine dependence: Secondary | ICD-10-CM | POA: Diagnosis not present

## 2016-05-20 DIAGNOSIS — K219 Gastro-esophageal reflux disease without esophagitis: Secondary | ICD-10-CM | POA: Diagnosis present

## 2016-05-20 DIAGNOSIS — I130 Hypertensive heart and chronic kidney disease with heart failure and stage 1 through stage 4 chronic kidney disease, or unspecified chronic kidney disease: Secondary | ICD-10-CM | POA: Diagnosis present

## 2016-05-20 DIAGNOSIS — I482 Chronic atrial fibrillation: Secondary | ICD-10-CM | POA: Diagnosis present

## 2016-05-20 DIAGNOSIS — L8990 Pressure ulcer of unspecified site, unspecified stage: Secondary | ICD-10-CM | POA: Diagnosis present

## 2016-05-20 DIAGNOSIS — E1122 Type 2 diabetes mellitus with diabetic chronic kidney disease: Secondary | ICD-10-CM | POA: Diagnosis present

## 2016-05-20 DIAGNOSIS — D649 Anemia, unspecified: Secondary | ICD-10-CM | POA: Diagnosis not present

## 2016-05-20 DIAGNOSIS — Z515 Encounter for palliative care: Secondary | ICD-10-CM | POA: Diagnosis not present

## 2016-05-20 DIAGNOSIS — R7881 Bacteremia: Secondary | ICD-10-CM | POA: Diagnosis not present

## 2016-05-20 DIAGNOSIS — A4101 Sepsis due to Methicillin susceptible Staphylococcus aureus: Secondary | ICD-10-CM | POA: Diagnosis present

## 2016-05-20 DIAGNOSIS — I4510 Unspecified right bundle-branch block: Secondary | ICD-10-CM | POA: Diagnosis present

## 2016-05-20 DIAGNOSIS — N390 Urinary tract infection, site not specified: Secondary | ICD-10-CM | POA: Diagnosis not present

## 2016-05-20 DIAGNOSIS — N131 Hydronephrosis with ureteral stricture, not elsewhere classified: Secondary | ICD-10-CM | POA: Diagnosis not present

## 2016-05-20 DIAGNOSIS — R7989 Other specified abnormal findings of blood chemistry: Secondary | ICD-10-CM

## 2016-05-20 DIAGNOSIS — N184 Chronic kidney disease, stage 4 (severe): Secondary | ICD-10-CM | POA: Diagnosis present

## 2016-05-20 DIAGNOSIS — E118 Type 2 diabetes mellitus with unspecified complications: Secondary | ICD-10-CM | POA: Diagnosis not present

## 2016-05-20 DIAGNOSIS — Z7984 Long term (current) use of oral hypoglycemic drugs: Secondary | ICD-10-CM | POA: Diagnosis not present

## 2016-05-20 DIAGNOSIS — G4730 Sleep apnea, unspecified: Secondary | ICD-10-CM | POA: Diagnosis present

## 2016-05-20 DIAGNOSIS — Z951 Presence of aortocoronary bypass graft: Secondary | ICD-10-CM | POA: Diagnosis not present

## 2016-05-20 DIAGNOSIS — Z66 Do not resuscitate: Secondary | ICD-10-CM | POA: Diagnosis present

## 2016-05-20 DIAGNOSIS — B964 Proteus (mirabilis) (morganii) as the cause of diseases classified elsewhere: Secondary | ICD-10-CM | POA: Diagnosis present

## 2016-05-20 DIAGNOSIS — R6521 Severe sepsis with septic shock: Secondary | ICD-10-CM | POA: Diagnosis present

## 2016-05-20 DIAGNOSIS — E782 Mixed hyperlipidemia: Secondary | ICD-10-CM | POA: Diagnosis present

## 2016-05-20 DIAGNOSIS — R778 Other specified abnormalities of plasma proteins: Secondary | ICD-10-CM | POA: Diagnosis present

## 2016-05-20 DIAGNOSIS — I248 Other forms of acute ischemic heart disease: Secondary | ICD-10-CM | POA: Diagnosis present

## 2016-05-20 DIAGNOSIS — R4182 Altered mental status, unspecified: Secondary | ICD-10-CM | POA: Diagnosis present

## 2016-05-20 DIAGNOSIS — Z880 Allergy status to penicillin: Secondary | ICD-10-CM | POA: Diagnosis not present

## 2016-05-20 DIAGNOSIS — N200 Calculus of kidney: Secondary | ICD-10-CM | POA: Diagnosis not present

## 2016-05-20 DIAGNOSIS — I2510 Atherosclerotic heart disease of native coronary artery without angina pectoris: Secondary | ICD-10-CM | POA: Diagnosis present

## 2016-05-20 DIAGNOSIS — I5033 Acute on chronic diastolic (congestive) heart failure: Secondary | ICD-10-CM | POA: Diagnosis present

## 2016-05-20 DIAGNOSIS — Z7982 Long term (current) use of aspirin: Secondary | ICD-10-CM | POA: Diagnosis not present

## 2016-05-20 DIAGNOSIS — Z886 Allergy status to analgesic agent status: Secondary | ICD-10-CM | POA: Diagnosis not present

## 2016-05-20 DIAGNOSIS — E872 Acidosis: Secondary | ICD-10-CM | POA: Diagnosis present

## 2016-05-20 LAB — BLOOD CULTURE ID PANEL (REFLEXED)
Acinetobacter baumannii: NOT DETECTED
CANDIDA ALBICANS: NOT DETECTED
CANDIDA PARAPSILOSIS: NOT DETECTED
CANDIDA TROPICALIS: NOT DETECTED
CARBAPENEM RESISTANCE: NOT DETECTED
Candida glabrata: NOT DETECTED
Candida krusei: NOT DETECTED
ENTEROBACTER CLOACAE COMPLEX: NOT DETECTED
ENTEROCOCCUS SPECIES: NOT DETECTED
Enterobacteriaceae species: NOT DETECTED
Escherichia coli: NOT DETECTED
HAEMOPHILUS INFLUENZAE: NOT DETECTED
KLEBSIELLA PNEUMONIAE: NOT DETECTED
Klebsiella oxytoca: NOT DETECTED
LISTERIA MONOCYTOGENES: NOT DETECTED
METHICILLIN RESISTANCE: NOT DETECTED
Neisseria meningitidis: NOT DETECTED
PROTEUS SPECIES: NOT DETECTED
Pseudomonas aeruginosa: NOT DETECTED
SERRATIA MARCESCENS: NOT DETECTED
STAPHYLOCOCCUS AUREUS BCID: DETECTED — AB
STREPTOCOCCUS PNEUMONIAE: NOT DETECTED
STREPTOCOCCUS PYOGENES: NOT DETECTED
Staphylococcus species: DETECTED — AB
Streptococcus agalactiae: NOT DETECTED
Streptococcus species: NOT DETECTED
VANCOMYCIN RESISTANCE: NOT DETECTED

## 2016-05-20 LAB — URINALYSIS, ROUTINE W REFLEX MICROSCOPIC
BILIRUBIN URINE: NEGATIVE
Glucose, UA: NEGATIVE mg/dL
Ketones, ur: NEGATIVE mg/dL
Nitrite: POSITIVE — AB
PROTEIN: 30 mg/dL — AB
SPECIFIC GRAVITY, URINE: 1.01 (ref 1.005–1.030)
pH: 7.5 (ref 5.0–8.0)

## 2016-05-20 LAB — CBC
HEMATOCRIT: 32.4 % — AB (ref 39.0–52.0)
HEMOGLOBIN: 10.8 g/dL — AB (ref 13.0–17.0)
MCH: 32.8 pg (ref 26.0–34.0)
MCHC: 33.3 g/dL (ref 30.0–36.0)
MCV: 98.5 fL (ref 78.0–100.0)
Platelets: 100 10*3/uL — ABNORMAL LOW (ref 150–400)
RBC: 3.29 MIL/uL — ABNORMAL LOW (ref 4.22–5.81)
RDW: 13.5 % (ref 11.5–15.5)
WBC: 27.2 10*3/uL — ABNORMAL HIGH (ref 4.0–10.5)

## 2016-05-20 LAB — COMPREHENSIVE METABOLIC PANEL
ALBUMIN: 3.3 g/dL — AB (ref 3.5–5.0)
ALBUMIN: 4.3 g/dL (ref 3.5–5.0)
ALK PHOS: 56 U/L (ref 38–126)
ALK PHOS: 72 U/L (ref 38–126)
ALT: 14 U/L — ABNORMAL LOW (ref 17–63)
ALT: 15 U/L — ABNORMAL LOW (ref 17–63)
ANION GAP: 10 (ref 5–15)
ANION GAP: 8 (ref 5–15)
AST: 26 U/L (ref 15–41)
AST: 28 U/L (ref 15–41)
BILIRUBIN TOTAL: 1 mg/dL (ref 0.3–1.2)
BUN: 31 mg/dL — AB (ref 6–20)
BUN: 32 mg/dL — AB (ref 6–20)
CALCIUM: 8 mg/dL — AB (ref 8.9–10.3)
CALCIUM: 9 mg/dL (ref 8.9–10.3)
CO2: 23 mmol/L (ref 22–32)
CO2: 24 mmol/L (ref 22–32)
Chloride: 102 mmol/L (ref 101–111)
Chloride: 102 mmol/L (ref 101–111)
Creatinine, Ser: 1.61 mg/dL — ABNORMAL HIGH (ref 0.61–1.24)
Creatinine, Ser: 1.64 mg/dL — ABNORMAL HIGH (ref 0.61–1.24)
GFR calc Af Amer: 39 mL/min — ABNORMAL LOW (ref >60)
GFR calc Af Amer: 40 mL/min — ABNORMAL LOW (ref >60)
GFR calc non Af Amer: 34 mL/min — ABNORMAL LOW (ref >60)
GFR calc non Af Amer: 34 mL/min — ABNORMAL LOW (ref >60)
GLUCOSE: 188 mg/dL — AB (ref 65–99)
GLUCOSE: 202 mg/dL — AB (ref 65–99)
POTASSIUM: 3.6 mmol/L (ref 3.5–5.1)
Potassium: 3.8 mmol/L (ref 3.5–5.1)
SODIUM: 134 mmol/L — AB (ref 135–145)
Sodium: 135 mmol/L (ref 135–145)
TOTAL PROTEIN: 8 g/dL (ref 6.5–8.1)
Total Bilirubin: 1.2 mg/dL (ref 0.3–1.2)
Total Protein: 6.4 g/dL — ABNORMAL LOW (ref 6.5–8.1)

## 2016-05-20 LAB — CBC WITH DIFFERENTIAL/PLATELET
BASOS PCT: 0 %
Basophils Absolute: 0 10*3/uL (ref 0.0–0.1)
EOS PCT: 1 %
Eosinophils Absolute: 0.1 10*3/uL (ref 0.0–0.7)
HCT: 37.3 % — ABNORMAL LOW (ref 39.0–52.0)
Hemoglobin: 12.5 g/dL — ABNORMAL LOW (ref 13.0–17.0)
Lymphocytes Relative: 6 %
Lymphs Abs: 0.7 10*3/uL (ref 0.7–4.0)
MCH: 32.4 pg (ref 26.0–34.0)
MCHC: 33.5 g/dL (ref 30.0–36.0)
MCV: 96.6 fL (ref 78.0–100.0)
MONO ABS: 0.1 10*3/uL (ref 0.1–1.0)
Monocytes Relative: 1 %
Neutro Abs: 10.8 10*3/uL — ABNORMAL HIGH (ref 1.7–7.7)
Neutrophils Relative %: 92 %
PLATELETS: 111 10*3/uL — AB (ref 150–400)
RBC: 3.86 MIL/uL — ABNORMAL LOW (ref 4.22–5.81)
RDW: 13.8 % (ref 11.5–15.5)
Smear Review: DECREASED
WBC: 11.7 10*3/uL — ABNORMAL HIGH (ref 4.0–10.5)

## 2016-05-20 LAB — BLOOD GAS, ARTERIAL
ACID-BASE DEFICIT: 1 mmol/L (ref 0.0–2.0)
BICARBONATE: 23.8 meq/L (ref 20.0–24.0)
Drawn by: 213101
O2 CONTENT: 1.5 L/min
O2 Saturation: 93.7 %
PATIENT TEMPERATURE: 39.7
PCO2 ART: 38.9 mmHg (ref 35.0–45.0)
TCO2: 15.6 mmol/L (ref 0–100)
pH, Arterial: 7.396 (ref 7.350–7.450)
pO2, Arterial: 84.9 mmHg (ref 80.0–100.0)

## 2016-05-20 LAB — URINE MICROSCOPIC-ADD ON

## 2016-05-20 LAB — GLUCOSE, CAPILLARY
GLUCOSE-CAPILLARY: 158 mg/dL — AB (ref 65–99)
Glucose-Capillary: 170 mg/dL — ABNORMAL HIGH (ref 65–99)
Glucose-Capillary: 183 mg/dL — ABNORMAL HIGH (ref 65–99)
Glucose-Capillary: 197 mg/dL — ABNORMAL HIGH (ref 65–99)
Glucose-Capillary: 135 mg/dL — ABNORMAL HIGH (ref 65–99)

## 2016-05-20 LAB — VITAMIN B12: Vitamin B-12: 260 pg/mL (ref 180–914)

## 2016-05-20 LAB — IRON AND TIBC
Iron: 9 ug/dL — ABNORMAL LOW (ref 45–182)
SATURATION RATIOS: 4 % — AB (ref 17.9–39.5)
TIBC: 203 ug/dL — ABNORMAL LOW (ref 250–450)
UIBC: 194 ug/dL

## 2016-05-20 LAB — BRAIN NATRIURETIC PEPTIDE: B NATRIURETIC PEPTIDE 5: 709 pg/mL — AB (ref 0.0–100.0)

## 2016-05-20 LAB — I-STAT CG4 LACTIC ACID, ED
Lactic Acid, Venous: 2.62 mmol/L (ref 0.5–2.0)
Lactic Acid, Venous: 3.42 mmol/L (ref 0.5–2.0)

## 2016-05-20 LAB — MRSA PCR SCREENING: MRSA BY PCR: NEGATIVE

## 2016-05-20 LAB — TROPONIN I: TROPONIN I: 0.07 ng/mL — AB (ref <0.031)

## 2016-05-20 LAB — TSH: TSH: 1.136 u[IU]/mL (ref 0.350–4.500)

## 2016-05-20 MED ORDER — HYDROCORTISONE NA SUCCINATE PF 100 MG IJ SOLR
50.0000 mg | Freq: Three times a day (TID) | INTRAMUSCULAR | Status: AC
  Administered 2016-05-20 (×3): 50 mg via INTRAVENOUS
  Filled 2016-05-20 (×3): qty 2

## 2016-05-20 MED ORDER — INSULIN ASPART 100 UNIT/ML ~~LOC~~ SOLN: 0.0000 [IU] | Freq: Every day | SUBCUTANEOUS | Status: DC

## 2016-05-20 MED ORDER — POTASSIUM CHLORIDE IN NACL 20-0.9 MEQ/L-% IV SOLN
INTRAVENOUS | Status: DC
  Administered 2016-05-20 – 2016-05-24 (×4): via INTRAVENOUS

## 2016-05-20 MED ORDER — DEXTROSE 5 % IV SOLN
1.0000 g | Freq: Three times a day (TID) | INTRAVENOUS | Status: DC
  Administered 2016-05-20 – 2016-05-21 (×4): 1 g via INTRAVENOUS
  Filled 2016-05-20 (×11): qty 1

## 2016-05-20 MED ORDER — ACETAMINOPHEN 650 MG RE SUPP: 650.0000 mg | Freq: Four times a day (QID) | RECTAL | Status: DC | PRN

## 2016-05-20 MED ORDER — SODIUM CHLORIDE 0.9 % IV BOLUS (SEPSIS)
500.0000 mL | Freq: Once | INTRAVENOUS | Status: AC
  Administered 2016-05-20: 500 mL via INTRAVENOUS

## 2016-05-20 MED ORDER — LEVOFLOXACIN IN D5W 500 MG/100ML IV SOLN: 500.0000 mg | INTRAVENOUS | Status: DC

## 2016-05-20 MED ORDER — PANTOPRAZOLE SODIUM 40 MG PO TBEC
40.0000 mg | DELAYED_RELEASE_TABLET | Freq: Every day | ORAL | Status: DC
  Administered 2016-05-20 – 2016-05-24 (×5): 40 mg via ORAL
  Filled 2016-05-20 (×5): qty 1

## 2016-05-20 MED ORDER — ACETAMINOPHEN 650 MG RE SUPP
650.0000 mg | Freq: Once | RECTAL | Status: AC
  Administered 2016-05-20: 650 mg via RECTAL
  Filled 2016-05-20: qty 1

## 2016-05-20 MED ORDER — PHENYLEPHRINE HCL 10 MG/ML IJ SOLN
INTRAMUSCULAR | Status: AC
  Filled 2016-05-20: qty 1

## 2016-05-20 MED ORDER — VANCOMYCIN HCL IN DEXTROSE 1-5 GM/200ML-% IV SOLN
1000.0000 mg | INTRAVENOUS | Status: DC
  Administered 2016-05-20: 1000 mg via INTRAVENOUS
  Filled 2016-05-20: qty 200

## 2016-05-20 MED ORDER — SODIUM CHLORIDE 0.9 % IV SOLN
Freq: Once | INTRAVENOUS | Status: AC
  Administered 2016-05-20: 01:00:00 via INTRAVENOUS

## 2016-05-20 MED ORDER — ACETAMINOPHEN 325 MG PO TABS
650.0000 mg | ORAL_TABLET | Freq: Four times a day (QID) | ORAL | Status: DC | PRN
  Administered 2016-05-20 – 2016-05-23 (×6): 650 mg via ORAL
  Filled 2016-05-20 (×6): qty 2

## 2016-05-20 MED ORDER — IOPAMIDOL (ISOVUE-300) INJECTION 61%
100.0000 mL | Freq: Once | INTRAVENOUS | Status: AC | PRN
  Administered 2016-05-20: 100 mL via INTRAVENOUS

## 2016-05-20 MED ORDER — ASPIRIN EC 81 MG PO TBEC
81.0000 mg | DELAYED_RELEASE_TABLET | Freq: Every evening | ORAL | Status: DC
  Administered 2016-05-20 – 2016-05-23 (×4): 81 mg via ORAL
  Filled 2016-05-20 (×4): qty 1

## 2016-05-20 MED ORDER — INSULIN ASPART 100 UNIT/ML ~~LOC~~ SOLN
0.0000 [IU] | SUBCUTANEOUS | Status: DC
  Administered 2016-05-20 (×2): 3 [IU] via SUBCUTANEOUS

## 2016-05-20 MED ORDER — INSULIN ASPART 100 UNIT/ML ~~LOC~~ SOLN
0.0000 [IU] | Freq: Three times a day (TID) | SUBCUTANEOUS | Status: DC
  Administered 2016-05-20 (×2): 3 [IU] via SUBCUTANEOUS
  Administered 2016-05-21 (×3): 2 [IU] via SUBCUTANEOUS
  Administered 2016-05-22: 3 [IU] via SUBCUTANEOUS
  Administered 2016-05-22 – 2016-05-23 (×3): 2 [IU] via SUBCUTANEOUS
  Administered 2016-05-23 – 2016-05-24 (×2): 3 [IU] via SUBCUTANEOUS
  Administered 2016-05-24: 2 [IU] via SUBCUTANEOUS

## 2016-05-20 MED ORDER — DUTASTERIDE 0.5 MG PO CAPS
0.5000 mg | ORAL_CAPSULE | Freq: Every evening | ORAL | Status: DC
  Administered 2016-05-20 – 2016-05-23 (×4): 0.5 mg via ORAL
  Filled 2016-05-20 (×5): qty 1

## 2016-05-20 MED ORDER — SODIUM CHLORIDE 0.9% FLUSH
3.0000 mL | Freq: Two times a day (BID) | INTRAVENOUS | Status: DC
  Administered 2016-05-20 – 2016-05-23 (×5): 3 mL via INTRAVENOUS

## 2016-05-20 MED ORDER — HEPARIN SODIUM (PORCINE) 5000 UNIT/ML IJ SOLN
5000.0000 [IU] | Freq: Three times a day (TID) | INTRAMUSCULAR | Status: DC
  Administered 2016-05-20 – 2016-05-21 (×4): 5000 [IU] via SUBCUTANEOUS
  Filled 2016-05-20 (×4): qty 1

## 2016-05-20 MED ORDER — PHENYLEPHRINE HCL 10 MG/ML IJ SOLN
0.0000 ug/min | INTRAVENOUS | Status: DC
  Administered 2016-05-20: 10 ug/min via INTRAVENOUS
  Filled 2016-05-20: qty 1

## 2016-05-20 NOTE — Clinical Social Work Note (Signed)
Clinical Social Work Assessment  Patient Details  Name: [name] MRN: [id number] Date of Birth: 30-Jul-1918  Date of referral:  05/20/16               Reason for consult:  Other (Comment Required) (From [facility] )                Permission sought to share information with:    Permission granted to share information::     Name::        Agency::     Relationship::     Contact Information:     Housing/Transportation Living arrangements for the past 2 months:  Assisted Living Facility Source of Information:  Patient, Facility Patient Interpreter Needed:  None Criminal Activity/Legal Involvement Pertinent to Current Situation/Hospitalization:  No - Comment as needed Significant Relationships:  Adult Children Lives with:  Facility Resident Do you feel safe going back to the place where you live?  Yes Need for family participation in patient care:  Yes (Comment)  Care giving concerns:  Facility resident.    Social Worker assessment / plan:  Patient advised that it would be three years in Oct. That he has [facility] a [facility]. He stated that he uses a rollator, and performs ADLs independently.  CSW spoke with Engineer, manufacturingCrystal and Marcelino DusterMichelle at [facility].  They confirmed patient's statements and advised that patient has a supportive family. They advised that patient can return to the facility at discharge.    Employment status:  Retired Database administratornsurance information:  Managed Medicare PT Recommendations:  Not assessed at this time Information / Referral to community resources:     Patient/Family's Response to care:  Patient is agreeable to return to facility.   Patient/Family's Understanding of and Emotional Response to Diagnosis, Current Treatment, and Prognosis:  Patient understands his diagnosis, treatment and prognosis.   Emotional Assessment Appearance:  Appears stated age Attitude/Demeanor/Rapport:   (Cooperative) Affect (typically observed):   Accepting Orientation:  Oriented to Self, Oriented to Place, Oriented to  Time, Oriented to Situation Alcohol / Substance use:  Not Applicable Psych involvement (Current and /or in the community):  No (Comment)  Discharge Needs  Concerns to be addressed:   (Return to [facility] at discharge) Readmission within the last 30 days:  No Current discharge risk:  None Barriers to Discharge:  No Barriers Identified   Annice NeedySettle, Dasia Guerrier D, LCSW 05/20/2016, 2:28 PM

## 2016-05-20 NOTE — Progress Notes (Addendum)
Patient is a 82 year old man with a history of right hydronephrosis, status post stent April 2017, chronic atrial fibrillation, chronic diastolic congestive heart failure, and diabetes mellitus, who presented to the ED after being found down and unresponsive at the ALF. Reportedly, he had several episodes of emesis. In the ED, he was found to be febrile, tachycardic, tachypnea, hypotensive, and hypoxic. His lab data were significant for a creatinine of 1.61, troponin I of 0.07, lactic acid of 262, BNP of 709, WBC of 11.7, and platelet count of 111. His urinalysis revealed too numerous to count WBCs. He was admitted for further evaluation and management of septic shock and UTI.   Patient was admitted by Dr. Nedra HaiLee this morning. Patient was briefly seen and examined. His chart was reviewed. Agree with current management with additions below.   -Patient is alert and request something to eat. Will try dysphagia 3 carbohydrate modified diet.  - Change SSI to qac/qhs -We'll try to titrate off Neo-Synephrine.  -We'll order TSH, vitamin B12, total iron, for further evaluation. Blood cultures and urine culture results are pending.

## 2016-05-20 NOTE — Care Management Important Message (Signed)
Important Message  Patient Details  Name: Stormy CardGeorge W Warman MRN: [id number] Date of Birth: Feb 18, 1918   Medicare Important Message Given:  Yes    Malcolm MetroChildress, Norely Schlick Demske, RN 05/20/2016, 12:51 PM

## 2016-05-20 NOTE — H&P (Signed)
History and Physical    [name] [id number] DOB: 02-14-1918 DOA: 05/19/2016  Referring MD/NP/PA: Gilda Creasehristopher J Pollina, MD PCP: [name], MD  Outpatient Specialists: none [facility]  Chief Complaint: Fall  HPI: Kyle CardGeorge W Boye is a 82 y.o. male with medical history significant of GERD, CHF, essential HTN, afib, HLD, CAD s/p CABG, and DM type 2, presents to the ED via EMS s/p syncopal episode which occurred earlier this night. Per EMS, he was found unresponsive on the floor at his SNF with and elevated oral temperature. He had several episodes of emesis prior to admission as well.   ED Course: While in the ED, patient was found to be febrile, tachycardic, tachypneic, hypotensive, and hypoxic. UA showed evidence for infection. Blood cultures were collected and sent for further testing. CT C-spine and head showed no evidence of acute abnormalities. Hospitalist was asked to admit for management of septic shock. EDP called his daughter and at this time, his code status was confirmed as full code.  Due to his hx of ureteral stent, EDP discussed with urology and it was recommended that he be treated with antibiotics without plan for stent removal.  His lactic acid was elevated to 3.8, and he progressively became more hypotensive, though maintaining alertness and able to answer questions appropriately.  His Cr was 1.6, normal LFTs, and WBC of 11K.    Review of Systems: As per HPI otherwise 10 point review of systems negative.    Past Medical History  Diagnosis Date  . Coronary atherosclerosis of native coronary artery     Multivessel status post CABG  . Mixed hyperlipidemia   . Type 2 diabetes mellitus (HCC)   . Atrial fibrillation (HCC)   . Essential hypertension, benign   . Sleep apnea     Past Surgical History  Procedure Laterality Date  . Tonsillectomy    . Cholecystectomy    . Knee arthroscopy    . Hernia repair    . Coronary artery  bypass graft  1992    LIMA to LAD and diagonal, SVG to RCA, SVG to OM  . Back surgery    . Esophagogastroduodenoscopy (egd) with esophageal dilation N/A 09/12/2013    Procedure: ESOPHAGOGASTRODUODENOSCOPY (EGD) WITH ESOPHAGEAL DILATION;  Surgeon: Malissa HippoNajeeb U Rehman, MD;  Location: [facility];  Service: Endoscopy;  Laterality: N/A;  . Cystoscopy w/ ureteral stent placement Right 03/22/2016    Procedure: CYSTOSCOPY WITH RIGHT RETROGRADE PYELOGRAM, RIGHT URETERAL STENT PLACEMENT;  Surgeon: Marcine MatarStephen Dahlstedt, MD;  Location: [facility] ORS;  Service: Urology;  Laterality: Right;     reports that he quit smoking about 52 years ago. He started smoking about 71 years ago. He has never used smokeless tobacco. He reports that he does not drink alcohol or use illicit drugs.  Allergies  Allergen Reactions  . Acetaminophen-Codeine     Unknown reaction-not listed on current MAR  . Advil [Ibuprofen] Other (See Comments)    Unknown reaction  . Penicillins     Unknown reaction-not listed on current MAR    Family History  Problem Relation Age of Onset  . Diabetes      Prior to Admission medications   Medication Sig Start Date End Date Taking? Authorizing Provider  aspirin EC 81 MG tablet Take 81 mg by mouth every evening.     Historical Provider, MD  beta carotene w/minerals (OCUVITE) tablet Take 1 tablet by mouth every evening.     Historical Provider, MD  Chlorhexidine  Gluconate Cloth 2 % PADS Apply 6 each topically daily at 6 (six) AM. 03/24/16   Meredeth Ide, MD  dutasteride (AVODART) 0.5 MG capsule Take 0.5 mg by mouth every evening.     Historical Provider, MD  furosemide (LASIX) 20 MG tablet Take 1 tablet (20 mg total) by mouth daily. 11/20/15   Kristen N Ward, DO  gabapentin (NEURONTIN) 100 MG capsule Take 100 mg by mouth at bedtime. 09/30/14   Historical Provider, MD  HYDROcodone-acetaminophen (NORCO/VICODIN) 5-325 MG tablet Take 1-2 tablets by mouth every 4 (four) hours as needed for moderate pain.  03/24/16   Meredeth Ide, MD  metFORMIN (GLUCOPHAGE) 500 MG tablet Take 1 tablet by mouth daily. 04/28/14   Historical Provider, MD  mirabegron ER (MYRBETRIQ) 25 MG TB24 tablet Take 50 mg by mouth every evening. *Adminstered in the evening*    Historical Provider, MD  omeprazole (PRILOSEC) 40 MG capsule Take 40 mg by mouth daily.    Historical Provider, MD  pindolol (VISKEN) 5 MG tablet Take 2.5 mg by mouth daily.  04/22/14   Historical Provider, MD  sitaGLIPtin (JANUVIA) 100 MG tablet Take 100 mg by mouth every evening.     Historical Provider, MD    Physical Exam: Filed Vitals:   05/20/16 0215 05/20/16 0230 05/20/16 0245 05/20/16 0300  BP: 100/57 88/57 90/57  109/59  Pulse: 103 74 97 88  Temp: 101.1 F (38.4 C) 100.9 F (38.3 C) 100.8 F (38.2 C) 100.6 F (38.1 C)  TempSrc:      Resp: 16 20 21 17   Height:      Weight:      SpO2: 95% 93% 94% 100%    Constitutional: NAD, calm, comfortable Filed Vitals:   05/20/16 0215 05/20/16 0230 05/20/16 0245 05/20/16 0300  BP: 100/57 88/57 90/57  109/59  Pulse: 103 74 97 88  Temp: 101.1 F (38.4 C) 100.9 F (38.3 C) 100.8 F (38.2 C) 100.6 F (38.1 C)  TempSrc:      Resp: 16 20 21 17   Height:      Weight:      SpO2: 95% 93% 94% 100%   Eyes: PERRL, lids and conjunctivae normal ENMT: Mucous membranes are moist. Posterior pharynx clear of any exudate or lesions.Normal dentition.  Neck: normal, supple, no masses, no thyromegaly Respiratory: clear to auscultation bilaterally, no wheezing, no crackles. Normal respiratory effort. No accessory muscle use.  Cardiovascular: Regular rate and rhythm, no murmurs / rubs / gallops. No extremity edema. 2+ pedal pulses. No carotid bruits.  Abdomen: no tenderness, no masses palpated. No hepatosplenomegaly. Bowel sounds positive.  Musculoskeletal: no clubbing / cyanosis. No joint deformity upper and lower extremities. Good ROM, no contractures. Normal muscle tone.  Skin: no rashes, lesions, ulcers. No  induration Neurologic: CN 2-12 grossly intact. Sensation intact, DTR normal. Strength 5/5 in all 4.  Psychiatric: Normal judgment and insight. Alert and oriented x 3. Normal mood.   Labs on Admission: I have personally reviewed following labs and imaging studies  CBC:  Recent Labs Lab 05/19/16 2348  WBC 11.7*  NEUTROABS 10.8*  HGB 12.5*  HCT 37.3*  MCV 96.6  PLT 111*   Basic Metabolic Panel:  Recent Labs Lab 05/19/16 2348  NA 135  K 3.8  CL 102  CO2 23  GLUCOSE 202*  BUN 31*  CREATININE 1.61*  CALCIUM 9.0   Liver Function Tests:  Recent Labs Lab 05/19/16 2348  AST 26  ALT 15*  ALKPHOS 72  BILITOT 1.0  PROT 8.0  ALBUMIN 4.3   Cardiac Enzymes:  Recent Labs Lab 05/19/16 2348  TROPONINI 0.07*   Urine analysis:    Component Value Date/Time   COLORURINE YELLOW 05/19/2016 2359   APPEARANCEUR CLEAR 05/19/2016 2359   LABSPEC 1.010 05/19/2016 2359   PHURINE 7.5 05/19/2016 2359   GLUCOSEU NEGATIVE 05/19/2016 2359   HGBUR MODERATE* 05/19/2016 2359   BILIRUBINUR NEGATIVE 05/19/2016 2359   KETONESUR NEGATIVE 05/19/2016 2359   PROTEINUR 30* 05/19/2016 2359   UROBILINOGEN 0.2 01/29/2012 2204   NITRITE POSITIVE* 05/19/2016 2359   LEUKOCYTESUR MODERATE* 05/19/2016 2359   Sepsis Labs: @LABRCNTIP (procalcitonin:4,lacticidven:4) ) Recent Results (from the past 240 hour(s))  Blood Culture (routine x 2)     Status: None (Preliminary result)   Collection Time: 05/20/16 12:08 AM  Result Value Ref Range Status   Specimen Description BLOOD RIGHT HAND  Final   Special Requests BOTTLES DRAWN AEROBIC AND ANAEROBIC [facility]6CC EACH  Final   Culture PENDING  Incomplete   Report Status PENDING  Incomplete  Blood Culture (routine x 2)     Status: None (Preliminary result)   Collection Time: 05/20/16 12:20 AM  Result Value Ref Range Status   Specimen Description BLOOD RIGHT ANTECUBITAL  Final   Special Requests BOTTLES DRAWN AEROBIC ONLY 2CC  Final   Culture PENDING  Incomplete    Report Status PENDING  Incomplete     Radiological Exams on Admission: Ct Head Wo Contrast  05/20/2016  CLINICAL DATA:  Acute onset of altered mental status. Status post syncope. Patient unresponsive. Fever and vomiting. Initial encounter. EXAM: CT HEAD WITHOUT CONTRAST CT CERVICAL SPINE WITHOUT CONTRAST TECHNIQUE: Multidetector CT imaging of the head and cervical spine was performed following the standard protocol without intravenous contrast. Multiplanar CT image reconstructions of the cervical spine were also generated. COMPARISON:  CT of the head performed 09/28/2014 FINDINGS: CT HEAD FINDINGS There is no evidence of acute infarction, mass lesion, or intra- or extra-axial hemorrhage on CT. Prominence of ventricles and sulci reflects moderate cortical volume loss. Mild cerebellar atrophy is noted. Scattered periventricular and subcortical white matter change likely reflects small vessel ischemic microangiopathy. A chronic infarct is noted at the medial left occipital lobe, with associated encephalomalacia. The brainstem and fourth ventricle are within normal limits. The basal ganglia are unremarkable in appearance. No mass effect or midline shift is seen. There is no evidence of fracture; visualized osseous structures are unremarkable in appearance. The orbits are within normal limits. There is mild partial opacification of the right mastoid air cells. The paranasal sinuses and left mastoid air cells are well-aerated. No significant soft tissue abnormalities are seen. CT CERVICAL SPINE FINDINGS There is no evidence of acute fracture or subluxation. Vertebral bodies demonstrate normal height. There is minimal grade 1 anterolisthesis of C3 on C4, and minimal grade 1 anterolisthesis of C6 on C7. Multilevel disc space narrowing is noted along the mid cervical spine, with anterior and posterior disc osteophyte complexes. Underlying facet disease is noted along the cervical spine. Prevertebral soft tissues are  within normal limits. A 4.2 cm hypodensity is suggested at the left thyroid lobe. Mild interstitial prominence is noted at the lung apices. Scattered calcification is seen at the carotid bifurcations bilaterally. IMPRESSION: 1. No evidence of traumatic intracranial injury or fracture. 2. No evidence of acute fracture or subluxation along the cervical spine. 3. Moderate cortical volume loss and scattered small vessel ischemic microangiopathy. 4. 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 5. Degenerative change  along the cervical spine. 6. 4.2 cm hypodensity suggested at the left thyroid lobe. Consider further evaluation with thyroid ultrasound, as deemed clinically appropriate. If patient is clinically hyperthyroid, consider nuclear medicine thyroid uptake and scan. 7. Mild interstitial prominence at the lung apices. 8. Scattered calcification at the carotid bifurcations bilaterally. Carotid ultrasound would be helpful for further evaluation, when and as deemed clinically appropriate. 9. Mild partial opacification of the right mastoid air cells. Electronically Signed   By: Roanna Raider M.D.   On: 05/20/2016 02:18   Ct Cervical Spine Wo Contrast  05/20/2016  CLINICAL DATA:  Acute onset of altered mental status. Status post syncope. Patient unresponsive. Fever and vomiting. Initial encounter. EXAM: CT HEAD WITHOUT CONTRAST CT CERVICAL SPINE WITHOUT CONTRAST TECHNIQUE: Multidetector CT imaging of the head and cervical spine was performed following the standard protocol without intravenous contrast. Multiplanar CT image reconstructions of the cervical spine were also generated. COMPARISON:  CT of the head performed 09/28/2014 FINDINGS: CT HEAD FINDINGS There is no evidence of acute infarction, mass lesion, or intra- or extra-axial hemorrhage on CT. Prominence of ventricles and sulci reflects moderate cortical volume loss. Mild cerebellar atrophy is noted. Scattered  periventricular and subcortical white matter change likely reflects small vessel ischemic microangiopathy. A chronic infarct is noted at the medial left occipital lobe, with associated encephalomalacia. The brainstem and fourth ventricle are within normal limits. The basal ganglia are unremarkable in appearance. No mass effect or midline shift is seen. There is no evidence of fracture; visualized osseous structures are unremarkable in appearance. The orbits are within normal limits. There is mild partial opacification of the right mastoid air cells. The paranasal sinuses and left mastoid air cells are well-aerated. No significant soft tissue abnormalities are seen. CT CERVICAL SPINE FINDINGS There is no evidence of acute fracture or subluxation. Vertebral bodies demonstrate normal height. There is minimal grade 1 anterolisthesis of C3 on C4, and minimal grade 1 anterolisthesis of C6 on C7. Multilevel disc space narrowing is noted along the mid cervical spine, with anterior and posterior disc osteophyte complexes. Underlying facet disease is noted along the cervical spine. Prevertebral soft tissues are within normal limits. A 4.2 cm hypodensity is suggested at the left thyroid lobe. Mild interstitial prominence is noted at the lung apices. Scattered calcification is seen at the carotid bifurcations bilaterally. IMPRESSION: 1. No evidence of traumatic intracranial injury or fracture. 2. No evidence of acute fracture or subluxation along the cervical spine. 3. Moderate cortical volume loss and scattered small vessel ischemic microangiopathy. 4. 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 5. Degenerative change along the cervical spine. 6. 4.2 cm hypodensity suggested at the left thyroid lobe. Consider further evaluation with thyroid ultrasound, as deemed clinically appropriate. If patient is clinically hyperthyroid, consider nuclear medicine thyroid uptake and scan. 7. Mild  interstitial prominence at the lung apices. 8. Scattered calcification at the carotid bifurcations bilaterally. Carotid ultrasound would be helpful for further evaluation, when and as deemed clinically appropriate. 9. Mild partial opacification of the right mastoid air cells. Electronically Signed   By: Roanna Raider M.D.   On: 05/20/2016 02:18   Ct Abdomen Pelvis W Contrast  05/20/2016  CLINICAL DATA:  82 year old male with altered mental status syncope. Patient complaining of abdominal pain. EXAM: CT ABDOMEN AND PELVIS WITH CONTRAST TECHNIQUE: Multidetector CT imaging of the abdomen and pelvis was performed using the standard protocol following bolus administration of intravenous contrast. CONTRAST:  ISOVUE-300 IOPAMIDOL (ISOVUE-300) INJECTION  61% COMPARISON:  Abdominal CT dated 03/22/2016 FINDINGS: Evaluation is limited due to streak artifact caused by patient's arms. Bibasilar linear atelectasis/ scarring. The visualized lung bases are otherwise clear. Mild cardiomegaly. Calcified mitral annulus. Coronary vascular calcification and CABG clips. No intra-abdominal free air or free fluid. Cholecystectomy. The liver, pancreas, spleen, adrenal glands appear unremarkable. There is a right ureteral pigtail stent with proximal tip in the right renal pelvis and distal end within the urinary bladder. There is mild fullness of the right renal pelvis with parent urothelial enhancement. Correlation with urinalysis recommended to exclude UTI. There is excreted contrast versus a 4 mm nonobstructing right renal upper pole calculus. A 3 mm nonobstructing left renal interpolar calculus noted. A 1.8 cm left renal interpolar hypodense lesion is not well characterized but likely represents a cyst. There is no hydronephrosis on the left. There is slight heterogeneous enhancement of the left kidney. Correlation with urinalysis recommended. The left ureter appears unremarkable. The urinary bladder is decompressed around a  Foley catheter. There is sigmoid diverticulosis without active inflammatory changes. There is no evidence of bowel obstruction. Fluid within the distal esophagus likely related to gastroesophageal reflux. There is apparent diffuse thickening of the wall of the stomach likely related to underdistention. Gastritis is less likely but not excluded. Clinical correlation is recommended. Normal appendix. There is advanced aortoiliac atherosclerotic disease. The IVC appears unremarkable. No portal venous gas identified. There is no adenopathy. There is a small fat containing left inguinal hernia. Small fat containing umbilical hernia is also noted. The abdominal wall soft tissues are otherwise unremarkable. There is osteopenia with multilevel degenerative changes of the spine. Lower lumbar laminectomy and L4-L5 disc spacer and posterior fusion hardware noted. No acute fracture. IMPRESSION: Colonic diverticulosis. No evidence of bowel obstruction. Normal appendix. Under distention of the stomach versus less likely gastritis. Clinical correlation is recommended. Interval placement of a right sided ureteral stent. There is mild fullness of the right renal pelvis with urothelial enhancement. Correlation with urinalysis recommended to exclude UTI. Excreted intravenous contrast versus a nonobstructing 4 mm right renal upper pole calculus. Electronically Signed   By: Elgie Collard M.D.   On: 05/20/2016 02:21   Dg Chest Port 1 View  05/20/2016  CLINICAL DATA:  82 year old male with shortness of breath EXAM: PORTABLE CHEST 1 VIEW COMPARISON:  Chest radiograph dated 03/22/2016 FINDINGS: Single portable view of the chest demonstrates mild cardiomegaly with increased central vascular prominence compatible with mild congestive changes. There is no focal consolidation, pleural effusion, or pneumothorax. Median sternotomy wires and CABG vascular clips. No acute osseous pathology. IMPRESSION: Mild cardiomegaly with mild congestion.   No focal consolidation. Electronically Signed   By: Elgie Collard M.D.   On: 05/20/2016 00:37    EKG: Independently reviewed. Afib, RBBB  Assessment/Plan Principal Problem:   Septic shock (HCC) Active Problems:   Diabetes mellitus type 2 with complications (HCC)   ATRIAL FIBRILLATION   Hydroureteronephrosis   UTI (lower urinary tract infection)   Chronic diastolic CHF (congestive heart failure) (HCC)  1. Septic shock. Secondary to UTI. Due to CHF and advanced age, after initial fluid bolus, he will be started on pressors. Will give hydrocortisone for sepsis requiring pressors. Follow up blood cultures. Will give the patient broad spectrum antibiotics. Continue with maintenance IVF.  2. UTI. UA positive for infection. Will give the patient antibiotics. 3. Lactic acidosis. Currently trending down. Continue to monitor.  4. Thrombocytopenia. Likely related to acute illness. Continue to monitor. 5. DM type 2  with complications. Serum glucose 202. Will start the patient on SSI.  6. Acute on chronic diastolic CHF. BNP and troponin elevated.   This will limit IVF generally required with sepsis.  7. Hydroureteronephrosis. Foley cath in place. 8. Chronic Afib. Patient takes ASA 9. Acute on CKD stage 3. Likely related to hypotension secondary to acute illness. Cr 1.61. Anticipate improvement with improved blood pressure. 10. GERD. Will continue on PPI. 11. Essential HTN. Hold antihypertensives due to septic shock. Resume when medically appropriate.  12. HLD. Continue statins.  DVT prophylaxis: Heparin Code Status: full, confirmed with daughter per EDP.  Family Communication: No family present at bedside. Disposition Plan: Admit to ICU. Discharge back to SNF once improved Consults called: none Admission status: admit as inpatient.  Houston Siren, MD FACP [facility]  If 7PM-7AM, please contact night-coverage [email or web address] Password [email or web address]  05/20/2016, 3:19 AM   By signing my name  below, I, Adron Bene, attest that this documentation has been prepared under the direction and in the presence of Houston Siren, MD. Electronically Signed: Adron Bene, Scribe 05/20/2016 3:20am

## 2016-05-20 NOTE — Progress Notes (Signed)
CRITICAL VALUE ALERT  Critical value received:  Gram positive cocci in clusters blood cultures  Date of notification:  05/20/2016  Time of notification:  1145  Critical value read back:yes  Nurse who received alert:  E.Kyliah Deanda RN  MD notified (1st page): Dr. Sherrie MustacheFisher  Time of first page:  1150  MD notified (2nd page):  Time of second page:  Responding MD: Dr. Sherrie MustacheFisher  Time MD responded: 1153

## 2016-05-20 NOTE — Progress Notes (Signed)
CRITICAL VALUE ALERT  Critical value received:  Staphylococcus growing in blood cultures  Date of notification:  05/20/2016  Time of notification:  1745  Critical value read back:yes  Nurse who received alert:  E.Doloros Kwolek RN  MD notified (1st page): Dr. Sherrie MustacheFisher  Time of first page:  1800  MD notified (2nd page):  Time of second page:  Responding MD:  Dr.Fisher  Time MD responded: 1800

## 2016-05-20 NOTE — Care Management Note (Signed)
Case Management Note  Patient Details  Name: Kyle CardGeorge W Hinesley MRN: [id number] Date of Birth: 03/11/1918  Subjective/Objective:                  Pt admitted with sepsis. Pt is from [facility]. Pt ambulates with walker at baseline, hard of hearing but alert and oriented. Anticipate pt will return to ALF at DC. ? Need for PT. CSW is aware that pt comes from facility, [facility]   Action/Plan: Will cont to follow, no CM needs anticipated.   Expected Discharge Date:    05/22/2016              Expected Discharge Plan:  Assisted Living / Rest Home  In-House Referral:  Clinical Social Work  Discharge planning Services  CM Consult  Post Acute Care Choice:  NA Choice offered to:  NA  DME Arranged:    DME Agency:     HH Arranged:    HH Agency:     Status of Service:  In process, will continue to follow  If discussed at Long Length of Stay Meetings, dates discussed:    Additional Comments:  Malcolm MetroChildress, Dink Creps Demske, RN 05/20/2016, 2:14 PM

## 2016-05-20 NOTE — Progress Notes (Signed)
Pharmacy Antibiotic Note  Stormy CardGeorge W Mccarver is a 82 y.o. male admitted on 05/19/2016 with sepsis.  Pharmacy has been consulted for vancomycin, aztreonam, and levaquin dosing. Initial doses given.  Plan: Vancomycin 1gm IV q24 hours Levaquin 500 mg IV q48 hours Aztreonam 1gm IV q8 hours F/u renal function, cultures and clinical course  Height: 5\' 7"  (170.2 cm) Weight: 181 lb 14.1 oz (82.5 kg) IBW/kg (Calculated) : 66.1  Temp (24hrs), Avg:101.2 F (38.4 C), Min:99.7 F (37.6 C), Max:103.3 F (39.6 C)   Recent Labs Lab 05/19/16 2348 05/20/16 0007 05/20/16 0302  WBC 11.7*  --   --   CREATININE 1.61*  --   --   LATICACIDVEN  --  2.62* 3.42*    Estimated Creatinine Clearance: 27.6 mL/min (by C-G formula based on Cr of 1.61).    Allergies  Allergen Reactions  . Acetaminophen-Codeine     Unknown reaction-not listed on current MAR  . Advil [Ibuprofen] Other (See Comments)    Unknown reaction  . Penicillins     Unknown reaction-not listed on current MAR    Antimicrobials this admission: levaquin 6/23 >>  vanc 6/23  >>  Aztreonam 6/23>>   Thank you for allowing pharmacy to be a part of this patient's care.  Talbert CageSeay, Nashya Garlington Poteet 05/20/2016 7:40 AM

## 2016-05-20 NOTE — ED Notes (Cosign Needed)
Dr. Blinda LeatherwoodPollina advised this nurse that this pt's fluid is to go at 125 ml/hr due to pt's lactic acid not being >4, his BNP is elevated and his chest xray shows some chest congestion.

## 2016-05-21 ENCOUNTER — Encounter (HOSPITAL_COMMUNITY): Payer: Self-pay | Admitting: Internal Medicine

## 2016-05-21 ENCOUNTER — Inpatient Hospital Stay (HOSPITAL_COMMUNITY): Payer: Medicare Other

## 2016-05-21 DIAGNOSIS — A419 Sepsis, unspecified organism: Secondary | ICD-10-CM

## 2016-05-21 DIAGNOSIS — R7881 Bacteremia: Secondary | ICD-10-CM

## 2016-05-21 DIAGNOSIS — R7989 Other specified abnormal findings of blood chemistry: Secondary | ICD-10-CM

## 2016-05-21 DIAGNOSIS — N390 Urinary tract infection, site not specified: Secondary | ICD-10-CM

## 2016-05-21 DIAGNOSIS — D696 Thrombocytopenia, unspecified: Secondary | ICD-10-CM

## 2016-05-21 DIAGNOSIS — A4101 Sepsis due to Methicillin susceptible Staphylococcus aureus: Principal | ICD-10-CM

## 2016-05-21 DIAGNOSIS — I10 Essential (primary) hypertension: Secondary | ICD-10-CM

## 2016-05-21 DIAGNOSIS — N184 Chronic kidney disease, stage 4 (severe): Secondary | ICD-10-CM

## 2016-05-21 DIAGNOSIS — R6521 Severe sepsis with septic shock: Secondary | ICD-10-CM

## 2016-05-21 DIAGNOSIS — I482 Chronic atrial fibrillation: Secondary | ICD-10-CM

## 2016-05-21 DIAGNOSIS — I5032 Chronic diastolic (congestive) heart failure: Secondary | ICD-10-CM

## 2016-05-21 DIAGNOSIS — J9601 Acute respiratory failure with hypoxia: Secondary | ICD-10-CM

## 2016-05-21 DIAGNOSIS — D649 Anemia, unspecified: Secondary | ICD-10-CM | POA: Diagnosis present

## 2016-05-21 DIAGNOSIS — E118 Type 2 diabetes mellitus with unspecified complications: Secondary | ICD-10-CM

## 2016-05-21 DIAGNOSIS — E785 Hyperlipidemia, unspecified: Secondary | ICD-10-CM

## 2016-05-21 LAB — ECHOCARDIOGRAM COMPLETE
Area-P 1/2: 3.14 cm2
CHL CUP MV DEC (S): 165
EWDT: 165 ms
FS: 24 % — AB (ref 28–44)
Height: 67 in
IVS/LV PW RATIO, ED: 1
LA diam index: 2.61 cm/m2
LA vol index: 43 mL/m2
LA vol: 87.3 mL
LASIZE: 53 mm
LAVOLA4C: 75 mL
LEFT ATRIUM END SYS DIAM: 53 mm
LVOT area: 3.14 cm2
LVOTD: 20 mm
MV Annulus VTI: 29.1 cm
MV M vel: 54
MV Peak grad: 6 mmHg
MV VTI: 107 cm
MVG: 2 mmHg
MVPKAVEL: 35.2 m/s
MVPKEVEL: 125 m/s
MVSPHT: 70 ms
PW: 15.9 mm — AB (ref 0.6–1.1)
RV sys press: 39 mmHg
Reg peak vel: 300 cm/s
TAPSE: 14.7 mm
TR max vel: 300 cm/s
Weight: 3008.84 oz

## 2016-05-21 LAB — CBC
HCT: 31.8 % — ABNORMAL LOW (ref 39.0–52.0)
HEMOGLOBIN: 10.8 g/dL — AB (ref 13.0–17.0)
MCH: 32.8 pg (ref 26.0–34.0)
MCHC: 34 g/dL (ref 30.0–36.0)
MCV: 96.7 fL (ref 78.0–100.0)
Platelets: 76 10*3/uL — ABNORMAL LOW (ref 150–400)
RBC: 3.29 MIL/uL — AB (ref 4.22–5.81)
RDW: 14 % (ref 11.5–15.5)
WBC: 15.8 10*3/uL — ABNORMAL HIGH (ref 4.0–10.5)

## 2016-05-21 LAB — HEMOGLOBIN A1C
HEMOGLOBIN A1C: 6.6 % — AB (ref 4.8–5.6)
MEAN PLASMA GLUCOSE: 143 mg/dL

## 2016-05-21 LAB — GLUCOSE, CAPILLARY
GLUCOSE-CAPILLARY: 123 mg/dL — AB (ref 65–99)
GLUCOSE-CAPILLARY: 169 mg/dL — AB (ref 65–99)
Glucose-Capillary: 125 mg/dL — ABNORMAL HIGH (ref 65–99)
Glucose-Capillary: 135 mg/dL — ABNORMAL HIGH (ref 65–99)

## 2016-05-21 LAB — COMPREHENSIVE METABOLIC PANEL
ALBUMIN: 3 g/dL — AB (ref 3.5–5.0)
ALK PHOS: 49 U/L (ref 38–126)
ALT: 13 U/L — ABNORMAL LOW (ref 17–63)
AST: 25 U/L (ref 15–41)
Anion gap: 8 (ref 5–15)
BILIRUBIN TOTAL: 0.8 mg/dL (ref 0.3–1.2)
BUN: 35 mg/dL — AB (ref 6–20)
CALCIUM: 8 mg/dL — AB (ref 8.9–10.3)
CO2: 22 mmol/L (ref 22–32)
Chloride: 104 mmol/L (ref 101–111)
Creatinine, Ser: 1.64 mg/dL — ABNORMAL HIGH (ref 0.61–1.24)
GFR calc Af Amer: 39 mL/min — ABNORMAL LOW (ref >60)
GFR calc non Af Amer: 34 mL/min — ABNORMAL LOW (ref >60)
GLUCOSE: 123 mg/dL — AB (ref 65–99)
Potassium: 3.7 mmol/L (ref 3.5–5.1)
Sodium: 134 mmol/L — ABNORMAL LOW (ref 135–145)
TOTAL PROTEIN: 6 g/dL — AB (ref 6.5–8.1)

## 2016-05-21 LAB — LACTIC ACID, PLASMA: Lactic Acid, Venous: 2.2 mmol/L (ref 0.5–2.0)

## 2016-05-21 LAB — TROPONIN I: Troponin I: 0.27 ng/mL — ABNORMAL HIGH (ref <0.031)

## 2016-05-21 MED ORDER — CEFAZOLIN SODIUM 1 G IJ SOLR
INTRAMUSCULAR | Status: AC
  Filled 2016-05-21: qty 20

## 2016-05-21 MED ORDER — CEFAZOLIN SODIUM 500 MG IJ SOLR: 2.0000 mg | Freq: Once | INTRAMUSCULAR | Status: DC

## 2016-05-21 MED ORDER — CETYLPYRIDINIUM CHLORIDE 0.05 % MT LIQD
7.0000 mL | Freq: Two times a day (BID) | OROMUCOSAL | Status: DC
  Administered 2016-05-21 – 2016-05-24 (×6): 7 mL via OROMUCOSAL

## 2016-05-21 MED ORDER — PINDOLOL 5 MG PO TABS
2.5000 mg | ORAL_TABLET | Freq: Two times a day (BID) | ORAL | Status: DC
  Administered 2016-05-21 – 2016-05-22 (×3): 2.5 mg via ORAL
  Filled 2016-05-21 (×5): qty 1

## 2016-05-21 MED ORDER — GABAPENTIN 100 MG PO CAPS
100.0000 mg | ORAL_CAPSULE | Freq: Every day | ORAL | Status: DC
  Administered 2016-05-21 – 2016-05-23 (×3): 100 mg via ORAL
  Filled 2016-05-21 (×3): qty 1

## 2016-05-21 MED ORDER — ENOXAPARIN SODIUM 30 MG/0.3ML ~~LOC~~ SOLN
30.0000 mg | SUBCUTANEOUS | Status: DC
  Administered 2016-05-21 – 2016-05-22 (×2): 30 mg via SUBCUTANEOUS
  Filled 2016-05-21 (×2): qty 0.3

## 2016-05-21 MED ORDER — CEFAZOLIN SODIUM-DEXTROSE 2-4 GM/100ML-% IV SOLN
2.0000 g | Freq: Two times a day (BID) | INTRAVENOUS | Status: DC
  Administered 2016-05-21 – 2016-05-24 (×6): 2 g via INTRAVENOUS
  Filled 2016-05-21 (×10): qty 100

## 2016-05-21 MED ORDER — CEFAZOLIN SODIUM 1 G IJ SOLR
2.0000 g | Freq: Once | INTRAMUSCULAR | Status: AC
  Administered 2016-05-21: 2 g via INTRAMUSCULAR
  Filled 2016-05-21: qty 20

## 2016-05-21 NOTE — Progress Notes (Addendum)
PROGRESS NOTE    Kyle CardGeorge W Blumenfeld  WUJ:[id number] DOB: 1918/04/05 DOA: 05/19/2016 PCP: [name], MD   Brief Narrative:  Patient is a 82 year old man with a history of right hydronephrosis, status post stent April 2017, chronic atrial fibrillation, chronic diastolic congestive heart failure, and diabetes mellitus, who presented to the ED after being found down and unresponsive at the ALF. Reportedly, he had several episodes of emesis. In the ED, he was found to be febrile, tachycardic, tachypnea, hypotensive, and hypoxic. His lab data were significant for a creatinine of 1.61, troponin I of 0.07, lactic acid of 262, BNP of 709, WBC of 11.7, and platelet count of 111. His urinalysis revealed too numerous to count WBCs. He was admitted for further evaluation and management of septic shock and UTI.    Assessment & Plan:   Principal Problem:   Staphylococcus aureus bacteremia with sepsis (HCC) Active Problems:   UTI (lower urinary tract infection)   Syncope and collapse   Septic shock (HCC)   Acute respiratory failure with hypoxia (HCC)   Elevated troponin   Diabetes mellitus type 2 with complications (HCC)   Chronic atrial fibrillation (HCC)   Hydroureteronephrosis   Chronic diastolic CHF (congestive heart failure) (HCC)   Pressure ulcer   Thrombocytopenia (HCC)   1. Septic shock/lactic acidosis with staph aureus bacteremia. On admission, the patient was febrile with a temperature of greater than 103. His white blood cell count was 11.7, but it increased to 27.2.  Blood cultures and a urine culture ordered on admission. Both blood cultures are growing Staphylococcus aureus; sensitivities are pending. Given his urinary tract infection, the staph aureus is likely from it, but urine culture is still pending. -Patient was started on broad-spectrum antibiotics with vancomycin, aztreonam, and Levaquin. IV Solu-Cortef was given 24 hours. As the patient improves, we'll narrow antibiotics. Patient  was also started on modest IV fluids (due to his history of diastolic heart failure) and pressor support with Neo-Synephrine. -His blood pressure has improved and Neo-Synephrine was titrated off. Will continue gentle IV fluids. -White blood cell count is trending downward, but he is still febrile. -We'll order 2-D echocardiogram in the setting of staph aureus bacteremia. However, given his age and debilitation, I doubt that he would be a candidate for invasive treatment if he has endocarditis.   Urinary tract infection, likely polynephritis. As above. Is also important to note that the patient has a history of right hydronephrosis with an indwelling ureteral stent 02/2016. Continue antibiotics and supportive treatment. Urine culture pending.  Acute respiratory failure with hypoxia. Patient's oxygen saturation was in the 80s intermittently in the ED. However, on 1.5 L of oxygen, his ABG revealed PO2 of 85. The transient hypoxia was likely from septic shock. We'll continue oxygen supplementation as needed.  Elevated troponin I in a patient with CAD/status post CABG. Patient troponin I was 0.07 on admission, but has trended up to 0.27.EKG revealed atrial fibrillation with right bundle branch block. The patient denies chest pain. Elevation is likely from demand ischemia as a consequence of septic. -She is treated chronically with pindolol and aspirin. Will continue aspirin and restart atenolol as his blood pressure improves.  Chronic atrial fibrillation. Patient is treated with aspirin and pindolol. His rate was elevated due to sepsis on admission. -We'll restart pindolol as his blood pressure improves.  Chronic diastolic heart failure. Patient is treated with Lasix. Lasix is on hold secondary to treatment of septic shock. -2-D echocardiogram ordered and is pending.  Stage  III to stage IV chronic kidney disease. Patient's baseline creatinine appears to be 1.8, but has ranged from 1.5-2.1 over  the past 6 months. Creatinine was 1.61 on admission, stable. We'll continue to monitor.  Thrombocytopenia. Patient's platelet count was 111 on admission. He was started on subcutaneous heparin for DVT prophylaxis with caution. -We'll stop heparin and start Lovenox instead. If his platelet count continues to drop, will stop Lovenox and start SCDs. -His vitamin B12 level and TSH were within normal limits.  Type 2 diabetes mellitus with nephropathy. Patient is treated with Januvia and metformin chronically. Both are on hold. Sliding scale NovoLog was started. His CBGs have been controlled. His hemoglobin A1c was 6.6.    DVT prophylaxis: Subcutaneous heparin>> Lovenox Code Status: Full code Family Communication: Family not available Disposition Plan: Discharge back to ALF, but patient may need SNF when clinically stable for discharge.   Consultants:   None  Procedures:   Echo pending  Antimicrobials:  Vancomycin 05/20/16>>  Aztreonam 05/20/16>>  Levaquin 05/20/16>>    Subjective: Patient denies chest pain, cough, diarrhea, or abdominal pain. He denies flank pain. No nausea vomiting.  Objective: Filed Vitals:   05/21/16 0500 05/21/16 0600 05/21/16 0700 05/21/16 0800  BP: 103/51 101/59 106/63 125/68  Pulse: 78 69 98 113  Temp: 100.9 F (38.3 C) 101.1 F (38.4 C) 101.5 F (38.6 C) 102 F (38.9 C)  TempSrc:      Resp: 23 19 26 27   Height:      Weight:      SpO2: 98% 98% 99% 99%    Intake/Output Summary (Last 24 hours) at 05/21/16 0810 Last data filed at 05/21/16 0522  Gross per 24 hour  Intake 2239.67 ml  Output   1200 ml  Net 1039.67 ml   Filed Weights   05/19/16 2359 05/20/16 0459 05/21/16 0400  Weight: 82.101 kg (181 lb) 82.5 kg (181 lb 14.1 oz) 85.3 kg (188 lb 0.8 oz)    Examination:  General exam: 82 year old elderly Caucasian man in no acute distress. Respiratory system: Clear to auscultation; decreased breath sounds in the bases. Respiratory effort  normal. Cardiovascular system: Irregular, irregular with a soft systolic murmur and borderline tachycardia. No JVD, murmurs, rubs, gallops or clicks. No pedal edema. Gastrointestinal system: Abdomen is nondistended, soft and nontender. No organomegaly or masses felt. Normal bowel sounds heard. Central nervous system: Alert and oriented. No focal neurological deficits, but very hard of hearing. Extremities: Symmetric-patient able to lift legs against gravity approximately 10. Skin: No rashes, lesions or ulcers Psychiatry: Judgement and insight appear normal. Pleasant affect. Speech is clear.    Data Reviewed: I have personally reviewed following labs and imaging studies  CBC:  Recent Labs Lab 05/19/16 2348 05/20/16 0734 05/21/16 0755  WBC 11.7* 27.2* 15.8*  NEUTROABS 10.8*  --   --   HGB 12.5* 10.8* 10.8*  HCT 37.3* 32.4* 31.8*  MCV 96.6 98.5 96.7  PLT 111* 100* PENDING   Basic Metabolic Panel:  Recent Labs Lab 05/19/16 2348 05/20/16 0734 05/21/16 0440  NA 135 134* 134*  K 3.8 3.6 3.7  CL 102 102 104  CO2 23 24 22   GLUCOSE 202* 188* 123*  BUN 31* 32* 35*  CREATININE 1.61* 1.64* 1.64*  CALCIUM 9.0 8.0* 8.0*   GFR: Estimated Creatinine Clearance: 27.5 mL/min (by C-G formula based on Cr of 1.64). Liver Function Tests:  Recent Labs Lab 05/19/16 2348 05/20/16 0734 05/21/16 0440  AST 26 28 25   ALT 15* 14* 13*  ALKPHOS 72 56 49  BILITOT 1.0 1.2 0.8  PROT 8.0 6.4* 6.0*  ALBUMIN 4.3 3.3* 3.0*   No results for input(s): LIPASE, AMYLASE in the last 168 hours. No results for input(s): AMMONIA in the last 168 hours. Coagulation Profile: No results for input(s): INR, PROTIME in the last 168 hours. Cardiac Enzymes:  Recent Labs Lab 05/19/16 2348 05/21/16 0440  TROPONINI 0.07* 0.27*   BNP (last 3 results) No results for input(s): PROBNP in the last 8760 hours. HbA1C:  Recent Labs  05/20/16 0734  HGBA1C 6.6*   CBG:  Recent Labs Lab 05/20/16 0749  05/20/16 1125 05/20/16 1631 05/20/16 2122 05/21/16 0758  GLUCAP 183* 197* 158* 135* 125*   Lipid Profile: No results for input(s): CHOL, HDL, LDLCALC, TRIG, CHOLHDL, LDLDIRECT in the last 72 hours. Thyroid Function Tests:  Recent Labs  05/20/16 0734  TSH 1.136   Anemia Panel:  Recent Labs  05/20/16 0734  VITAMINB12 260  TIBC 203*  IRON 9*   Sepsis Labs:  Recent Labs Lab 05/20/16 0007 05/20/16 0302  LATICACIDVEN 2.62* 3.42*    Recent Results (from the past 240 hour(s))  Blood Culture (routine x 2)     Status: None (Preliminary result)   Collection Time: 05/20/16 12:08 AM  Result Value Ref Range Status   Specimen Description BLOOD RIGHT HAND  Final   Special Requests BOTTLES DRAWN AEROBIC AND ANAEROBIC 6CC EACH  Final   Culture  Setup Time   Final    GRAM POSITIVE COCCI IN CLUSTERS AEROBIC BOTTLE ONLY Gram Stain Report Called to,Read Back By and Verified With: SPANGLER E. AT 1145A ON [id number] BY THOMPSON S. CRITICAL RESULT CALLED TO, READ BACK BY AND VERIFIED WITH: E SPANGLER,PHARMD AT 1740 05/20/16 BY Elson Clan Performed at [facility]    Culture PENDING  Incomplete   Report Status PENDING  Incomplete  Blood Culture ID Panel (Reflexed)     Status: Abnormal   Collection Time: 05/20/16 12:08 AM  Result Value Ref Range Status   Enterococcus species NOT DETECTED NOT DETECTED Final   Vancomycin resistance NOT DETECTED NOT DETECTED Final   Listeria monocytogenes NOT DETECTED NOT DETECTED Final   Staphylococcus species DETECTED (A) NOT DETECTED Final    Comment: CRITICAL RESULT CALLED TO, READ BACK BY AND VERIFIED WITH: Desma Maxim RN 17:40 05/20/16 ([email or web address])    Staphylococcus aureus DETECTED (A) NOT DETECTED Final    Comment: Desma Maxim RN 17:40 05/20/16 ([email or web address])   Methicillin resistance NOT DETECTED NOT DETECTED Final   Streptococcus species NOT DETECTED NOT DETECTED Final   Streptococcus agalactiae NOT DETECTED NOT DETECTED Final   Streptococcus  pneumoniae NOT DETECTED NOT DETECTED Final   Streptococcus pyogenes NOT DETECTED NOT DETECTED Final   Acinetobacter baumannii NOT DETECTED NOT DETECTED Final   Enterobacteriaceae species NOT DETECTED NOT DETECTED Final   Enterobacter cloacae complex NOT DETECTED NOT DETECTED Final   Escherichia coli NOT DETECTED NOT DETECTED Final   Klebsiella oxytoca NOT DETECTED NOT DETECTED Final   Klebsiella pneumoniae NOT DETECTED NOT DETECTED Final   Proteus species NOT DETECTED NOT DETECTED Final   Serratia marcescens NOT DETECTED NOT DETECTED Final   Carbapenem resistance NOT DETECTED NOT DETECTED Final   Haemophilus influenzae NOT DETECTED NOT DETECTED Final   Neisseria meningitidis NOT DETECTED NOT DETECTED Final   Pseudomonas aeruginosa NOT DETECTED NOT DETECTED Final   Candida albicans NOT DETECTED NOT DETECTED Final   Candida glabrata NOT DETECTED NOT DETECTED Final   Candida krusei NOT  DETECTED NOT DETECTED Final   Candida parapsilosis NOT DETECTED NOT DETECTED Final   Candida tropicalis NOT DETECTED NOT DETECTED Final  Blood Culture (routine x 2)     Status: None (Preliminary result)   Collection Time: 05/20/16 12:20 AM  Result Value Ref Range Status   Specimen Description BLOOD RIGHT ANTECUBITAL  Final   Special Requests BOTTLES DRAWN AEROBIC ONLY 2CC  Final   Culture  Setup Time   Final    GRAM POSITIVE COCCI Gram Stain Report Called to,Read Back By and Verified With: CHAPEL,R. AT 1305 ON 05/20/2016 BY AGUNDIZ,E. AEROBIC BOTTLE ONLY Performed at [facility] Performed at [facility]    Culture NO GROWTH 1 DAY  Final   Report Status PENDING  Incomplete  MRSA PCR Screening     Status: None   Collection Time: 05/20/16  4:47 AM  Result Value Ref Range Status   MRSA by PCR NEGATIVE NEGATIVE Final    Comment:        The GeneXpert MRSA Assay (FDA approved for NASAL specimens only), is one component of a comprehensive MRSA colonization surveillance program. It is  not intended to diagnose MRSA infection nor to guide or monitor treatment for MRSA infections.          Radiology Studies: Ct Head Wo Contrast  05/20/2016  CLINICAL DATA:  Acute onset of altered mental status. Status post syncope. Patient unresponsive. Fever and vomiting. Initial encounter. EXAM: CT HEAD WITHOUT CONTRAST CT CERVICAL SPINE WITHOUT CONTRAST TECHNIQUE: Multidetector CT imaging of the head and cervical spine was performed following the standard protocol without intravenous contrast. Multiplanar CT image reconstructions of the cervical spine were also generated. COMPARISON:  CT of the head performed 09/28/2014 FINDINGS: CT HEAD FINDINGS There is no evidence of acute infarction, mass lesion, or intra- or extra-axial hemorrhage on CT. Prominence of ventricles and sulci reflects moderate cortical volume loss. Mild cerebellar atrophy is noted. Scattered periventricular and subcortical white matter change likely reflects small vessel ischemic microangiopathy. A chronic infarct is noted at the medial left occipital lobe, with associated encephalomalacia. The brainstem and fourth ventricle are within normal limits. The basal ganglia are unremarkable in appearance. No mass effect or midline shift is seen. There is no evidence of fracture; visualized osseous structures are unremarkable in appearance. The orbits are within normal limits. There is mild partial opacification of the right mastoid air cells. The paranasal sinuses and left mastoid air cells are well-aerated. No significant soft tissue abnormalities are seen. CT CERVICAL SPINE FINDINGS There is no evidence of acute fracture or subluxation. Vertebral bodies demonstrate normal height. There is minimal grade 1 anterolisthesis of C3 on C4, and minimal grade 1 anterolisthesis of C6 on C7. Multilevel disc space narrowing is noted along the mid cervical spine, with anterior and posterior disc osteophyte complexes. Underlying facet disease is  noted along the cervical spine. Prevertebral soft tissues are within normal limits. A 4.2 cm hypodensity is suggested at the left thyroid lobe. Mild interstitial prominence is noted at the lung apices. Scattered calcification is seen at the carotid bifurcations bilaterally. IMPRESSION: 1. No evidence of traumatic intracranial injury or fracture. 2. No evidence of acute fracture or subluxation along the cervical spine. 3. Moderate cortical volume loss and scattered small vessel ischemic microangiopathy. 4. 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 5. Degenerative change along the cervical spine. 6. 4.2 cm hypodensity suggested at the left thyroid lobe. Consider further evaluation with thyroid ultrasound, as  deemed clinically appropriate. If patient is clinically hyperthyroid, consider nuclear medicine thyroid uptake and scan. 7. Mild interstitial prominence at the lung apices. 8. Scattered calcification at the carotid bifurcations bilaterally. Carotid ultrasound would be helpful for further evaluation, when and as deemed clinically appropriate. 9. Mild partial opacification of the right mastoid air cells. Electronically Signed   By: Roanna Raider M.D.   On: 05/20/2016 02:18   Ct Cervical Spine Wo Contrast  05/20/2016  CLINICAL DATA:  Acute onset of altered mental status. Status post syncope. Patient unresponsive. Fever and vomiting. Initial encounter. EXAM: CT HEAD WITHOUT CONTRAST CT CERVICAL SPINE WITHOUT CONTRAST TECHNIQUE: Multidetector CT imaging of the head and cervical spine was performed following the standard protocol without intravenous contrast. Multiplanar CT image reconstructions of the cervical spine were also generated. COMPARISON:  CT of the head performed 09/28/2014 FINDINGS: CT HEAD FINDINGS There is no evidence of acute infarction, mass lesion, or intra- or extra-axial hemorrhage on CT. Prominence of ventricles and sulci reflects moderate cortical  volume loss. Mild cerebellar atrophy is noted. Scattered periventricular and subcortical white matter change likely reflects small vessel ischemic microangiopathy. A chronic infarct is noted at the medial left occipital lobe, with associated encephalomalacia. The brainstem and fourth ventricle are within normal limits. The basal ganglia are unremarkable in appearance. No mass effect or midline shift is seen. There is no evidence of fracture; visualized osseous structures are unremarkable in appearance. The orbits are within normal limits. There is mild partial opacification of the right mastoid air cells. The paranasal sinuses and left mastoid air cells are well-aerated. No significant soft tissue abnormalities are seen. CT CERVICAL SPINE FINDINGS There is no evidence of acute fracture or subluxation. Vertebral bodies demonstrate normal height. There is minimal grade 1 anterolisthesis of C3 on C4, and minimal grade 1 anterolisthesis of C6 on C7. Multilevel disc space narrowing is noted along the mid cervical spine, with anterior and posterior disc osteophyte complexes. Underlying facet disease is noted along the cervical spine. Prevertebral soft tissues are within normal limits. A 4.2 cm hypodensity is suggested at the left thyroid lobe. Mild interstitial prominence is noted at the lung apices. Scattered calcification is seen at the carotid bifurcations bilaterally. IMPRESSION: 1. No evidence of traumatic intracranial injury or fracture. 2. No evidence of acute fracture or subluxation along the cervical spine. 3. Moderate cortical volume loss and scattered small vessel ischemic microangiopathy. 4. 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 5. Degenerative change along the cervical spine. 6. 4.2 cm hypodensity suggested at the left thyroid lobe. Consider further evaluation with thyroid ultrasound, as deemed clinically appropriate. If patient is clinically hyperthyroid,  consider nuclear medicine thyroid uptake and scan. 7. Mild interstitial prominence at the lung apices. 8. Scattered calcification at the carotid bifurcations bilaterally. Carotid ultrasound would be helpful for further evaluation, when and as deemed clinically appropriate. 9. Mild partial opacification of the right mastoid air cells. Electronically Signed   By: Roanna Raider M.D.   On: 05/20/2016 02:18   Ct Abdomen Pelvis W Contrast  05/20/2016  CLINICAL DATA:  82 year old male with altered mental status syncope. Patient complaining of abdominal pain. EXAM: CT ABDOMEN AND PELVIS WITH CONTRAST TECHNIQUE: Multidetector CT imaging of the abdomen and pelvis was performed using the standard protocol following bolus administration of intravenous contrast. CONTRAST:  ISOVUE-300 IOPAMIDOL (ISOVUE-300) INJECTION 61% COMPARISON:  Abdominal CT dated 03/22/2016 FINDINGS: Evaluation is limited due to streak artifact caused by patient's arms. Bibasilar linear  atelectasis/ scarring. The visualized lung bases are otherwise clear. Mild cardiomegaly. Calcified mitral annulus. Coronary vascular calcification and CABG clips. No intra-abdominal free air or free fluid. Cholecystectomy. The liver, pancreas, spleen, adrenal glands appear unremarkable. There is a right ureteral pigtail stent with proximal tip in the right renal pelvis and distal end within the urinary bladder. There is mild fullness of the right renal pelvis with parent urothelial enhancement. Correlation with urinalysis recommended to exclude UTI. There is excreted contrast versus a 4 mm nonobstructing right renal upper pole calculus. A 3 mm nonobstructing left renal interpolar calculus noted. A 1.8 cm left renal interpolar hypodense lesion is not well characterized but likely represents a cyst. There is no hydronephrosis on the left. There is slight heterogeneous enhancement of the left kidney. Correlation with urinalysis recommended. The left ureter appears  unremarkable. The urinary bladder is decompressed around a Foley catheter. There is sigmoid diverticulosis without active inflammatory changes. There is no evidence of bowel obstruction. Fluid within the distal esophagus likely related to gastroesophageal reflux. There is apparent diffuse thickening of the wall of the stomach likely related to underdistention. Gastritis is less likely but not excluded. Clinical correlation is recommended. Normal appendix. There is advanced aortoiliac atherosclerotic disease. The IVC appears unremarkable. No portal venous gas identified. There is no adenopathy. There is a small fat containing left inguinal hernia. Small fat containing umbilical hernia is also noted. The abdominal wall soft tissues are otherwise unremarkable. There is osteopenia with multilevel degenerative changes of the spine. Lower lumbar laminectomy and L4-L5 disc spacer and posterior fusion hardware noted. No acute fracture. IMPRESSION: Colonic diverticulosis. No evidence of bowel obstruction. Normal appendix. Under distention of the stomach versus less likely gastritis. Clinical correlation is recommended. Interval placement of a right sided ureteral stent. There is mild fullness of the right renal pelvis with urothelial enhancement. Correlation with urinalysis recommended to exclude UTI. Excreted intravenous contrast versus a nonobstructing 4 mm right renal upper pole calculus. Electronically Signed   By: Elgie Collard M.D.   On: 05/20/2016 02:21   Dg Chest Port 1 View  05/20/2016  CLINICAL DATA:  82 year old male with shortness of breath EXAM: PORTABLE CHEST 1 VIEW COMPARISON:  Chest radiograph dated 03/22/2016 FINDINGS: Single portable view of the chest demonstrates mild cardiomegaly with increased central vascular prominence compatible with mild congestive changes. There is no focal consolidation, pleural effusion, or pneumothorax. Median sternotomy wires and CABG vascular clips. No acute osseous  pathology. IMPRESSION: Mild cardiomegaly with mild congestion.  No focal consolidation. Electronically Signed   By: Elgie Collard M.D.   On: 05/20/2016 00:37   Dg Chest Port 1v Same Day  05/20/2016  CLINICAL DATA:  82 year old male with central line placement. EXAM: PORTABLE CHEST 1 VIEW COMPARISON:  Chest radiograph dated 05/20/2016 FINDINGS: There has been interval placement of a right IJ central venous catheter with tip over central SVC. No pneumothorax. There is cardiomegaly with mild congestive changes and mild bronchiectasis. No focal consolidation, pleural effusion, or pneumothorax. The mass now male wires and CABG vascular clips. No acute osseous pathology. IMPRESSION: Right IJ central line with tip over central SVC.  No pneumothorax. Electronically Signed   By: Elgie Collard M.D.   On: 05/20/2016 04:39        Scheduled Meds: . antiseptic oral rinse  7 mL Mouth Rinse BID  . aspirin EC  81 mg Oral QPM  . aztreonam  1 g Intravenous Q8H  . dutasteride  0.5 mg Oral QPM  .  heparin subcutaneous  5,000 Units Subcutaneous Q8H  . insulin aspart  0-15 Units Subcutaneous TID WC  . insulin aspart  0-5 Units Subcutaneous QHS  . levofloxacin (LEVAQUIN) IV  500 mg Intravenous Q48H  . pantoprazole  40 mg Oral Daily  . sodium chloride flush  3 mL Intravenous Q12H  . vancomycin  1,000 mg Intravenous Q24H   Continuous Infusions: . 0.9 % NaCl with KCl 20 mEq / L 100 mL/hr at 05/20/16 1756  . phenylephrine (NEO-SYNEPHRINE) Adult infusion Stopped (05/20/16 0900)     LOS: 1 day    Time spent: 40 minutes    Elliot Cousin, MD [facility] Pager [phone]  If 7PM-7AM, please contact night-coverage [email or web address] Password [email or web address] 05/21/2016, 8:10 AM

## 2016-05-21 NOTE — Progress Notes (Addendum)
[facility] notified of loss of iv access due to infiltration of 2 sites and of multiple unsuccessful attempts to restart piv. New orders received.

## 2016-05-21 NOTE — Progress Notes (Signed)
*  PRELIMINARY RESULTS* Echocardiogram 2D Echocardiogram has been performed.  [name], [name] 05/21/2016, 11:16 AM

## 2016-05-21 NOTE — Progress Notes (Signed)
Pharmacy Antibiotic Note  [name] is a 82 y.o. male admitted on 05/19/2016 with sepsis.  Patient initially started on vancomycin, aztreonam, and levaquin.  BC positive for MSSA.  Change to ancef.  Plan: Ancef 2gm IV q12 hours F/u renal function, cultures and clinical course  Height: 5\' 7"  (170.2 cm) Weight: 188 lb 0.8 oz (85.3 kg) IBW/kg (Calculated) : 66.1  Temp (24hrs), Avg:100.5 F (38.1 C), Min:97.5 F (36.4 C), Max:102.9 F (39.4 C)   Recent Labs Lab 05/19/16 2348 05/20/16 0007 05/20/16 0302 05/20/16 0734 05/21/16 0440 05/21/16 0755  WBC 11.7*  --   --  27.2*  --  15.8*  CREATININE 1.61*  --   --  1.64* 1.64*  --   LATICACIDVEN  --  2.62* 3.42*  --   --  2.2*    Estimated Creatinine Clearance: 27.5 mL/min (by C-G formula based on Cr of 1.64).    Allergies  Allergen Reactions  . Acetaminophen-Codeine     Unknown reaction-not listed on current MAR  . Advil [Ibuprofen] Other (See Comments)    Unknown reaction  . Penicillins     Unknown reaction-not listed on current MAR    Antimicrobials this admission: levaquin 6/23 >>6/24 vanc 6/23  >> 6/24 Aztreonam 6/23>>6/24 Cefazolin  6/24>>   Thank you for allowing pharmacy to be a part of this patient's care.  Talbert CageSeay, Brizeida Mcmurry Poteet 05/21/2016 9:56 AM

## 2016-05-21 NOTE — Consult Note (Addendum)
[facility]    Date of Admission:  05/19/2016           Day 2 vancomycin        Day 2 aztreonam        Day 2 levofloxacin       Reason for Consult: Automatic consultation for MSSA bacteremia    Referring Physician: Dr. Elliot Cousin  Principal Problem:   Staphylococcus aureus bacteremia with sepsis [facility]) Active Problems:   Septic shock (HCC)   Syncope and collapse   Diabetes mellitus type 2 with complications (HCC)   CORONARY ATHEROSCLEROSIS NATIVE CORONARY ARTERY   Chronic atrial fibrillation (HCC)   Kidney stone   Hydroureteronephrosis   UTI (lower urinary tract infection)   Chronic diastolic CHF (congestive heart failure) (HCC)   Pressure ulcer   Thrombocytopenia (HCC)   Acute respiratory failure with hypoxia (HCC)   Elevated troponin   CKD (chronic kidney disease) stage 4, GFR 15-29 ml/min (HCC)   HTN (hypertension)   Dyslipidemia   Normocytic anemia   . antiseptic oral rinse  7 mL Mouth Rinse BID  . aspirin EC  81 mg Oral QPM  . aztreonam  1 g Intravenous Q8H  . dutasteride  0.5 mg Oral QPM  . enoxaparin (LOVENOX) injection  30 mg Subcutaneous Q24H  . gabapentin  100 mg Oral QHS  . insulin aspart  0-15 Units Subcutaneous TID WC  . insulin aspart  0-5 Units Subcutaneous QHS  . levofloxacin (LEVAQUIN) IV  500 mg Intravenous Q48H  . pantoprazole  40 mg Oral Daily  . pindolol  2.5 mg Oral BID  . sodium chloride flush  3 mL Intravenous Q12H  . vancomycin  1,000 mg Intravenous Q24H    Recommendations: 1. Narrow antibiotic therapy to cefazolin 2. Repeat blood cultures 3. Agree with transthoracic echocardiogram 4. Consider goals of care discussion   Assessment: [name] has MSSA bacteremia. This is certainly more likely to be the cause of his syncope and sepsis than a simple Proteus UTI. He had Proteus in his urine during his past hospitalization in April. That organism was susceptible to cefazolin. He appeared to have  tolerated ceftriaxone therapy during that admission. I will narrow antibiotic therapy now to IV cefazolin to cover both MSSA and Proteus.    HPI: [name] is a 82 y.o. male who was found down on the floor unresponsive at his skilled nursing facility and admitted 48 hours ago. He has been febrile. Admission blood cultures have grown MSSA. The source is unclear. He has pyuria but his urine culture has grown Proteus. He has no radiographic evidence of pneumonia. Abdominal CT scan did not show any acute abnormalities.   Review of Systems: Review of Systems  Unable to perform ROS Constitutional:       This is a remote consultation so no review of systems could be obtained.    Past Medical History  Diagnosis Date  . Coronary atherosclerosis of native coronary artery     Multivessel status post CABG  . Mixed hyperlipidemia   . Type 2 diabetes mellitus (HCC)   . Atrial fibrillation (HCC)   . Essential hypertension, benign   . Sleep apnea   . Hydroureteronephrosis 03/22/2016  . CKD (chronic kidney disease)     Social History  Substance Use Topics  . Smoking status: Former Smoker    Start date: 11/28/1944    Quit date: 11/29/1963  . Smokeless  tobacco: Never Used  . Alcohol Use: No    Family History  Problem Relation Age of Onset  . Diabetes     Allergies  Allergen Reactions  . Acetaminophen-Codeine     Unknown reaction-not listed on current MAR  . Advil [Ibuprofen] Other (See Comments)    Unknown reaction  . Penicillins     Unknown reaction-not listed on current MAR    OBJECTIVE: Blood pressure 125/68, pulse 119, temperature 102.9 F (39.4 C), temperature source Oral, resp. rate 25, height 5\' 7"  (1.702 m), weight 188 lb 0.8 oz (85.3 kg), SpO2 96 %.  Physical Exam  Constitutional:  This is a remote consultation so no physical exam could be obtained.    Lab Results Lab Results  Component Value Date   WBC 15.8* 05/21/2016   HGB 10.8* 05/21/2016   HCT 31.8*  05/21/2016   MCV 96.7 05/21/2016   PLT 76* 05/21/2016    Lab Results  Component Value Date   CREATININE 1.64* 05/21/2016   BUN 35* 05/21/2016   NA 134* 05/21/2016   K 3.7 05/21/2016   CL 104 05/21/2016   CO2 22 05/21/2016    Lab Results  Component Value Date   ALT 13* 05/21/2016   AST 25 05/21/2016   ALKPHOS 49 05/21/2016   BILITOT 0.8 05/21/2016     Microbiology: Recent Results (from the past 240 hour(s))  Urine culture     Status: Abnormal (Preliminary result)   Collection Time: 05/19/16 11:59 PM  Result Value Ref Range Status   Specimen Description URINE, CLEAN CATCH  Final   Special Requests NONE  Final   Culture 80,000 COLONIES/mL PROTEUS MIRABILIS (A)  Final   Report Status PENDING  Incomplete  Blood Culture (routine x 2)     Status: None (Preliminary result)   Collection Time: 05/20/16 12:08 AM  Result Value Ref Range Status   Specimen Description BLOOD RIGHT HAND  Final   Special Requests BOTTLES DRAWN AEROBIC AND ANAEROBIC 6CC EACH  Final   Culture  Setup Time   Final    GRAM POSITIVE COCCI IN CLUSTERS AEROBIC BOTTLE ONLY Gram Stain Report Called to,Read Back By and Verified With: SPANGLER E. AT 1145A ON [id number] BY THOMPSON S. CRITICAL RESULT CALLED TO, READ BACK BY AND VERIFIED WITH: E SPANGLER,PHARMD AT 1740 05/20/16 BY Elson ClanM WILSON Performed at [facility]     Culture PENDING  Incomplete   Report Status PENDING  Incomplete  Blood Culture ID Panel (Reflexed)     Status: Abnormal   Collection Time: 05/20/16 12:08 AM  Result Value Ref Range Status   Enterococcus species NOT DETECTED NOT DETECTED Final   Vancomycin resistance NOT DETECTED NOT DETECTED Final   Listeria monocytogenes NOT DETECTED NOT DETECTED Final   Staphylococcus species DETECTED (A) NOT DETECTED Final    Comment: CRITICAL RESULT CALLED TO, READ BACK BY AND VERIFIED WITH: Desma MaximE. Spangler RN 17:40 05/20/16 ([email or web address])    Staphylococcus aureus DETECTED (A) NOT DETECTED Final    Comment: Desma MaximE.  Spangler RN 17:40 05/20/16 ([email or web address])   Methicillin resistance NOT DETECTED NOT DETECTED Final   Streptococcus species NOT DETECTED NOT DETECTED Final   Streptococcus agalactiae NOT DETECTED NOT DETECTED Final   Streptococcus pneumoniae NOT DETECTED NOT DETECTED Final   Streptococcus pyogenes NOT DETECTED NOT DETECTED Final   Acinetobacter baumannii NOT DETECTED NOT DETECTED Final   Enterobacteriaceae species NOT DETECTED NOT DETECTED Final   Enterobacter cloacae complex NOT DETECTED NOT DETECTED Final   Escherichia coli NOT  DETECTED NOT DETECTED Final   Klebsiella oxytoca NOT DETECTED NOT DETECTED Final   Klebsiella pneumoniae NOT DETECTED NOT DETECTED Final   Proteus species NOT DETECTED NOT DETECTED Final   Serratia marcescens NOT DETECTED NOT DETECTED Final   Carbapenem resistance NOT DETECTED NOT DETECTED Final   Haemophilus influenzae NOT DETECTED NOT DETECTED Final   Neisseria meningitidis NOT DETECTED NOT DETECTED Final   Pseudomonas aeruginosa NOT DETECTED NOT DETECTED Final   Candida albicans NOT DETECTED NOT DETECTED Final   Candida glabrata NOT DETECTED NOT DETECTED Final   Candida krusei NOT DETECTED NOT DETECTED Final   Candida parapsilosis NOT DETECTED NOT DETECTED Final   Candida tropicalis NOT DETECTED NOT DETECTED Final  Blood Culture (routine x 2)     Status: None (Preliminary result)   Collection Time: 05/20/16 12:20 AM  Result Value Ref Range Status   Specimen Description BLOOD RIGHT ANTECUBITAL  Final   Special Requests BOTTLES DRAWN AEROBIC ONLY 2CC  Final   Culture  Setup Time   Final    GRAM POSITIVE COCCI Gram Stain Report Called to,Read Back By and Verified With: CHAPEL,R. AT 1305 ON 05/20/2016 BY AGUNDIZ,E. AEROBIC BOTTLE ONLY Performed at [facility] Performed at [facility] [place]    Culture NO GROWTH 1 DAY  Final   Report Status PENDING  Incomplete  MRSA PCR Screening     Status: None   Collection Time: 05/20/16  4:47 AM  Result Value Ref  Range Status   MRSA by PCR NEGATIVE NEGATIVE Final    Comment:        The GeneXpert MRSA Assay (FDA approved for NASAL specimens only), is one component of a comprehensive MRSA colonization surveillance program. It is not intended to diagnose MRSA infection nor to guide or monitor treatment for MRSA infections.     Cliffton AstersJohn Rishawn Walck, MD [facility] [phone] pager   [phone] 05/21/2016, 9:37 AM

## 2016-05-22 ENCOUNTER — Inpatient Hospital Stay (HOSPITAL_COMMUNITY): Payer: Medicare Other

## 2016-05-22 DIAGNOSIS — N10 Acute tubulo-interstitial nephritis: Secondary | ICD-10-CM

## 2016-05-22 DIAGNOSIS — N131 Hydronephrosis with ureteral stricture, not elsewhere classified: Secondary | ICD-10-CM

## 2016-05-22 LAB — CULTURE, BLOOD (ROUTINE X 2)

## 2016-05-22 LAB — CBC
HEMATOCRIT: 30.9 % — AB (ref 39.0–52.0)
Hemoglobin: 10.4 g/dL — ABNORMAL LOW (ref 13.0–17.0)
MCH: 32.3 pg (ref 26.0–34.0)
MCHC: 33.7 g/dL (ref 30.0–36.0)
MCV: 96 fL (ref 78.0–100.0)
PLATELETS: 72 10*3/uL — AB (ref 150–400)
RBC: 3.22 MIL/uL — AB (ref 4.22–5.81)
RDW: 14 % (ref 11.5–15.5)
WBC: 15.2 10*3/uL — ABNORMAL HIGH (ref 4.0–10.5)

## 2016-05-22 LAB — GLUCOSE, CAPILLARY
GLUCOSE-CAPILLARY: 144 mg/dL — AB (ref 65–99)
GLUCOSE-CAPILLARY: 149 mg/dL — AB (ref 65–99)
GLUCOSE-CAPILLARY: 165 mg/dL — AB (ref 65–99)
GLUCOSE-CAPILLARY: 168 mg/dL — AB (ref 65–99)

## 2016-05-22 LAB — URINE CULTURE: Culture: 80000 — AB

## 2016-05-22 LAB — BASIC METABOLIC PANEL
Anion gap: 6 (ref 5–15)
BUN: 43 mg/dL — AB (ref 6–20)
CALCIUM: 7.9 mg/dL — AB (ref 8.9–10.3)
CO2: 21 mmol/L — AB (ref 22–32)
CREATININE: 1.81 mg/dL — AB (ref 0.61–1.24)
Chloride: 105 mmol/L (ref 101–111)
GFR calc Af Amer: 35 mL/min — ABNORMAL LOW (ref >60)
GFR calc non Af Amer: 30 mL/min — ABNORMAL LOW (ref >60)
GLUCOSE: 133 mg/dL — AB (ref 65–99)
Potassium: 3.9 mmol/L (ref 3.5–5.1)
Sodium: 132 mmol/L — ABNORMAL LOW (ref 135–145)

## 2016-05-22 LAB — TROPONIN I: Troponin I: 0.24 ng/mL — ABNORMAL HIGH (ref <0.031)

## 2016-05-22 LAB — BRAIN NATRIURETIC PEPTIDE: B Natriuretic Peptide: 491 pg/mL — ABNORMAL HIGH (ref 0.0–100.0)

## 2016-05-22 MED ORDER — PINDOLOL 5 MG PO TABS
2.5000 mg | ORAL_TABLET | Freq: Every day | ORAL | Status: DC
  Administered 2016-05-23 – 2016-05-24 (×2): 2.5 mg via ORAL
  Filled 2016-05-22 (×3): qty 1

## 2016-05-22 MED ORDER — DICLOFENAC SODIUM 1 % TD GEL
2.0000 g | Freq: Two times a day (BID) | TRANSDERMAL | Status: DC
  Administered 2016-05-22 – 2016-05-24 (×5): 2 g via TOPICAL
  Filled 2016-05-22 (×2): qty 100

## 2016-05-22 MED ORDER — DICLOFENAC SODIUM 1 % TD GEL: 4.0000 g | Freq: Two times a day (BID) | TRANSDERMAL | Status: DC

## 2016-05-22 MED ORDER — GABAPENTIN 100 MG PO CAPS
100.0000 mg | ORAL_CAPSULE | Freq: Two times a day (BID) | ORAL | Status: DC | PRN
  Administered 2016-05-23 – 2016-05-24 (×2): 100 mg via ORAL
  Filled 2016-05-22 (×2): qty 1

## 2016-05-22 NOTE — Progress Notes (Addendum)
PROGRESS NOTE    [name]  ZOX:[id number] DOB: [birth year] DOA: 05/19/2016 PCP: [name], MD   Brief Narrative:  Patient is a 82 year old man with a history of right hydronephrosis, status post stent April 2017, chronic atrial fibrillation, chronic diastolic congestive heart failure, and diabetes mellitus, who presented to the ED after being found down and unresponsive at the ALF. Reportedly, he had several episodes of emesis. In the ED, he was found to be febrile, tachycardic, tachypnea, hypotensive, and hypoxic. His lab data were significant for a creatinine of 1.61, troponin I of 0.07, lactic acid of 262, BNP of 709, WBC of 11.7, and platelet count of 111. His urinalysis revealed too numerous to count WBCs. He was admitted for further evaluation and management of septic shock and UTI.  Blood cultures were positive for MSSA and the urine culture was positive for Proteus. Remotely, ID Dr. Orvan Falconerampbell recommended cefazolin to cover both. 2-D echocardiogram revealed no obvious vegetations.   Assessment & Plan:   Principal Problem:   Staphylococcus aureus bacteremia with sepsis (HCC) Active Problems:   UTI (lower urinary tract infection)   Syncope and collapse   Septic shock (HCC)   Acute respiratory failure with hypoxia (HCC)   Elevated troponin   Diabetes mellitus type 2 with complications (HCC)   CORONARY ATHEROSCLEROSIS NATIVE CORONARY ARTERY   Chronic atrial fibrillation (HCC)   Kidney stone   Hydroureteronephrosis   Chronic diastolic CHF (congestive heart failure) (HCC)   Pressure ulcer   Thrombocytopenia (HCC)   CKD (chronic kidney disease) stage 4, GFR 15-29 ml/min (HCC)   HTN (hypertension)   Dyslipidemia   Normocytic anemia   1. Septic shock/lactic acidosis associated with MSSA bacteremia and Proteus UTI. On admission, the patient was febrile with a temperature of greater than 103. His white blood cell count was 11.7, but it increased to 27.2.  Blood cultures and a  urine culture ordered on admission. -Patient was started on broad-spectrum antibiotics with vancomycin, aztreonam, and Levaquin. Neo-Synephrine had to be started and was given for approx. 24 hours before it was discontinued. IV Solu-Cortef was given 24 hours. -His blood cultures grew out MSSA and urine culture grew out Proteus. Dr. Orvan Falconerampbell remotely narrowed antibiotic therapy to cefazolin which covered both bacteria. -Patient's WBC is trending downward; fever curve is trending downward. -We'll ask Dr. Orvan Falconerampbell to opine on the duration of antibiotic therapy.  Urinary tract infection, likely polynephritis secondary to Proteus. History of right hydronephrosis status post ureteral stent 03/17/16. Urine culture is growing Proteus which it did in 02/2006. Patient was started on antibiotics as above. Apparently the EDP discussed the patient with urology on-call on admission and there was no plan to remove the stent. Therapy narrowed to cefazolin per ID Dr. Orvan Falconerampbell.  Acute respiratory failure with hypoxia. Patient's oxygen saturation was in the 80s intermittently in the ED. However, on 1.5 L of oxygen, his ABG revealed PO2 of 85. The transient hypoxia was likely from septic shock. We'll continue oxygen supplementation as needed.  Elevated troponin I in a patient with CAD/status post CABG. Patient troponin I was 0.07 on admission, but has trended up to 0.27.EKG revealed atrial fibrillation with right bundle branch block. The patient denies chest pain. Elevation is likely from demand ischemia as a consequence of sepsis. -He is treated chronically with pindolol and aspirin. Aspirin was continued. Pindolol restarted with caution; will decrease the dose to once daily from twice a day. -His echo revealed an EF of 55-60% and no wall  motion abnormality.  Chronic atrial fibrillation. Patient is treated with aspirin and pindolol. His rate was elevated on on admission due to sepsis. -Pindolol was restarted at a  lower dose. His heart rate has improved.  Chronic diastolic heart failure. Patient is treated with Lasix. Lasix is on hold secondary to treatment of septic shock. -2-D echocardiogram revealed an EF of 55-60%; diastolic function was unable to be assessed secondary to atrial fibrillation.  Stage III to stage IV chronic kidney disease. Patient's baseline creatinine appears to be 1.8, but has ranged from 1.5-2.1 over the past 6 months. Creatinine was 1.61 on admission. It has trended up to 1.8. Urine output is nonoliguric. Will continue gentle IV fluids. Continue Foley catheter for strict ins and outs for now.  Thrombocytopenia. Patient's platelet count was 111 on admission. He was started on subcutaneous heparin for DVT prophylaxis with caution. -Heparin was discontinued on 6/24 and Lovenox started. -Due to continued decrease in his platelet count, will discontinue Lovenox and start SCDs. -His vitamin B12 level and TSH were within normal limits.  Type 2 diabetes mellitus with nephropathy. Patient is treated with Januvia and metformin chronically. Both are on hold. Sliding scale NovoLog was started. His CBGs have been controlled. His hemoglobin A1c was 6.6.  Right leg cramping We'll start Voltaren twice a day and give gabapentin twice a day when necessary. Will order PT evaluation.    DVT prophylaxis: Subcutaneous heparin>> Lovenox-d/c'd >> SCDs Code Status: Full code Family Communication: Family not available Disposition Plan: Discharge back to ALF, but patient may need SNF when clinically stable for discharge.   Consultants:   None  Procedures:  PICC line pending  Echo 05/21/16: Study Conclusions - Left ventricle: The cavity size was normal. Wall thickness was  increased in a pattern of moderate LVH. Systolic function was  normal. The estimated ejection fraction was in the range of 55%  to 60%. Wall motion was normal; there were no regional wall  motion abnormalities. The  study was not technically sufficient to  allow evaluation of LV diastolic dysfunction due to atrial  fibrillation. - Aortic valve: Valve area (VTI): 1.7 cm^2. Valve area (Vmax): 1.73  cm^2. - Mitral valve: Moderately calcified annulus. Mildly thickened  leaflets . There was mild regurgitation. - Left atrium: The atrium was moderately dilated. - Right ventricle: RV poorly visualized, grossly appears at least  moderately enlarged. Systolic function was moderately to severely  reduced. TAPSE: 10 mm . - Right atrium: The atrium was severely dilated. - Pulmonary arteries: PASP is at lest moderately elevated at  . Cannot estimate RA pressure, IVC not visualized. Technically difficult study.    Antimicrobials:  Cefazolin 05/21/16 >>  Vancomycin 05/20/16>> 05/21/16  Aztreonam 05/20/16>> 05/21/16  Levaquin 05/20/16>> 05/21/16   Subjective: Patient complains of right leg cramping. Nursing reports loss of IV access overnight. PICC line pending.  Objective: Filed Vitals:   05/22/16 0628 05/22/16 0700 05/22/16 0830 05/22/16 0900  BP:  102/89 100/59 106/64  Pulse: 70 82 81 77  Temp: 98.4 F (36.9 C) 98.6 F (37 C) 98.4 F (36.9 C) 98.2 F (36.8 C)  TempSrc:      Resp: 18 26 23 23   Height:      Weight:      SpO2: 100% 100% 94% 99%    Intake/Output Summary (Last 24 hours) at 05/22/16 0953 Last data filed at 05/22/16 0600  Gross per 24 hour  Intake   1552 ml  Output    850 ml  Net    702 ml   Filed Weights   05/19/16 2359 05/20/16 0459 05/21/16 0400  Weight: 82.101 kg (181 lb) 82.5 kg (181 lb 14.1 oz) 85.3 kg (188 lb 0.8 oz)    Examination:  General exam: 82 year old elderly Caucasian man in no acute distress. Respiratory system: A few bibasilar crackles. Respiratory effort normal. Cardiovascular system: Irregular, irregular with a soft systolic murmur and borderline tachycardia. No JVD, murmurs, rubs, gallops or clicks. No pedal edema. Gastrointestinal system:  Abdomen is nondistended, soft and nontender. No organomegaly or masses felt. Normal bowel sounds heard. Central nervous system: Alert and oriented. No focal neurological deficits, but very hard of hearing. Extremities: Symmetric. Possible spasm right hamstring; attempted to massage it out. Skin: No rashes, lesions or ulcers Psychiatry: Judgement and insight appear normal. Pleasant affect. Speech is clear. He is very hard of hearing.    Data Reviewed: I have personally reviewed following labs and imaging studies  CBC:  Recent Labs Lab 05/19/16 2348 05/20/16 0734 05/21/16 0755 05/22/16 0421  WBC 11.7* 27.2* 15.8* 15.2*  NEUTROABS 10.8*  --   --   --   HGB 12.5* 10.8* 10.8* 10.4*  HCT 37.3* 32.4* 31.8* 30.9*  MCV 96.6 98.5 96.7 96.0  PLT 111* 100* 76* 72*   Basic Metabolic Panel:  Recent Labs Lab 05/19/16 2348 05/20/16 0734 05/21/16 0440 05/22/16 0421  NA 135 134* 134* 132*  K 3.8 3.6 3.7 3.9  CL 102 102 104 105  CO2 23 24 22  21*  GLUCOSE 202* 188* 123* 133*  BUN 31* 32* 35* 43*  CREATININE 1.61* 1.64* 1.64* 1.81*  CALCIUM 9.0 8.0* 8.0* 7.9*   GFR: Estimated Creatinine Clearance: 24.9 mL/min (by C-G formula based on Cr of 1.81). Liver Function Tests:  Recent Labs Lab 05/19/16 2348 05/20/16 0734 05/21/16 0440  AST 26 28 25   ALT 15* 14* 13*  ALKPHOS 72 56 49  BILITOT 1.0 1.2 0.8  PROT 8.0 6.4* 6.0*  ALBUMIN 4.3 3.3* 3.0*   No results for input(s): LIPASE, AMYLASE in the last 168 hours. No results for input(s): AMMONIA in the last 168 hours. Coagulation Profile: No results for input(s): INR, PROTIME in the last 168 hours. Cardiac Enzymes:  Recent Labs Lab 05/19/16 2348 05/21/16 0440 05/22/16 0421  TROPONINI 0.07* 0.27* 0.24*   BNP (last 3 results) No results for input(s): PROBNP in the last 8760 hours. HbA1C:  Recent Labs  05/20/16 0734  HGBA1C 6.6*   CBG:  Recent Labs Lab 05/21/16 0758 05/21/16 1132 05/21/16 1601 05/21/16 2159  05/22/16 0752  GLUCAP 125* 123* 135* 169* 144*   Lipid Profile: No results for input(s): CHOL, HDL, LDLCALC, TRIG, CHOLHDL, LDLDIRECT in the last 72 hours. Thyroid Function Tests:  Recent Labs  05/20/16 0734  TSH 1.136   Anemia Panel:  Recent Labs  05/20/16 0734  VITAMINB12 260  TIBC 203*  IRON 9*   Sepsis Labs:  Recent Labs Lab 05/20/16 0007 05/20/16 0302 05/21/16 0755  LATICACIDVEN 2.62* 3.42* 2.2*    Recent Results (from the past 240 hour(s))  Urine culture     Status: Abnormal   Collection Time: 05/19/16 11:59 PM  Result Value Ref Range Status   Specimen Description URINE, CLEAN CATCH  Final   Special Requests NONE  Final   Culture 80,000 COLONIES/mL PROTEUS MIRABILIS (A)  Final   Report Status 05/22/2016 FINAL  Final   Organism ID, Bacteria PROTEUS MIRABILIS (A)  Final      Susceptibility  Proteus mirabilis - MIC*    AMPICILLIN >=32 RESISTANT Resistant     CEFAZOLIN <=4 SENSITIVE Sensitive     CEFTRIAXONE <=1 SENSITIVE Sensitive     CIPROFLOXACIN <=0.25 SENSITIVE Sensitive     GENTAMICIN <=1 SENSITIVE Sensitive     IMIPENEM 1 SENSITIVE Sensitive     NITROFURANTOIN 128 RESISTANT Resistant     TRIMETH/SULFA <=20 SENSITIVE Sensitive     AMPICILLIN/SULBACTAM <=2 SENSITIVE Sensitive     PIP/TAZO <=4 SENSITIVE Sensitive     * 80,000 COLONIES/mL PROTEUS MIRABILIS  Blood Culture (routine x 2)     Status: Abnormal   Collection Time: 05/20/16 12:08 AM  Result Value Ref Range Status   Specimen Description BLOOD RIGHT HAND  Final   Special Requests BOTTLES DRAWN AEROBIC AND ANAEROBIC 6CC EACH  Final   Culture  Setup Time   Final    GRAM POSITIVE COCCI IN CLUSTERS AEROBIC BOTTLE ONLY Gram Stain Report Called to,Read Back By and Verified With: SPANGLER E. AT 1145A ON [id number] BY THOMPSON S. CRITICAL RESULT CALLED TO, READ BACK BY AND VERIFIED WITH: E SPANGLER,PHARMD AT 1740 05/20/16 BY Elson Clan Performed at [facility]    Culture STAPHYLOCOCCUS AUREUS (A)   Final   Report Status 05/22/2016 FINAL  Final   Organism ID, Bacteria STAPHYLOCOCCUS AUREUS  Final      Susceptibility   Staphylococcus aureus - MIC*    CIPROFLOXACIN >=8 RESISTANT Resistant     ERYTHROMYCIN >=8 RESISTANT Resistant     GENTAMICIN <=0.5 SENSITIVE Sensitive     OXACILLIN 0.5 SENSITIVE Sensitive     TETRACYCLINE <=1 SENSITIVE Sensitive     VANCOMYCIN 1 SENSITIVE Sensitive     TRIMETH/SULFA <=10 SENSITIVE Sensitive     CLINDAMYCIN <=0.25 SENSITIVE Sensitive     RIFAMPIN <=0.5 SENSITIVE Sensitive     Inducible Clindamycin NEGATIVE Sensitive     * STAPHYLOCOCCUS AUREUS  Blood Culture ID Panel (Reflexed)     Status: Abnormal   Collection Time: 05/20/16 12:08 AM  Result Value Ref Range Status   Enterococcus species NOT DETECTED NOT DETECTED Final   Vancomycin resistance NOT DETECTED NOT DETECTED Final   Listeria monocytogenes NOT DETECTED NOT DETECTED Final   Staphylococcus species DETECTED (A) NOT DETECTED Final    Comment: CRITICAL RESULT CALLED TO, READ BACK BY AND VERIFIED WITH: Desma Maxim RN 17:40 05/20/16 ([email or web address])    Staphylococcus aureus DETECTED (A) NOT DETECTED Final    Comment: Desma Maxim RN 17:40 05/20/16 ([email or web address])   Methicillin resistance NOT DETECTED NOT DETECTED Final   Streptococcus species NOT DETECTED NOT DETECTED Final   Streptococcus agalactiae NOT DETECTED NOT DETECTED Final   Streptococcus pneumoniae NOT DETECTED NOT DETECTED Final   Streptococcus pyogenes NOT DETECTED NOT DETECTED Final   Acinetobacter baumannii NOT DETECTED NOT DETECTED Final   Enterobacteriaceae species NOT DETECTED NOT DETECTED Final   Enterobacter cloacae complex NOT DETECTED NOT DETECTED Final   Escherichia coli NOT DETECTED NOT DETECTED Final   Klebsiella oxytoca NOT DETECTED NOT DETECTED Final   Klebsiella pneumoniae NOT DETECTED NOT DETECTED Final   Proteus species NOT DETECTED NOT DETECTED Final   Serratia marcescens NOT DETECTED NOT DETECTED Final   Carbapenem  resistance NOT DETECTED NOT DETECTED Final   Haemophilus influenzae NOT DETECTED NOT DETECTED Final   Neisseria meningitidis NOT DETECTED NOT DETECTED Final   Pseudomonas aeruginosa NOT DETECTED NOT DETECTED Final   Candida albicans NOT DETECTED NOT DETECTED Final   Candida glabrata NOT DETECTED NOT DETECTED Final  Candida krusei NOT DETECTED NOT DETECTED Final   Candida parapsilosis NOT DETECTED NOT DETECTED Final   Candida tropicalis NOT DETECTED NOT DETECTED Final  Blood Culture (routine x 2)     Status: Abnormal   Collection Time: 05/20/16 12:20 AM  Result Value Ref Range Status   Specimen Description BLOOD RIGHT ANTECUBITAL  Final   Special Requests BOTTLES DRAWN AEROBIC ONLY 2CC  Final   Culture  Setup Time   Final    GRAM POSITIVE COCCI Gram Stain Report Called to,Read Back By and Verified With: CHAPEL,R. AT 1305 ON 05/20/2016 BY AGUNDIZ,E. AEROBIC BOTTLE ONLY Performed at [facility]    Culture (A)  Final    STAPHYLOCOCCUS AUREUS SUSCEPTIBILITIES PERFORMED ON PREVIOUS CULTURE WITHIN THE LAST 5 DAYS. [facility]    Report Status 05/22/2016 FINAL  Final  MRSA PCR Screening     Status: None   Collection Time: 05/20/16  4:47 AM  Result Value Ref Range Status   MRSA by PCR NEGATIVE NEGATIVE Final    Comment:        The GeneXpert MRSA Assay (FDA approved for NASAL specimens only), is one component of a comprehensive MRSA colonization surveillance program. It is not intended to diagnose MRSA infection nor to guide or monitor treatment for MRSA infections.   Culture, blood (routine x 2)     Status: None (Preliminary result)   Collection Time: 05/21/16 10:08 AM  Result Value Ref Range Status   Specimen Description BLOOD BLOOD RIGHT HAND  Final   Special Requests BOTTLES DRAWN AEROBIC AND ANAEROBIC 6CC  Final   Culture  Setup Time   Final    GRAM POSITIVE COCCI Gram Stain Report Called to,Read Back By and Verified With: SMITH,K AT 1159 ON  05/21/2016 BY AGUNDIZ,E. IN BOTH AEROBIC AND ANAEROBIC BOTTLES    Culture   Final    GRAM POSITIVE COCCI CULTURE REINCUBATED FOR BETTER GROWTH Performed at [facility]    Report Status PENDING  Incomplete  Culture, blood (routine x 2)     Status: None (Preliminary result)   Collection Time: 05/21/16 10:17 AM  Result Value Ref Range Status   Specimen Description BLOOD  Final   Special Requests NONE  Final   Culture  Setup Time   Final    GRAM POSITIVE COCCI Gram Stain Report Called to,Read Back By and Verified With: SMITH,K. AT 1159 ON 05/21/2016 BY AGUNDIZ,E. IN BOTH AEROBIC AND ANAEROBIC BOTTLES    Culture   Final    GRAM POSITIVE COCCI CULTURE REINCUBATED FOR BETTER GROWTH [facility]    Report Status PENDING  Incomplete         Radiology Studies: No results found.      Scheduled Meds: . antiseptic oral rinse  7 mL Mouth Rinse BID  . aspirin EC  81 mg Oral QPM  .  ceFAZolin (ANCEF) IV  2 g Intravenous Q12H  . dutasteride  0.5 mg Oral QPM  . enoxaparin (LOVENOX) injection  30 mg Subcutaneous Q24H  . gabapentin  100 mg Oral QHS  . insulin aspart  0-15 Units Subcutaneous TID WC  . insulin aspart  0-5 Units Subcutaneous QHS  . pantoprazole  40 mg Oral Daily  . pindolol  2.5 mg Oral BID  . sodium chloride flush  3 mL Intravenous Q12H   Continuous Infusions: . 0.9 % NaCl with KCl 20 mEq / L Stopped (05/21/16 1930)     LOS: 2  days    Time spent: 35 minutes    Elliot Cousin, MD [facility] Pager [phone]  If 7PM-7AM, please contact night-coverage [email or web address] Password [facility] 05/22/2016, 9:53 AM

## 2016-05-22 NOTE — Progress Notes (Signed)
Dr. Sherrie MustacheFisher ordered patient's foley to remain in, she will reassess the foley tomorrow.

## 2016-05-22 NOTE — Progress Notes (Signed)
(  Delayed entry).  Patient's family, namely his daughter Mrs. Delight HohBrookshire was updated regarding the medical course thus far. She was informed of the infections including bacteremia ("blood infection") and kidney infection. I discussed CODE STATUS with her. She informed me that he had been a DO NOT RESUSCITATE in the past. She confirmed it with paperwork she reviewed at home; this was also his desire. Therefore, she requested that he be made a DNR. I also asked her if she or the patient would want to pursue invasive diagnostic testing and she said that the patient would not want that, but she was in agreement with continuing IV antibiotics as recommended. Palliative care has been consulted which she was in agreement with.  I also discussed the course of antibiotic treatment with Dr. Orvan Falconerampbell. Please see his note.

## 2016-05-22 NOTE — Progress Notes (Addendum)
Patient ID: Kyle CardGeorge W Sortor, male   DOB: August 18, 1918, 82 y.o.   MRN: [id number]         [facility]    Date of Admission:  05/19/2016   Total days of antibiotics 3        Day 2 cefazolin  [name] has MSSA bacteremia with blood cultures positive on 6/23 and 6/24. He also has Proteus in his urine culture that is probably an insignificant colonizer. However, both organisms are susceptible to cefazolin. There was no evidence found for endocarditis on a transthoracic echocardiogram but the study was deemed "technically difficult". I will repeat blood cultures today and continue cefazolin. I discussed the situation this morning with Dr. Elliot Cousinenise Fisher who has spoken to the patient and his family. They have requested DO NOT RESUSCITATE status and would like to continue medical management while avoiding aggressive, invasive procedures. He lost IV access last night and had to have a PICC placed. This means the PICC may of been placed when he was still bacteremic. I recommend not proceeding with transesophageal echocardiogram in light of the family's wishes. He may need lithotripsy as well. I would recommend proceeding with lithotripsy while he is on cefazolin. Since his situation is already somewhat complicated I recommend treating with cefazolin for a total of 4 weeks through 06/17/2016. I will sign off now but please call if I can be of further assistance.         Cliffton AstersJohn Tekeya Geffert, MD [facility] [phone] pager   [phone] cell 12/01/2015, 1:32 PM

## 2016-05-22 NOTE — Progress Notes (Signed)
Pt alert with periods of confused conversations. PiCC line to right upper arm intact. Foley intact. VSS. No complaints of any distress. Report given to B.Rod CanBrower, Charity fundraiserN. Pt to be transferred via bed to room 314. Family visiting at bedside.

## 2016-05-23 ENCOUNTER — Encounter (HOSPITAL_COMMUNITY): Payer: Self-pay | Admitting: Primary Care

## 2016-05-23 ENCOUNTER — Inpatient Hospital Stay (HOSPITAL_COMMUNITY): Payer: Medicare Other

## 2016-05-23 DIAGNOSIS — Z7189 Other specified counseling: Secondary | ICD-10-CM | POA: Insufficient documentation

## 2016-05-23 DIAGNOSIS — Z515 Encounter for palliative care: Secondary | ICD-10-CM

## 2016-05-23 LAB — DIFFERENTIAL
Basophils Absolute: 0 10*3/uL (ref 0.0–0.1)
Basophils Relative: 0 %
EOS ABS: 0.1 10*3/uL (ref 0.0–0.7)
EOS PCT: 1 %
LYMPHS ABS: 0.8 10*3/uL (ref 0.7–4.0)
LYMPHS PCT: 8 %
MONOS PCT: 11 %
Monocytes Absolute: 1.1 10*3/uL — ABNORMAL HIGH (ref 0.1–1.0)
NEUTROS PCT: 80 %
Neutro Abs: 8.1 10*3/uL — ABNORMAL HIGH (ref 1.7–7.7)

## 2016-05-23 LAB — RETICULOCYTES
RBC.: 2.94 MIL/uL — ABNORMAL LOW (ref 4.22–5.81)
Retic Count, Absolute: 29.4 10*3/uL (ref 19.0–186.0)
Retic Ct Pct: 1 % (ref 0.4–3.1)

## 2016-05-23 LAB — BASIC METABOLIC PANEL
Anion gap: 6 (ref 5–15)
BUN: 42 mg/dL — AB (ref 6–20)
CALCIUM: 7.9 mg/dL — AB (ref 8.9–10.3)
CO2: 22 mmol/L (ref 22–32)
CREATININE: 1.49 mg/dL — AB (ref 0.61–1.24)
Chloride: 107 mmol/L (ref 101–111)
GFR calc Af Amer: 44 mL/min — ABNORMAL LOW (ref >60)
GFR, EST NON AFRICAN AMERICAN: 38 mL/min — AB (ref >60)
GLUCOSE: 132 mg/dL — AB (ref 65–99)
Potassium: 3.6 mmol/L (ref 3.5–5.1)
Sodium: 135 mmol/L (ref 135–145)

## 2016-05-23 LAB — GLUCOSE, CAPILLARY
GLUCOSE-CAPILLARY: 136 mg/dL — AB (ref 65–99)
Glucose-Capillary: 129 mg/dL — ABNORMAL HIGH (ref 65–99)
Glucose-Capillary: 168 mg/dL — ABNORMAL HIGH (ref 65–99)
Glucose-Capillary: 116 mg/dL — ABNORMAL HIGH (ref 65–99)

## 2016-05-23 LAB — SEDIMENTATION RATE: SED RATE: 60 mm/h — AB (ref 0–16)

## 2016-05-23 LAB — CBC
HEMATOCRIT: 27.9 % — AB (ref 39.0–52.0)
Hemoglobin: 9.4 g/dL — ABNORMAL LOW (ref 13.0–17.0)
MCH: 32.2 pg (ref 26.0–34.0)
MCHC: 33.7 g/dL (ref 30.0–36.0)
MCV: 95.5 fL (ref 78.0–100.0)
PLATELETS: 65 10*3/uL — AB (ref 150–400)
RBC: 2.92 MIL/uL — ABNORMAL LOW (ref 4.22–5.81)
RDW: 13.1 % (ref 11.5–15.5)
WBC: 10.7 10*3/uL — AB (ref 4.0–10.5)

## 2016-05-23 LAB — FERRITIN: Ferritin: 209 ng/mL (ref 24–336)

## 2016-05-23 LAB — FOLATE: FOLATE: 17 ng/mL (ref >5.9)

## 2016-05-23 LAB — C-REACTIVE PROTEIN: CRP: 16.2 mg/dL — AB (ref <1.0)

## 2016-05-23 LAB — LACTATE DEHYDROGENASE: LDH: 157 U/L (ref 98–192)

## 2016-05-23 NOTE — Consult Note (Signed)
[facility] Consultation Oncology  Name: [name]      MRN: [id number]    Location: A314/A314-01  Date: 05/23/2016 Time:5:35 PM   REFERRING PHYSICIAN:  Rexene Alberts, MD  REASON FOR CONSULT:  Thrombocytopenia   DIAGNOSIS:  Thrombocytopenia with first evidence of low platelets dating back to at least 2013 and normocytic, normochromic anemia dating back to at least 2009.  HISTORY OF PRESENT ILLNESS:   [name] is a 82 yo with a past medical history significant for DM2, CAD, HTN, chronic a-fib, barrett's esophagus, GERD, chronic diastolic CHF, CKD stage IV who is admitted to the hospital for staph bacteremia.  Chart is reviewed.  Medications are reviewed.  It is noted that the patient received heparin on 05/20/2016 with transition to Lovenox on 05/21/2016.  On previous hospitalization in April, I cannot see that the patient received LMWH.  I personally reviewed and went over laboratory results with the patient.  The results are noted within this dictation.  He was admitted with a thrombocytopenia at 111,000 and his platelet count has dropped daily since---100,000---76,000---72,000---65,000.  On further chart review of labs, thrombocytopenia dates back to at least 2013.  Additionally, the patient is noted to have a normocytic, normochromic anemia which will be worked-up as well.  I personally reviewed and went over radiographic studies with the patient.  The results are noted within this dictation.  Most recent CT imaging does not demonstrate any liver abnormalities radiographically, and without splenomegaly.   PAST MEDICAL HISTORY:   Past Medical History  Diagnosis Date  . Coronary atherosclerosis of native coronary artery     Multivessel status post CABG  . Mixed hyperlipidemia   . Type 2 diabetes mellitus ([place])   . Atrial fibrillation ([place])   . Essential hypertension, benign   . Sleep apnea   . Hydroureteronephrosis 03/22/2016  . CKD (chronic kidney disease)      ALLERGIES: Allergies  Allergen Reactions  . Acetaminophen-Codeine     Unknown reaction-not listed on current MAR  . Advil [Ibuprofen] Other (See Comments)    Unknown reaction  . Penicillins     Unknown reaction-not listed on current MAR      MEDICATIONS: I have reviewed the patient's current medications.     PAST SURGICAL HISTORY Past Surgical History  Procedure Laterality Date  . Tonsillectomy    . Cholecystectomy    . Knee arthroscopy    . Hernia repair    . Coronary artery bypass graft  1992    LIMA to LAD and diagonal, SVG to RCA, SVG to OM  . Back surgery    . Esophagogastroduodenoscopy (egd) with esophageal dilation N/A 09/12/2013    Procedure: ESOPHAGOGASTRODUODENOSCOPY (EGD) WITH ESOPHAGEAL DILATION;  Surgeon: Rogene Houston, MD;  Location: [facility] ENDO SUITE;  Service: Endoscopy;  Laterality: N/A;  . Cystoscopy w/ ureteral stent placement Right 03/22/2016    Procedure: CYSTOSCOPY WITH RIGHT RETROGRADE PYELOGRAM, RIGHT URETERAL STENT PLACEMENT;  Surgeon: Franchot Gallo, MD;  Location: [facility] ORS;  Service: Urology;  Laterality: Right;    FAMILY HISTORY: Family History  Problem Relation Age of Onset  . Diabetes      SOCIAL HISTORY:  reports that he quit smoking about 52 years ago. He started smoking about 71 years ago. He has never used smokeless tobacco. He reports that he does not drink alcohol or use illicit drugs.  PERFORMANCE STATUS: The patient's performance status is 3 - Symptomatic, >50% confined to bed  PHYSICAL EXAM: Most Recent Vital Signs:  Blood pressure 129/67, pulse 82, temperature 97.6 F (36.4 C), temperature source Oral, resp. rate 16, height '5\' 7"'  (1.702 m), weight 189 lb 6 oz (85.9 kg), SpO2 96 %. General appearance: alert, cooperative, appears stated age, no distress and in bed with friend at bedside. Head: Normocephalic, without obvious abnormality, atraumatic Eyes: negative findings: lids and lashes normal, conjunctivae and sclerae normal and  corneas clear Throat: normal findings: palate normal and oropharynx pink & moist without lesions or evidence of thrush Lungs: wheezes mild wheezes anteriorly Heart: irregularly irregular rhythm Extremities: multiple ecchymoses without any evidence of petechial rash Skin: Skin color, texture, turgor normal. No rashes or lesions Neurologic: Grossly normal  LABORATORY DATA:  Results for orders placed or performed during the hospital encounter of 05/19/16 (from the past 48 hour(s))  Glucose, capillary     Status: Abnormal   Collection Time: 05/21/16  9:59 PM  Result Value Ref Range   Glucose-Capillary 169 (H) 65 - 99 mg/dL   Comment 1 Notify RN   CBC     Status: Abnormal   Collection Time: 05/22/16  4:21 AM  Result Value Ref Range   WBC 15.2 (H) 4.0 - 10.5 K/uL   RBC 3.22 (L) 4.22 - 5.81 MIL/uL   Hemoglobin 10.4 (L) 13.0 - 17.0 g/dL   HCT 30.9 (L) 39.0 - 52.0 %   MCV 96.0 78.0 - 100.0 fL   MCH 32.3 26.0 - 34.0 pg   MCHC 33.7 30.0 - 36.0 g/dL   RDW 14.0 11.5 - 15.5 %   Platelets 72 (L) 150 - 400 K/uL    Comment: SPECIMEN CHECKED FOR CLOTS PLATELET COUNT CONFIRMED BY SMEAR CORRECTED ON 06/25 AT 0555: PREVIOUSLY REPORTED AS 72   Basic metabolic panel     Status: Abnormal   Collection Time: 05/22/16  4:21 AM  Result Value Ref Range   Sodium 132 (L) 135 - 145 mmol/L   Potassium 3.9 3.5 - 5.1 mmol/L   Chloride 105 101 - 111 mmol/L   CO2 21 (L) 22 - 32 mmol/L   Glucose, Bld 133 (H) 65 - 99 mg/dL   BUN 43 (H) 6 - 20 mg/dL   Creatinine, Ser 1.81 (H) 0.61 - 1.24 mg/dL   Calcium 7.9 (L) 8.9 - 10.3 mg/dL   GFR calc non Af Amer 30 (L) >60 mL/min   GFR calc Af Amer 35 (L) >60 mL/min    Comment: (NOTE) The eGFR has been calculated using the CKD EPI equation. This calculation has not been validated in all clinical situations. eGFR's persistently <60 mL/min signify possible Chronic Kidney Disease.    Anion gap 6 5 - 15  Troponin I     Status: Abnormal   Collection Time: 05/22/16  4:21  AM  Result Value Ref Range   Troponin I 0.24 (H) <0.031 ng/mL    Comment:        PERSISTENTLY INCREASED TROPONIN VALUES IN THE RANGE OF 0.04-0.49 ng/mL CAN BE SEEN IN:       -UNSTABLE ANGINA       -CONGESTIVE HEART FAILURE       -MYOCARDITIS       -CHEST TRAUMA       -ARRYHTHMIAS       -LATE PRESENTING MYOCARDIAL INFARCTION       -COPD   CLINICAL FOLLOW-UP RECOMMENDED.   Brain natriuretic peptide     Status: Abnormal   Collection Time: 05/22/16  4:21 AM  Result Value Ref Range   B Natriuretic Peptide  491.0 (H) 0.0 - 100.0 pg/mL  Glucose, capillary     Status: Abnormal   Collection Time: 05/22/16  7:52 AM  Result Value Ref Range   Glucose-Capillary 144 (H) 65 - 99 mg/dL   Comment 1 Notify RN    Comment 2 Document in Chart   Glucose, capillary     Status: Abnormal   Collection Time: 05/22/16 11:46 AM  Result Value Ref Range   Glucose-Capillary 149 (H) 65 - 99 mg/dL  Culture, blood (routine x 2)     Status: None (Preliminary result)   Collection Time: 05/22/16 12:26 PM  Result Value Ref Range   Specimen Description BLOOD PICC LINE    Special Requests BOTTLES DRAWN AEROBIC AND ANAEROBIC 6CC EACH    Culture NO GROWTH < 24 HOURS    Report Status PENDING   Culture, blood (routine x 2)     Status: None (Preliminary result)   Collection Time: 05/22/16 12:30 PM  Result Value Ref Range   Specimen Description BLOOD RIGHT HAND    Special Requests BOTTLES DRAWN AEROBIC AND ANAEROBIC [place] EACH    Culture NO GROWTH < 24 HOURS    Report Status PENDING   Glucose, capillary     Status: Abnormal   Collection Time: 05/22/16  4:05 PM  Result Value Ref Range   Glucose-Capillary 165 (H) 65 - 99 mg/dL   Comment 1 Notify RN    Comment 2 Document in Chart   Glucose, capillary     Status: Abnormal   Collection Time: 05/22/16 10:15 PM  Result Value Ref Range   Glucose-Capillary 168 (H) 65 - 99 mg/dL   Comment 1 Notify RN    Comment 2 Document in Chart   CBC     Status: Abnormal    Collection Time: 05/23/16  6:26 AM  Result Value Ref Range   WBC 10.7 (H) 4.0 - 10.5 K/uL   RBC 2.92 (L) 4.22 - 5.81 MIL/uL   Hemoglobin 9.4 (L) 13.0 - 17.0 g/dL   HCT 27.9 (L) 39.0 - 52.0 %   MCV 95.5 78.0 - 100.0 fL   MCH 32.2 26.0 - 34.0 pg   MCHC 33.7 30.0 - 36.0 g/dL   RDW 13.1 11.5 - 15.5 %   Platelets 65 (L) 150 - 400 K/uL    Comment: SPECIMEN CHECKED FOR CLOTS CONSISTENT WITH PREVIOUS RESULT   Basic metabolic panel     Status: Abnormal   Collection Time: 05/23/16  6:26 AM  Result Value Ref Range   Sodium 135 135 - 145 mmol/L   Potassium 3.6 3.5 - 5.1 mmol/L   Chloride 107 101 - 111 mmol/L   CO2 22 22 - 32 mmol/L   Glucose, Bld 132 (H) 65 - 99 mg/dL   BUN 42 (H) 6 - 20 mg/dL   Creatinine, Ser 1.49 (H) 0.61 - 1.24 mg/dL   Calcium 7.9 (L) 8.9 - 10.3 mg/dL   GFR calc non Af Amer 38 (L) >60 mL/min   GFR calc Af Amer 44 (L) >60 mL/min    Comment: (NOTE) The eGFR has been calculated using the CKD EPI equation. This calculation has not been validated in all clinical situations. eGFR's persistently <60 mL/min signify possible Chronic Kidney Disease.    Anion gap 6 5 - 15  Glucose, capillary     Status: Abnormal   Collection Time: 05/23/16  8:04 AM  Result Value Ref Range   Glucose-Capillary 129 (H) 65 - 99 mg/dL   Comment 1 Notify  RN   Differential     Status: Abnormal   Collection Time: 05/23/16 11:12 AM  Result Value Ref Range   Neutrophils Relative % 80 %   Neutro Abs 8.1 (H) 1.7 - 7.7 K/uL   Lymphocytes Relative 8 %   Lymphs Abs 0.8 0.7 - 4.0 K/uL   Monocytes Relative 11 %   Monocytes Absolute 1.1 (H) 0.1 - 1.0 K/uL   Eosinophils Relative 1 %   Eosinophils Absolute 0.1 0.0 - 0.7 K/uL   Basophils Relative 0 %   Basophils Absolute 0.0 0.0 - 0.1 K/uL  Glucose, capillary     Status: Abnormal   Collection Time: 05/23/16 11:43 AM  Result Value Ref Range   Glucose-Capillary 168 (H) 65 - 99 mg/dL   Comment 1 Notify RN   Glucose, capillary     Status: Abnormal    Collection Time: 05/23/16  4:37 PM  Result Value Ref Range   Glucose-Capillary 116 (H) 65 - 99 mg/dL   Comment 1 Notify RN       RADIOGRAPHY: Dg Chest 1 View  05/22/2016  CLINICAL DATA:  PICC adjustment EXAM: CHEST 1 VIEW COMPARISON:  Chest radiograph from earlier today. FINDINGS: Right PICC terminates in the lower third of the superior vena cava. Sternotomy wires appear aligned and intact. CABG clips overlie the mediastinum. Stable cardiomediastinal silhouette with mild cardiomegaly. No pneumothorax. No pleural effusion. No overt pulmonary edema. Mild bibasilar scarring versus atelectasis. IMPRESSION: 1. Right PICC terminates in the lower third of the superior vena cava. 2. Stable mild cardiomegaly without overt pulmonary edema. 3. Mild bibasilar scarring versus atelectasis. Electronically Signed   By: Ilona Sorrel M.D.   On: 05/22/2016 13:50   Dg Chest 1 View  05/22/2016  CLINICAL DATA:  PICC line placement EXAM: CHEST 1 VIEW COMPARISON:  05/20/2016 FINDINGS: Cardiomegaly again noted. There is right arm PICC line with tip in right atrium. No pneumothorax. Mild perihilar interstitial prominence suspicious for mild interstitial edema. No segmental infiltrate. Status post CABG free IMPRESSION: Right arm PICC line with tip in right atrium. No pneumothorax. Mild perihilar interstitial prominence suspicious for mild interstitial edema. Electronically Signed   By: Lahoma Crocker M.D.   On: 05/22/2016 12:06   Dg Hip Port Unilat With Pelvis 1v Right  05/23/2016  CLINICAL DATA:  Hypersensitive to touch right lower extremity. Denies recent injury or fall. Right hip pain with difficulty straightening length. EXAM: DG HIP (WITH OR WITHOUT PELVIS) 1V PORT RIGHT COMPARISON:  CT 05/20/2016 FINDINGS: There mild symmetric degenerative changes of the hips. There is no acute fracture or dislocation. Fusion hardware intact over the lumbosacral spine. Partially visualized right internal ureteral stent which appears in  adequate position. Small caliber rectal catheter present. IMPRESSION: No acute findings. Mild symmetric degenerative change of the hips. Electronically Signed   By: Marin Olp M.D.   On: 05/23/2016 13:51       PATHOLOGY:  None  ASSESSMENT/PLAN:  Thrombocytopenia, dating back to at least 2013 without radiographical evidence of splenomegaly or cirrhosis of liver.  Platelet count since hospitalization has declined.  Of note, he was given heparin 05/20/2016 with transition to prophylaxis dosing of Lovenox on 05/21/2016.  Lovenox was discontinued on 05/22/2016 due to progressive thrombocytopenia. Hematology was consulted as a result.  Previous hospitalization in April was reviewed.  I cannot see that he was given LMWH.  HIT panel has been sent. His pre-score value (4Ts) is low probability for HIT.   Current medications are reviewed.  LMWH has been discontinued as described.  Other possible medication culprit is cefazolin.  Would not recommend any medication changes at this time.  Orders placed: hepatitis panel, HIV antibody, SPEP+IFE, IgG, IgA, IgM.  I will also order a serotonin release assay as this test has been argued to be more specific with regards to evaluating HIT.  Will monitor from the periphery.  No further hematologic recommendations at this time.  Longstanding mild thrombocytopenia may be immune mediated, medication mediated. Recent fall in platelet counts may also be from bacteremia.   Normocytic normochromic anemia dating back many years in the setting of Stage III-IV chronic renal disease. B12 is WNL.  Iron studies demonstrate an anemia of chronic disease-like picture.   Labs ordered: folate, ferritin, haptoglobin, retic count, EPO, LDH, ESR, CRP, pathology smear review, SPEP+IFE.  Doy Mince 05/23/2016 5:58 PM   Patient seen and examined.  Medical issues as defined above. Assessment and plan as detailed, reflects work performed by myself with the assistance of [employer].   Would hold additional LMWH, wait for HIT panel. Patient may have multiple reasons for thrombocytopenia. If counts continue to fall additional recommendations will be made. Anemia most likely secondary to CKD. Will follow up with additional studies as ordered. Donald Pore MD

## 2016-05-23 NOTE — NC FL2 (Signed)
[place] MEDICAID FL2 LEVEL OF CARE SCREENING TOOL     IDENTIFICATION  Patient Name: [name] Birthdate: [birth year] Sex: male Admission Date (Current Location): 05/19/2016  [facility] [place]   Facility and Address:  [facility],  [address], [place] [id number]      Provider Number: [id number]  Attending Physician Name and Address:  Elliot Cousinenise Fisher, MD  Relative Name and Phone Number:       Current Level of Care: Hospital Recommended Level of Care: Skilled Nursing Facility Prior Approval Number:    Date Approved/Denied:   PASRR Number:    Discharge Plan: Other (Comment) (ALF)    Current Diagnoses: Patient Active Problem List   Diagnosis Date Noted  . CKD (chronic kidney disease) stage 4, GFR 15-29 ml/min (HCC) 05/21/2016  . HTN (hypertension) 05/21/2016  . Dyslipidemia 05/21/2016  . Normocytic anemia 05/21/2016  . Septic shock (HCC) 05/20/2016  . Pressure ulcer 05/20/2016  . Syncope and collapse 05/20/2016  . Thrombocytopenia (HCC) 05/20/2016  . Acute respiratory failure with hypoxia (HCC) 05/20/2016  . Elevated troponin 05/20/2016  . Staphylococcus aureus bacteremia with sepsis (HCC) 05/20/2016  . Kidney stone 03/22/2016  . Hydroureteronephrosis 03/22/2016  . Acute pyelonephritis 03/22/2016  . Hyperkalemia 03/22/2016  . Chronic diastolic CHF (congestive heart failure) (HCC) 03/22/2016  . Barrett's esophagus 10/10/2013  . GERD (gastroesophageal reflux disease) 10/10/2013  . Dysphagia, unspecified(787.20) 08/13/2013  . SPINAL STENOSIS 02/15/2010  . CORONARY ATHEROSCLEROSIS NATIVE CORONARY ARTERY 10/31/2008  . Chronic atrial fibrillation (HCC) 10/31/2008  . SCIATICA 07/28/2008  . KNEE, ARTHRITIS, DEGEN./OSTEO 06/11/2008  . DERANGEMENT MENISCUS 06/11/2008  . KNEE PAIN, RIGHT 06/11/2008  . Diabetes mellitus type 2 with complications (HCC) 06/10/2008    Orientation RESPIRATION BLADDER Height & Weight     Self,  Place  O2 (2 L) Indwelling catheter Weight: 189 lb 6 oz (85.9 kg) Height:  5\' 7"  (170.2 cm)  BEHAVIORAL SYMPTOMS/MOOD NEUROLOGICAL BOWEL NUTRITION STATUS  Other (Comment) (n/a)  (n/a) Incontinent Diet (Carb modified dysphagia 3)  AMBULATORY STATUS COMMUNICATION OF NEEDS Skin   Extensive Assist Verbally Bruising, Other (Comment) (Stage I to buttocks with foam dressing. )                       Personal Care Assistance Level of Assistance  Bathing, Feeding, Dressing Bathing Assistance: Maximum assistance Feeding assistance: Limited assistance Dressing Assistance: Maximum assistance     Functional Limitations Info  Sight, Hearing, Speech Sight Info: Adequate Hearing Info: Impaired Speech Info: Adequate    SPECIAL CARE FACTORS FREQUENCY                       Contractures Contractures Info: Not present    Additional Factors Info  Insulin Sliding Scale Code Status Info: Full code Allergies Info: Acetaminophen-codeine, Advil, Penicillins           Current Medications (05/23/2016):  This is the current hospital active medication list Current Facility-Administered Medications  Medication Dose Route Frequency Provider Last Rate Last Dose  . 0.9 % NaCl with KCl 20 mEq/ L  infusion   Intravenous Continuous Elliot Cousinenise Fisher, MD 40 mL/hr at 05/23/16 [phone]    . acetaminophen (TYLENOL) tablet 650 mg  650 mg Oral Q6H PRN Houston SirenPeter Le, MD   650 mg at 05/22/16 [phone]   Or  . acetaminophen (TYLENOL) suppository 650 mg  650 mg Rectal Q6H PRN Houston SirenPeter Le, MD      .  antiseptic oral rinse (CPC / CETYLPYRIDINIUM CHLORIDE 0.05%) solution 7 mL  7 mL Mouth Rinse BID Elliot Cousinenise Fisher, MD   7 mL at 05/23/16 1000  . aspirin EC tablet 81 mg  81 mg Oral QPM Houston SirenPeter Le, MD   81 mg at 05/22/16 1811  . ceFAZolin (ANCEF) IVPB 2g/100 mL premix  2 g Intravenous Q12H Elliot Cousinenise Fisher, MD   2 g at 05/23/16 0902  . diclofenac sodium (VOLTAREN) 1 % transdermal gel 2 g  2 g Topical BID Elliot Cousinenise Fisher, MD   2 g at 05/23/16  0902  . dutasteride (AVODART) capsule 0.5 mg  0.5 mg Oral QPM Houston SirenPeter Le, MD   0.5 mg at 05/22/16 1811  . gabapentin (NEURONTIN) capsule 100 mg  100 mg Oral QHS Elliot Cousinenise Fisher, MD   100 mg at 05/22/16 2232  . gabapentin (NEURONTIN) capsule 100 mg  100 mg Oral BID PRN Elliot Cousinenise Fisher, MD      . insulin aspart (novoLOG) injection 0-15 Units  0-15 Units Subcutaneous TID WC Elliot Cousinenise Fisher, MD   2 Units at 05/23/16 0901  . insulin aspart (novoLOG) injection 0-5 Units  0-5 Units Subcutaneous QHS Elliot Cousinenise Fisher, MD   0 Units at 05/20/16 2133  . pantoprazole (PROTONIX) EC tablet 40 mg  40 mg Oral Daily Houston SirenPeter Le, MD   40 mg at 05/23/16 0903  . pindolol (VISKEN) tablet 2.5 mg  2.5 mg Oral Daily Elliot Cousinenise Fisher, MD   2.5 mg at 05/23/16 0903  . sodium chloride flush (NS) 0.9 % injection 3 mL  3 mL Intravenous Q12H Houston SirenPeter Le, MD   3 mL at 05/22/16 2200     Discharge Medications: Please see discharge summary for a list of discharge medications.  Relevant Imaging Results:  Relevant Lab Results:   Additional Information    Milanni Ayub, Juleen ChinaHeather D, LCSW

## 2016-05-23 NOTE — Progress Notes (Addendum)
PROGRESS NOTE    [name]  RUE:[id number] DOB: 1918-10-21 DOA: 05/19/2016 PCP: Dwana Melena, MD   Brief Narrative:  Patient is a 82 year old man with a history of right hydronephrosis, status post stent April 2017, chronic atrial fibrillation, chronic diastolic congestive heart failure, and diabetes mellitus, who presented to the ED after being found down and unresponsive at the ALF. Reportedly, he had several episodes of emesis. In the ED, he was found to be febrile, tachycardic, tachypnea, hypotensive, and hypoxic. His lab data were significant for a creatinine of 1.61, troponin I of 0.07, lactic acid of 262, BNP of 709, WBC of 11.7, and platelet count of 111. His urinalysis revealed too numerous to count WBCs. He was admitted for further evaluation and management of septic shock and UTI.  Blood cultures were positive for MSSA and the urine culture was positive for Proteus. Remotely, ID Dr. Orvan Falconer started cefazolin to cover both. 2-D echocardiogram revealed no obvious vegetations. The family did not want invasive diagnostic evaluation and Dr. Orvan Falconer felt it was reasonable not to do the TEE, but would treat the bacteremia with 4 weeks of cefazolin.   Assessment & Plan:   Principal Problem:   Staphylococcus aureus bacteremia with sepsis [facility]) Active Problems:   Acute pyelonephritis   Syncope and collapse   Septic shock (HCC)   Acute respiratory failure with hypoxia (HCC)   Elevated troponin   Diabetes mellitus type 2 with complications (HCC)   CORONARY ATHEROSCLEROSIS NATIVE CORONARY ARTERY   Chronic atrial fibrillation (HCC)   Kidney stone   Hydroureteronephrosis   Chronic diastolic CHF (congestive heart failure) (HCC)   Pressure ulcer   Thrombocytopenia (HCC)   CKD (chronic kidney disease) stage 4, GFR 15-29 ml/min (HCC)   HTN (hypertension)   Dyslipidemia   Normocytic anemia   1. Septic shock/lactic acidosis associated with MSSA bacteremia and Proteus UTI. On  admission, the patient was febrile with a temperature of greater than 103. His white blood cell count was 11.7, but it increased to 27.2.  Blood cultures and a urine culture ordered on admission. -Patient was started on broad-spectrum antibiotics with vancomycin, aztreonam, and Levaquin. Neo-Synephrine had to be started and was given for approx. 24 hours before it was discontinued. IV Solu-Cortef was given 24 hours. -His blood cultures grew out MSSA and urine culture grew out Proteus. ID, Dr. Orvan Falconer remotely narrowed antibiotic therapy to cefazolin which covered both bacteria. Discussed further with Dr. Orvan Falconer. The family did not desire invasive procedures like TEE, therefore, he will be treated empirically with 4 weeks of IV cefazolin. -Patient is [facility] is trending downward. His fever has resolved. -Another set of blood cultures ordered on 05/22/16 with results pending.  Urinary tract infection, likely polynephritis secondary to Proteus. History of right hydronephrosis status post ureteral stent 03/17/16. Urine culture is growing Proteus which it did in 02/2006. Patient was started on antibiotics as above. Apparently the EDP discussed the patient with urology on-call on admission and there was no plan to remove the stent. Therapy narrowed to cefazolin per ID Dr. Orvan Falconer. Dr. Orvan Falconer recommends that if lithotripsy is planned, that it be done while the patient is on IV cefazolin. -We'll attempt to discuss further with urology prior to discharge.  Acute respiratory failure with hypoxia. Patient's oxygen saturation was in the 80s intermittently in the ED. However, on 1.5 L of oxygen, his ABG revealed PO2 of 85. The transient hypoxia was likely from septic shock. His oxygen saturations have improved. We'll continue  oxygen supplementation as needed.  Elevated troponin I in a patient with CAD/status post CABG. Patient troponin I was 0.07 on admission, but has trended up to 0.27.EKG revealed atrial  fibrillation with right bundle branch block. The patient denies chest pain. Elevation is likely from demand ischemia as a consequence of sepsis. -He is treated chronically with pindolol and aspirin. Aspirin was continued. Pindolol was restarted with caution but the dose was decreased from twice a day to daily.  -His echo revealed an EF of 55-60% and no wall motion abnormality.  Chronic atrial fibrillation. Patient is treated with aspirin and pindolol. His rate was elevated on on admission due to sepsis. -Pindolol was restarted at a lower dose. His heart rate has improved.  Chronic diastolic heart failure. Patient is treated with Lasix. Lasix is on hold secondary to treatment of septic shock. -2-D echocardiogram revealed an EF of 55-60%; diastolic function was unable to be assessed secondary to atrial fibrillation.  Stage III to stage IV chronic kidney disease. Patient's baseline creatinine appears to be 1.8, but has ranged from 1.5-2.1 over the past 6 months. Creatinine was 1.61 on admission. It had trended up to 1.8. Urine output is nonoliguric.  -His creatinine has improved and is trending down. Will continue gentle IV fluids.  - Will discontinue Foley catheter  Thrombocytopenia. Patient's platelet count was 111 on admission. He was started on subcutaneous heparin for DVT prophylaxis with caution. -Heparin was discontinued on 05/21/16 and Lovenox started. -Due to continued decrease in his platelet count,  Lovenox was discontinued on 05/22/16 and start SCDs. -His vitamin B12 level and TSH were within normal limits. -We'll order differential with a blood smear and HIT panel. Will consult hematology.  Type 2 diabetes mellitus with nephropathy. Patient is treated with Januvia and metformin chronically. Both are on hold. Sliding scale NovoLog was started. His CBGs have been controlled. His hemoglobin A1c was 6.6.  Right leg cramping  Voltaren was started twice a day and added gabapentin twice  a day when necessary. Leg cramping has resolved for now. - Will order PT evaluation.    DVT prophylaxis: Subcutaneous heparin>> Lovenox-d/c'd >> SCDs Code Status: Full code Family Communication: Family not available Disposition Plan: Discharge back to ALF, but patient may need SNF when clinically stable for discharge.   Consultants:   Hematology pending  Procedures:  PICC line right upper extremity on 05/22/16  Echo 05/21/16: Study Conclusions - Left ventricle: The cavity size was normal. Wall thickness was  increased in a pattern of moderate LVH. Systolic function was  normal. The estimated ejection fraction was in the range of 55%  to 60%. Wall motion was normal; there were no regional wall  motion abnormalities. The study was not technically sufficient to  allow evaluation of LV diastolic dysfunction due to atrial  fibrillation. - Aortic valve: Valve area (VTI): 1.7 cm^2. Valve area (Vmax): 1.73  cm^2. - Mitral valve: Moderately calcified annulus. Mildly thickened  leaflets . There was mild regurgitation. - Left atrium: The atrium was moderately dilated. - Right ventricle: RV poorly visualized, grossly appears at least  moderately enlarged. Systolic function was moderately to severely  reduced. TAPSE: 10 mm . - Right atrium: The atrium was severely dilated. - Pulmonary arteries: PASP is at lest moderately elevated at  . Cannot estimate RA pressure, IVC not visualized. Technically difficult study.    Antimicrobials:  Cefazolin 05/21/16 >>  Vancomycin 05/20/16>> 05/21/16  Aztreonam 05/20/16>> 05/21/16  Levaquin 05/20/16>> 05/21/16   Subjective: Hard of  hearing patient who denies leg cramping currently. He is lamenting about something he saw on TV. He denies chest pain, shortness of breath, or abdominal pain.  Objective: Filed Vitals:   05/22/16 1451 05/22/16 2131 05/23/16 0601 05/23/16 0627  BP: 116/55 124/67 120/60   Pulse: 71 95 85   Temp:  98.6  F (37 C) 98.2 F (36.8 C)   TempSrc:  Oral Oral   Resp: 18 18 18    Height:      Weight:    85.9 kg (189 lb 6 oz)  SpO2: 98% 97% 97%     Intake/Output Summary (Last 24 hours) at 05/23/16 0935 Last data filed at 05/23/16 0558  Gross per 24 hour  Intake 501.25 ml  Output   1851 ml  Net -1349.75 ml   Filed Weights   05/21/16 0400 05/22/16 1404 05/23/16 0627  Weight: 85.3 kg (188 lb 0.8 oz) 87.1 kg (192 lb 0.3 oz) 85.9 kg (189 lb 6 oz)    Examination:  General exam: 82 year old elderly Caucasian man in no acute distress. Respiratory system: A few bibasilar crackles. Respiratory effort normal. Cardiovascular system: Irregular, irregular with a soft systolic murmur and borderline tachycardia. No JVD, murmurs, rubs, gallops or clicks. No pedal edema. Gastrointestinal system: Abdomen is nondistended, soft and nontender. No organomegaly or masses felt. Normal bowel sounds heard. Central nervous system: Alert and oriented. No focal neurological deficits, but very hard of hearing. Extremities: Symmetric. No acute hot red joints. Skin: No rashes, lesions or ulcers Psychiatry: Judgement and insight appear normal. Pleasant affect. Speech is clear. He is very hard of hearing.    Data Reviewed: I have personally reviewed following labs and imaging studies  CBC:  Recent Labs Lab 05/19/16 2348 05/20/16 0734 05/21/16 0755 05/22/16 0421 05/23/16 0626  WBC 11.7* 27.2* 15.8* 15.2* 10.7*  NEUTROABS 10.8*  --   --   --   --   HGB 12.5* 10.8* 10.8* 10.4* 9.4*  HCT 37.3* 32.4* 31.8* 30.9* 27.9*  MCV 96.6 98.5 96.7 96.0 95.5  PLT 111* 100* 76* 72* 65*   Basic Metabolic Panel:  Recent Labs Lab 05/19/16 2348 05/20/16 0734 05/21/16 0440 05/22/16 0421 05/23/16 0626  NA 135 134* 134* 132* 135  K 3.8 3.6 3.7 3.9 3.6  CL 102 102 104 105 107  CO2 23 24 22  21* 22  GLUCOSE 202* 188* 123* 133* 132*  BUN 31* 32* 35* 43* 42*  CREATININE 1.61* 1.64* 1.64* 1.81* 1.49*  CALCIUM 9.0 8.0*  8.0* 7.9* 7.9*   GFR: Estimated Creatinine Clearance: 30.4 mL/min (by C-G formula based on Cr of 1.49). Liver Function Tests:  Recent Labs Lab 05/19/16 2348 05/20/16 0734 05/21/16 0440  AST 26 28 25   ALT 15* 14* 13*  ALKPHOS 72 56 49  BILITOT 1.0 1.2 0.8  PROT 8.0 6.4* 6.0*  ALBUMIN 4.3 3.3* 3.0*   No results for input(s): LIPASE, AMYLASE in the last 168 hours. No results for input(s): AMMONIA in the last 168 hours. Coagulation Profile: No results for input(s): INR, PROTIME in the last 168 hours. Cardiac Enzymes:  Recent Labs Lab 05/19/16 2348 05/21/16 0440 05/22/16 0421  TROPONINI 0.07* 0.27* 0.24*   BNP (last 3 results) No results for input(s): PROBNP in the last 8760 hours. HbA1C: No results for input(s): HGBA1C in the last 72 hours. CBG:  Recent Labs Lab 05/22/16 0752 05/22/16 1146 05/22/16 1605 05/22/16 2215 05/23/16 0804  GLUCAP 144* 149* 165* 168* 129*   Lipid Profile: No results for  input(s): CHOL, HDL, LDLCALC, TRIG, CHOLHDL, LDLDIRECT in the last 72 hours. Thyroid Function Tests: No results for input(s): TSH, T4TOTAL, FREET4, T3FREE, THYROIDAB in the last 72 hours. Anemia Panel: No results for input(s): VITAMINB12, FOLATE, FERRITIN, TIBC, IRON, RETICCTPCT in the last 72 hours. Sepsis Labs:  Recent Labs Lab 05/20/16 0007 05/20/16 0302 05/21/16 0755  LATICACIDVEN 2.62* 3.42* 2.2*    Recent Results (from the past 240 hour(s))  Urine culture     Status: Abnormal   Collection Time: 05/19/16 11:59 PM  Result Value Ref Range Status   Specimen Description URINE, CLEAN CATCH  Final   Special Requests NONE  Final   Culture 80,000 COLONIES/mL PROTEUS MIRABILIS (A)  Final   Report Status 05/22/2016 FINAL  Final   Organism ID, Bacteria PROTEUS MIRABILIS (A)  Final      Susceptibility   Proteus mirabilis - MIC*    AMPICILLIN >=32 RESISTANT Resistant     CEFAZOLIN <=4 SENSITIVE Sensitive     CEFTRIAXONE <=1 SENSITIVE Sensitive     CIPROFLOXACIN  <=0.25 SENSITIVE Sensitive     GENTAMICIN <=1 SENSITIVE Sensitive     IMIPENEM 1 SENSITIVE Sensitive     NITROFURANTOIN 128 RESISTANT Resistant     TRIMETH/SULFA <=20 SENSITIVE Sensitive     AMPICILLIN/SULBACTAM <=2 SENSITIVE Sensitive     PIP/TAZO <=4 SENSITIVE Sensitive     * 80,000 COLONIES/mL PROTEUS MIRABILIS  Blood Culture (routine x 2)     Status: Abnormal   Collection Time: 05/20/16 12:08 AM  Result Value Ref Range Status   Specimen Description BLOOD RIGHT HAND  Final   Special Requests BOTTLES DRAWN AEROBIC AND ANAEROBIC 6CC EACH  Final   Culture  Setup Time   Final    GRAM POSITIVE COCCI IN CLUSTERS AEROBIC BOTTLE ONLY Gram Stain Report Called to,Read Back By and Verified With: SPANGLER E. AT 1145A ON [id number] BY THOMPSON S. CRITICAL RESULT CALLED TO, READ BACK BY AND VERIFIED WITH: E SPANGLER,PHARMD AT 1740 05/20/16 BY Elson Clan Performed at [facility]    Culture STAPHYLOCOCCUS AUREUS (A)  Final   Report Status 05/22/2016 FINAL  Final   Organism ID, Bacteria STAPHYLOCOCCUS AUREUS  Final      Susceptibility   Staphylococcus aureus - MIC*    CIPROFLOXACIN >=8 RESISTANT Resistant     ERYTHROMYCIN >=8 RESISTANT Resistant     GENTAMICIN <=0.5 SENSITIVE Sensitive     OXACILLIN 0.5 SENSITIVE Sensitive     TETRACYCLINE <=1 SENSITIVE Sensitive     VANCOMYCIN 1 SENSITIVE Sensitive     TRIMETH/SULFA <=10 SENSITIVE Sensitive     CLINDAMYCIN <=0.25 SENSITIVE Sensitive     RIFAMPIN <=0.5 SENSITIVE Sensitive     Inducible Clindamycin NEGATIVE Sensitive     * STAPHYLOCOCCUS AUREUS  Blood Culture ID Panel (Reflexed)     Status: Abnormal   Collection Time: 05/20/16 12:08 AM  Result Value Ref Range Status   Enterococcus species NOT DETECTED NOT DETECTED Final   Vancomycin resistance NOT DETECTED NOT DETECTED Final   Listeria monocytogenes NOT DETECTED NOT DETECTED Final   Staphylococcus species DETECTED (A) NOT DETECTED Final    Comment: CRITICAL RESULT CALLED TO, READ BACK BY  AND VERIFIED WITH: Desma Maxim RN 17:40 05/20/16 ([email or web address])    Staphylococcus aureus DETECTED (A) NOT DETECTED Final    Comment: Desma Maxim RN 17:40 05/20/16 ([email or web address])   Methicillin resistance NOT DETECTED NOT DETECTED Final   Streptococcus species NOT DETECTED NOT DETECTED Final   Streptococcus agalactiae NOT DETECTED NOT  DETECTED Final   Streptococcus pneumoniae NOT DETECTED NOT DETECTED Final   Streptococcus pyogenes NOT DETECTED NOT DETECTED Final   Acinetobacter baumannii NOT DETECTED NOT DETECTED Final   Enterobacteriaceae species NOT DETECTED NOT DETECTED Final   Enterobacter cloacae complex NOT DETECTED NOT DETECTED Final   Escherichia coli NOT DETECTED NOT DETECTED Final   Klebsiella oxytoca NOT DETECTED NOT DETECTED Final   Klebsiella pneumoniae NOT DETECTED NOT DETECTED Final   Proteus species NOT DETECTED NOT DETECTED Final   Serratia marcescens NOT DETECTED NOT DETECTED Final   Carbapenem resistance NOT DETECTED NOT DETECTED Final   Haemophilus influenzae NOT DETECTED NOT DETECTED Final   Neisseria meningitidis NOT DETECTED NOT DETECTED Final   Pseudomonas aeruginosa NOT DETECTED NOT DETECTED Final   Candida albicans NOT DETECTED NOT DETECTED Final   Candida glabrata NOT DETECTED NOT DETECTED Final   Candida krusei NOT DETECTED NOT DETECTED Final   Candida parapsilosis NOT DETECTED NOT DETECTED Final   Candida tropicalis NOT DETECTED NOT DETECTED Final  Blood Culture (routine x 2)     Status: Abnormal   Collection Time: 05/20/16 12:20 AM  Result Value Ref Range Status   Specimen Description BLOOD RIGHT ANTECUBITAL  Final   Special Requests BOTTLES DRAWN AEROBIC ONLY 2CC  Final   Culture  Setup Time   Final    GRAM POSITIVE COCCI Gram Stain Report Called to,Read Back By and Verified With: CHAPEL,R. AT 1305 ON 05/20/2016 BY AGUNDIZ,E. AEROBIC BOTTLE ONLY Performed at [facility]    Culture (A)  Final    STAPHYLOCOCCUS AUREUS SUSCEPTIBILITIES PERFORMED ON  PREVIOUS CULTURE WITHIN THE LAST 5 DAYS. Performed at [facility]    Report Status 05/22/2016 FINAL  Final  MRSA PCR Screening     Status: None   Collection Time: 05/20/16  4:47 AM  Result Value Ref Range Status   MRSA by PCR NEGATIVE NEGATIVE Final    Comment:        The GeneXpert MRSA Assay (FDA approved for NASAL specimens only), is one component of a comprehensive MRSA colonization surveillance program. It is not intended to diagnose MRSA infection nor to guide or monitor treatment for MRSA infections.   Culture, blood (routine x 2)     Status: Abnormal (Preliminary result)   Collection Time: 05/21/16 10:08 AM  Result Value Ref Range Status   Specimen Description BLOOD BLOOD RIGHT HAND  Final   Special Requests BOTTLES DRAWN AEROBIC AND ANAEROBIC 6CC  Final   Culture  Setup Time   Final    GRAM POSITIVE COCCI Gram Stain Report Called to,Read Back By and Verified With: SMITH,K AT 1159 ON 05/21/2016 BY AGUNDIZ,E. IN BOTH AEROBIC AND ANAEROBIC BOTTLES    Culture (A)  Final    STAPHYLOCOCCUS AUREUS SUSCEPTIBILITIES PERFORMED ON PREVIOUS CULTURE WITHIN THE LAST 5 DAYS. Performed at [facility]    Report Status PENDING  Incomplete  Culture, blood (routine x 2)     Status: Abnormal (Preliminary result)   Collection Time: 05/21/16 10:17 AM  Result Value Ref Range Status   Specimen Description BLOOD  Final   Special Requests NONE  Final   Culture  Setup Time   Final    GRAM POSITIVE COCCI Gram Stain Report Called to,Read Back By and Verified With: SMITH,K. AT 1159 ON 05/21/2016 BY AGUNDIZ,E. IN BOTH AEROBIC AND ANAEROBIC BOTTLES    Culture (A)  Final    STAPHYLOCOCCUS AUREUS SUSCEPTIBILITIES PERFORMED ON PREVIOUS CULTURE WITHIN THE LAST 5 DAYS.  Performed at [facility]    Report Status PENDING  Incomplete  Culture, blood (routine x 2)     Status: None (Preliminary result)   Collection Time: 05/22/16 12:26 PM  Result Value Ref Range Status    Specimen Description BLOOD PICC LINE  Final   Special Requests BOTTLES DRAWN AEROBIC AND ANAEROBIC [facility]6CC EACH  Final   Culture PENDING  Incomplete   Report Status PENDING  Incomplete  Culture, blood (routine x 2)     Status: None (Preliminary result)   Collection Time: 05/22/16 12:30 PM  Result Value Ref Range Status   Specimen Description BLOOD RIGHT HAND  Final   Special Requests BOTTLES DRAWN AEROBIC AND ANAEROBIC [facility]7CC EACH  Final   Culture PENDING  Incomplete   Report Status PENDING  Incomplete         Radiology Studies: Dg Chest 1 View  05/22/2016  CLINICAL DATA:  PICC adjustment EXAM: CHEST 1 VIEW COMPARISON:  Chest radiograph from earlier today. FINDINGS: Right PICC terminates in the lower third of the superior vena cava. Sternotomy wires appear aligned and intact. CABG clips overlie the mediastinum. Stable cardiomediastinal silhouette with mild cardiomegaly. No pneumothorax. No pleural effusion. No overt pulmonary edema. Mild bibasilar scarring versus atelectasis. IMPRESSION: 1. Right PICC terminates in the lower third of the superior vena cava. 2. Stable mild cardiomegaly without overt pulmonary edema. 3. Mild bibasilar scarring versus atelectasis. Electronically Signed   By: Delbert PhenixJason A Poff M.D.   On: 05/22/2016 13:50   Dg Chest 1 View  05/22/2016  CLINICAL DATA:  PICC line placement EXAM: CHEST 1 VIEW COMPARISON:  05/20/2016 FINDINGS: Cardiomegaly again noted. There is right arm PICC line with tip in right atrium. No pneumothorax. Mild perihilar interstitial prominence suspicious for mild interstitial edema. No segmental infiltrate. Status post CABG free IMPRESSION: Right arm PICC line with tip in right atrium. No pneumothorax. Mild perihilar interstitial prominence suspicious for mild interstitial edema. Electronically Signed   By: Natasha MeadLiviu  Pop M.D.   On: 05/22/2016 12:06        Scheduled Meds: . antiseptic oral rinse  7 mL Mouth Rinse BID  . aspirin EC  81 mg Oral QPM  .   ceFAZolin (ANCEF) IV  2 g Intravenous Q12H  . diclofenac sodium  2 g Topical BID  . dutasteride  0.5 mg Oral QPM  . gabapentin  100 mg Oral QHS  . insulin aspart  0-15 Units Subcutaneous TID WC  . insulin aspart  0-5 Units Subcutaneous QHS  . pantoprazole  40 mg Oral Daily  . pindolol  2.5 mg Oral Daily  . sodium chloride flush  3 mL Intravenous Q12H   Continuous Infusions: . 0.9 % NaCl with KCl 20 mEq / L 50 mL/hr at 05/23/16 0736     LOS: 3 days    Time spent: 35 minutes    Elliot CousinFISHER,Etoy Mcdonnell, MD [facility] Pager [phone]  If 7PM-7AM, please contact night-coverage [email or web address] Password [email or web address] 05/23/2016, 9:35 AM

## 2016-05-23 NOTE — Consult Note (Signed)
Consultation Note Date: 05/23/2016   Patient Name: [name]  DOB: Jun 01, 1918  MRN: [id number]  Age / Sex: 82 y.o., male  PCP: Benita Stabile, MD Referring Physician: Elliot Cousin, MD  Reason for Consultation: Disposition, Establishing goals of care and Psychosocial/spiritual support  HPI/Patient Profile: 81 y.o. male  with past medical history of CAD, mixed hyperlipidemia, type II diabetes, a field, hypertension, sleep apnea, right ureteral stent placed April 2017 admitted on 05/19/2016 with septic shock.   Clinical Assessment and Goals of Care: [name] is resting quietly in bed with his eyes closed. He wakes easily as I shake his arm. He has no complaints, stating he is resting well. He agrees for me to talk with his daughter [name]. 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 She shares that her preferences for him to return to his ALF, but that if he is unable she would like for him to go to the [facility] for rehab.  We talk about his functional status, and [name] states that he is continually gotten weaker over the last year, talk about his PT evaluation, and his inability to stand at this point.  I share with [name] my concerns about [name]'s functional status, and how long he will be able to stay at [facility], if he is unable to provide for his personal needs/is unable to walk. She states that she will contact the facility, and I share that sometimes [facility] sends staff to evaluate, there's no need for her to call at this time.   [name] talks about his ureteral stenting, kidney stone, and need for lithotripsy. We talk about scheduling outpatient, [name] requests that we schedule this if possible. She shares that she lives over an hour away. I share the possibly [facility] would be able to assist in making this  appointment, as that infectious disease Dr. Orvan Falconer suggests that this be done while he continues 4 weeks of IV antibiotics.  We talk about her his [facility] line placement, and IV antibiotics being administered through home health agency versus in the rehab.  We talk about the benefits of hospice in home after treatment plan is completed. [name] states that [name]'s sister is now under the benefits of hospice. I share the [facility] has a relationship with hospice out of [place]. She shares that she is open to hospice when he is completed this treatment plan.  Healthcare power of attorney Avon Gully,  daughter [name].   SUMMARY OF RECOMMENDATIONS   Continue to treat the treatable, but no extraordinary measures.  Would like to have lithotripsy scheduled, goal is to return to [facility], with [facility] PT and infusion services for antibiotic, and open to hospice on site after treatment completed.  We talk about the possibility of [facility] not being able to manage [name]'s mobility and [name] states that if he must go to a skilled nursing facility for treatment her  choice would be [facility].  Code Status/Advance Care Planning:  DNR- we talk about placing the [place] form in prominent place in [name]'s home.   Symptom Management:   per hospitalist  Palliative Prophylaxis:   Aspiration, Frequent Pain Assessment, Oral Care and Turn Reposition  Additional Recommendations (Limitations, Scope, Preferences):  Continue to treat the treatable, okay with IV antibiotics for 4 weeks, requesting outpatient lithotripsy to manage kidney stone,   Psycho-social/Spiritual:   Desire for further Chaplaincy support:no  Additional Recommendations: Caregiving  Support/Resources and Education on Hospice  Prognosis:   < 6 months , likely based on chronic disease burden, and decreased functional status, to hospital admissions in the last 6 months, bacteremia.  Discharge Planning:  Home to ALF with home health services including PT and infusion services, versus skilled nursing facility for same services. Preference is to return to ALF.      Primary Diagnoses: Present on Admission:  . Septic shock (HCC) . Chronic atrial fibrillation (HCC) . Chronic diastolic CHF (congestive heart failure) (HCC) . Hydroureteronephrosis . Acute pyelonephritis . Diabetes mellitus type 2 with complications (HCC) . Pressure ulcer . Syncope and collapse . Thrombocytopenia (HCC) . Acute respiratory failure with hypoxia (HCC) . Elevated troponin . Staphylococcus aureus bacteremia with sepsis (HCC) . CKD (chronic kidney disease) stage 4, GFR 15-29 ml/min (HCC) . CORONARY ATHEROSCLEROSIS NATIVE CORONARY ARTERY . Normocytic anemia  I have reviewed the medical record, interviewed the patient and family, and examined the patient. The following aspects are pertinent.  Past Medical History  Diagnosis Date  . Coronary atherosclerosis of native coronary artery     Multivessel status post CABG  . Mixed hyperlipidemia   . Type 2 diabetes mellitus (HCC)   . Atrial fibrillation (HCC)   . Essential hypertension, benign   . Sleep apnea   . Hydroureteronephrosis 03/22/2016  . CKD (chronic kidney disease)    Social History   Social History  . Marital Status: Widowed    Spouse Name: N/A  . Number of Children: N/A  . Years of Education: college   Occupational History  . Retired    Social History Main Topics  . Smoking status: Former Smoker    Start date: 11/28/1944    Quit date: 11/29/1963  . Smokeless tobacco: Never Used  . Alcohol Use: No  . Drug Use: No  . Sexual Activity: Not Currently   Other Topics Concern  . None   Social History Narrative   Family History  Problem Relation Age of Onset  . Diabetes     Scheduled Meds: . antiseptic oral rinse  7 mL Mouth Rinse BID  . aspirin EC  81 mg Oral QPM  .  ceFAZolin (ANCEF) IV  2 g Intravenous Q12H  . diclofenac sodium  2 g  Topical BID  . dutasteride  0.5 mg Oral QPM  . gabapentin  100 mg Oral QHS  . insulin aspart  0-15 Units Subcutaneous TID WC  . insulin aspart  0-5 Units Subcutaneous QHS  . pantoprazole  40 mg Oral Daily  . pindolol  2.5 mg Oral Daily  . sodium chloride flush  3 mL Intravenous Q12H   Continuous Infusions: . 0.9 % NaCl with KCl 20 mEq / L 40 mL/hr at 05/23/16 0937   PRN Meds:.acetaminophen **OR** acetaminophen, gabapentin Medications Prior to Admission:  Prior to Admission medications   Medication Sig Start Date End Date Taking? Authorizing Provider  aspirin EC 81 MG tablet Take 81 mg by mouth every  evening.    Yes Historical Provider, MD  beta carotene w/minerals (OCUVITE) tablet Take 1 tablet by mouth every evening.    Yes Historical Provider, MD  dutasteride (AVODART) 0.5 MG capsule Take 0.5 mg by mouth every evening.    Yes Historical Provider, MD  Emollient (EUCERIN) lotion Apply 1 Bottle topically 2 (two) times daily as needed for dry skin.   Yes Historical Provider, MD  furosemide (LASIX) 20 MG tablet Take 1 tablet (20 mg total) by mouth daily. 11/20/15  Yes Kristen N Ward, DO  gabapentin (NEURONTIN) 100 MG capsule Take 100 mg by mouth at bedtime. 09/30/14  Yes Historical Provider, MD  HYDROcodone-acetaminophen (NORCO/VICODIN) 5-325 MG tablet Take 1-2 tablets by mouth every 4 (four) hours as needed for moderate pain. 03/24/16  Yes Meredeth IdeGagan S Lama, MD  metFORMIN (GLUCOPHAGE) 500 MG tablet Take 1 tablet by mouth daily. 04/28/14  Yes Historical Provider, MD  mirabegron ER (MYRBETRIQ) 50 MG TB24 tablet Take 50 mg by mouth daily.   Yes Historical Provider, MD  Multiple Vitamin (DAILY VITE PO) Take 1 tablet by mouth daily.   Yes Historical Provider, MD  omeprazole (PRILOSEC) 40 MG capsule Take 40 mg by mouth daily.   Yes Historical Provider, MD  pindolol (VISKEN) 5 MG tablet Take 2.5 mg by mouth daily.  04/22/14  Yes Historical Provider, MD  sitaGLIPtin (JANUVIA) 100 MG tablet Take 100 mg by  mouth every evening.    Yes Historical Provider, MD   Allergies  Allergen Reactions  . Acetaminophen-Codeine     Unknown reaction-not listed on current MAR  . Advil [Ibuprofen] Other (See Comments)    Unknown reaction  . Penicillins     Unknown reaction-not listed on current MAR   Review of Systems  Unable to perform ROS: Age    Physical Exam  Constitutional: No distress.  Sleeping peacefully as I enter, wakes easily, appears frail  HENT:  Head: Normocephalic and atraumatic.  Cardiovascular: Normal rate.   Pulmonary/Chest: Effort normal. No respiratory distress.  Abdominal: Soft. He exhibits no distension. There is no guarding.  Neurological: He is alert.  Skin: Skin is warm and dry.  Nursing note and vitals reviewed.   Vital Signs: BP 120/60 mmHg  Pulse 85  Temp(Src) 98.2 F (36.8 C) (Oral)  Resp 18  Ht 5\' 7"  (1.702 m)  Wt 85.9 kg (189 lb 6 oz)  BMI 29.65 kg/m2  SpO2 95% Pain Assessment: No/denies pain   Pain Score: 0-No pain   SpO2: SpO2: 95 % O2 Device:SpO2: 95 % O2 Flow Rate: .O2 Flow Rate (L/min): 2 L/min  IO: Intake/output summary:  Intake/Output Summary (Last 24 hours) at 05/23/16 1546 Last data filed at 05/23/16 0558  Gross per 24 hour  Intake 501.25 ml  Output   1400 ml  Net -898.75 ml    LBM: Last BM Date: 05/22/16 Baseline Weight: Weight: 82.101 kg (181 lb) Most recent weight: Weight: 85.9 kg (189 lb 6 oz)     Palliative Assessment/Data:   Flowsheet Rows        Most Recent Value   Intake Tab    Referral Department  Hospitalist   Unit at Time of Referral  Med/Surg Unit   Palliative Care Primary Diagnosis  Sepsis/Infectious Disease   Date Notified  05/22/16   Palliative Care Type  New Palliative care   Reason for referral  Clarify Goals of Care   Date of Admission  05/19/16   Date first seen by Palliative Care  05/23/16   #  of days Palliative referral response time  1 Day(s)   # of days IP prior to Palliative referral  3   Clinical  Assessment    Palliative Performance Scale Score  40%   Pain Max last 24 hours  Not able to report   Pain Min Last 24 hours  Not able to report   Dyspnea Max Last 24 Hours  Not able to report   Dyspnea Min Last 24 hours  Not able to report   Psychosocial & Spiritual Assessment    Palliative Care Outcomes    Patient/Family meeting held?  No   Palliative Care Outcomes  Provided psychosocial or spiritual support   Patient/Family wishes: Interventions discontinued/not started   Mechanical Ventilation   Palliative Care follow-up planned  -- [Follow-up while at [facility]]      Time In: 1445 Time Out: 1535 Time Total: 50 minutes Greater than 50%  of this time was spent counseling and coordinating care related to the above assessment and plan.  Signed by: Katheran Awe, NP   Please contact Palliative Medicine Team phone at [phone] for questions and concerns.  For individual provider: See Loretha Stapler

## 2016-05-23 NOTE — Evaluation (Signed)
Physical Therapy Evaluation Patient Details Name: [name] MRN: [id number] DOB: 1918/01/11 Today's Date: 05/23/2016   History of Present Illness  82 yo M admitted 05/19/2016 due to syncopal episode, and found unresponsive on the floor at his SNF. Pt has also had several episodes of emesis. Dx: Septic shock due to UTI. [place] bacteremia with sepsis. Elevated troponins likely due to demand ischemia as a consequence of sepsis. Pt is from [facility] & ambulates with a walker at baseline. PMH: GERD, CHF, HTN, Afib, CAD s/p CABG, DM2, ureteral stent, sleep apnea, knee arthroscopy, hernia repair, back surgery.  Clinical Impression  Pt received in bed, and was agreeable to PT evaluation.  No family present, but pt states that at baseline he is independent with dressing and bathing, and he uses a Rollator to ambulate.  Pt is known to this PT from previous admission in April 2017.  At that point he ambulate with his rollator x 79ft.  Today during PT evaluation, he was slightly confused - stating he needed to urinate and PT educated him on several occasions that he has a foley catheter in.  Pt also not able to follow commands for MMT today.  He required max A for supine<>sit, and was not able to stand despite 3 attempts from elevated surface.  Pt required Max A for sit<>supine, and it was noted at that time that he had soiled himself.  He required Max A for rolling each direction.  During entire evaluation he stated that he had no pain, however he was hypersensitive to touch on the R LE, and crying out with movement of R LE.  He denied any falls, and states he doesn't know why his hip is hurting.  At this point, he is recommended for SNF due to decline in functional mobility.      Follow Up Recommendations SNF    Equipment Recommendations  None recommended by PT    Recommendations for Other Services       Precautions / Restrictions Precautions Precautions: Fall Precaution Comments: Pt  admitted back in April s/p fall.  Restrictions Weight Bearing Restrictions: No      Mobility  Bed Mobility Overal bed mobility: Needs Assistance Bed Mobility: Supine to Sit;Sit to Supine;Rolling     Supine to sit: Max assist Sit to supine: Max assist;+2 for physical assistance   General bed mobility comments: Pt had difficulty with sequencing task.  Not following commands from PT to move LE's off the EOB, and required assistance to do so.  HOB completely raised for supine<>sit, and bed pad used to assist hips to the EOB.  Upon return to supine, pt required hand over hand technique to get him to place hands correctly for transfer,.  He required Max A for rolling either direction for hygiene after BM, and Max A +2 for supine scoot.    Transfers Overall transfer level:  (Attempted 3 times with bed height elevated, however pt not able to lift buttocks off EOB due to a combination of weakness and R hip pain.  )                  Ambulation/Gait                Stairs            Wheelchair Mobility    Modified Rankin (Stroke Patients Only)       Balance Overall balance assessment: Needs assistance Sitting-balance support: Bilateral upper extremity supported Sitting balance-Leahy Scale:  Fair                                       Pertinent Vitals/Pain Pain Assessment: Faces (Pt states that he does not have any pain) Faces Pain Scale: Hurts whole lot Pain Location: R hip/LE - Pt cries out, and pulls R LE up into FADIR position of the R hip - pt contiues to express he has no pain Pain Descriptors / Indicators: Sharp Pain Intervention(s): Limited activity within patient's tolerance;Monitored during session;Relaxation;Repositioned    Home Living                 Home Equipment: Walker - 4 wheels;Cane - quad;Walker - 2 wheels      Prior Function     Gait / Transfers Assistance Needed: Pt uses a rollator, but states he doesn't believe he  can walk to the dining hall right now, and he doesn't believe in exercise.   ADL's / Homemaking Assistance Needed: Pt states that he is able to dress himself, and states that they do not assist him with bathing, however it is more likely that the staff at his ALF assists him with bathing.         Hand Dominance        Extremity/Trunk Assessment               Lower Extremity Assessment: Generalized weakness;RLE deficits/detail RLE Deficits / Details: limited due to pain, unable to achieve terminal knee extension in supine due to R hip pain. Pt not able to follow commands for MMT at this time.       Communication   Communication: HOH  Cognition Arousal/Alertness: Lethargic Behavior During Therapy: WFL for tasks assessed/performed Overall Cognitive Status: Impaired/Different from baseline (Dementia at baseline) Area of Impairment: Orientation;Safety/judgement Orientation Level: Situation;Disoriented to;Time   Memory: Decreased recall of precautions   Safety/Judgement: Decreased awareness of safety          General Comments      Exercises        Assessment/Plan    PT Assessment Patient needs continued PT services  PT Diagnosis Difficulty walking;Abnormality of gait;Generalized weakness   PT Problem List Decreased strength;Decreased range of motion;Decreased activity tolerance;Decreased balance;Decreased mobility;Decreased cognition;Decreased knowledge of use of DME;Decreased safety awareness;Decreased knowledge of precautions;Cardiopulmonary status limiting activity  PT Treatment Interventions Gait training;DME instruction;Functional mobility training;Therapeutic activities;Therapeutic exercise;Balance training;Neuromuscular re-education;Cognitive remediation;Patient/family education;Wheelchair mobility training   PT Goals (Current goals can be found in the Care Plan section) Acute Rehab PT Goals PT Goal Formulation: Patient unable to participate in goal  setting Time For Goal Achievement: 06/06/16 Potential to Achieve Goals: Fair    Frequency Min 4X/week   Barriers to discharge        Co-evaluation               End of Session Equipment Utilized During Treatment: Gait belt Activity Tolerance: Patient limited by pain Patient left: in bed;with bed alarm set Nurse Communication: Mobility status (Pt is not able to stand, and will need a hoyer lift to get bed<>chair at this point. )    Functional Assessment Tool Used: [employer] AM-PAC '6-clicks"  Functional Limitation: Mobility: Walking and moving around Mobility: Walking and Moving Around Current Status [phone](G8978): At least 80 percent but less than 100 percent impaired, limited or restricted Mobility: Walking and Moving Around Goal Status [phone](G8979): At least 60 percent but less than  80 percent impaired, limited or restricted    Time: [id number] PT Time Calculation (min) (ACUTE ONLY): 41 min   Charges:   PT Evaluation $PT Eval Low Complexity: 1 Procedure PT Treatments $Therapeutic Activity: 23-37 mins   PT G Codes:   PT G-Codes **NOT FOR INPATIENT CLASS** Functional Assessment Tool Used: [employer] AM-PAC '6-clicks"  Functional Limitation: Mobility: Walking and moving around Mobility: Walking and Moving Around Current Status [phone](G8978): At least 80 percent but less than 100 percent impaired, limited or restricted Mobility: Walking and Moving Around Goal Status [phone](G8979): At least 60 percent but less than 80 percent impaired, limited or restricted    Beth Karysa Heft, PT, DPT X: [phone]

## 2016-05-24 DIAGNOSIS — D649 Anemia, unspecified: Secondary | ICD-10-CM

## 2016-05-24 DIAGNOSIS — R55 Syncope and collapse: Secondary | ICD-10-CM

## 2016-05-24 DIAGNOSIS — N200 Calculus of kidney: Secondary | ICD-10-CM

## 2016-05-24 DIAGNOSIS — Z7189 Other specified counseling: Secondary | ICD-10-CM

## 2016-05-24 LAB — PROTEIN ELECTROPHORESIS, SERUM
A/G RATIO SPE: 1.2 (ref 0.7–1.7)
ALPHA-1-GLOBULIN: 0.3 g/dL (ref 0.0–0.4)
ALPHA-2-GLOBULIN: 0.7 g/dL (ref 0.4–1.0)
Albumin ELP: 3 g/dL (ref 2.9–4.4)
Beta Globulin: 0.6 g/dL — ABNORMAL LOW (ref 0.7–1.3)
GLOBULIN, TOTAL: 2.5 g/dL (ref 2.2–3.9)
Gamma Globulin: 1 g/dL (ref 0.4–1.8)
TOTAL PROTEIN ELP: 5.5 g/dL — AB (ref 6.0–8.5)

## 2016-05-24 LAB — GLUCOSE, CAPILLARY
Glucose-Capillary: 128 mg/dL — ABNORMAL HIGH (ref 65–99)
Glucose-Capillary: 159 mg/dL — ABNORMAL HIGH (ref 65–99)

## 2016-05-24 LAB — CBC
HEMATOCRIT: 28.3 % — AB (ref 39.0–52.0)
Hemoglobin: 9.7 g/dL — ABNORMAL LOW (ref 13.0–17.0)
MCH: 32.1 pg (ref 26.0–34.0)
MCHC: 34.3 g/dL (ref 30.0–36.0)
MCV: 93.7 fL (ref 78.0–100.0)
Platelets: 71 10*3/uL — ABNORMAL LOW (ref 150–400)
RBC: 3.02 MIL/uL — ABNORMAL LOW (ref 4.22–5.81)
RDW: 14.1 % (ref 11.5–15.5)
WBC: 9.6 10*3/uL (ref 4.0–10.5)

## 2016-05-24 LAB — BASIC METABOLIC PANEL
Anion gap: 6 (ref 5–15)
BUN: 34 mg/dL — ABNORMAL HIGH (ref 6–20)
CALCIUM: 7.6 mg/dL — AB (ref 8.9–10.3)
CO2: 21 mmol/L — AB (ref 22–32)
CREATININE: 1.26 mg/dL — AB (ref 0.61–1.24)
Chloride: 108 mmol/L (ref 101–111)
GFR calc Af Amer: 54 mL/min — ABNORMAL LOW (ref >60)
GFR calc non Af Amer: 46 mL/min — ABNORMAL LOW (ref >60)
GLUCOSE: 131 mg/dL — AB (ref 65–99)
Potassium: 3.7 mmol/L (ref 3.5–5.1)
Sodium: 135 mmol/L (ref 135–145)

## 2016-05-24 LAB — CULTURE, BLOOD (ROUTINE X 2)

## 2016-05-24 LAB — HEPATITIS PANEL, ACUTE
HCV Ab: <0.1 s/co ratio (ref 0.0–0.9)
Hep A IgM: NEGATIVE
Hep B C IgM: NEGATIVE
Hepatitis B Surface Ag: NEGATIVE

## 2016-05-24 LAB — IGG, IGA, IGM
IGG (IMMUNOGLOBIN G), SERUM: 1019 mg/dL (ref 700–1600)
IgA: 240 mg/dL (ref 61–437)
IgM, Serum: 58 mg/dL (ref 15–143)

## 2016-05-24 LAB — IMMUNOFIXATION ELECTROPHORESIS
IGA: 244 mg/dL (ref 61–437)
IGG (IMMUNOGLOBIN G), SERUM: 1011 mg/dL (ref 700–1600)
IgM, Serum: 58 mg/dL (ref 15–143)
TOTAL PROTEIN ELP: 5.5 g/dL — AB (ref 6.0–8.5)

## 2016-05-24 LAB — ERYTHROPOIETIN: Erythropoietin: 82.9 m[IU]/mL — ABNORMAL HIGH (ref 2.6–18.5)

## 2016-05-24 LAB — HIV ANTIBODY (ROUTINE TESTING W REFLEX): HIV Screen 4th Generation wRfx: NONREACTIVE

## 2016-05-24 LAB — HEPARIN INDUCED PLATELET AB (HIT ANTIBODY): HEPARIN INDUCED PLT AB: 0.212 {OD_unit} (ref 0.000–0.400)

## 2016-05-24 LAB — HAPTOGLOBIN: HAPTOGLOBIN: 235 mg/dL — AB (ref 34–200)

## 2016-05-24 MED ORDER — CEFAZOLIN SODIUM-DEXTROSE 2-4 GM/100ML-% IV SOLN: 2.0000 g | Freq: Two times a day (BID) | INTRAVENOUS | Status: DC

## 2016-05-24 MED ORDER — HEPARIN SOD (PORK) LOCK FLUSH 100 UNIT/ML IV SOLN
250.0000 [IU] | INTRAVENOUS | Status: AC | PRN
  Administered 2016-05-24: 250 [IU]
  Filled 2016-05-24: qty 5

## 2016-05-24 MED ORDER — GABAPENTIN 100 MG PO CAPS: 100.0000 mg | ORAL_CAPSULE | Freq: Two times a day (BID) | ORAL | Status: DC | PRN

## 2016-05-24 MED ORDER — HYDROCODONE-ACETAMINOPHEN 5-325 MG PO TABS: 1.0000 | ORAL_TABLET | ORAL | Status: DC | PRN

## 2016-05-24 MED ORDER — HEPARIN SOD (PORK) LOCK FLUSH 100 UNIT/ML IV SOLN
250.0000 [IU] | INTRAVENOUS | Status: AC | PRN
  Administered 2016-05-24: 250 [IU]

## 2016-05-24 MED ORDER — INSULIN ASPART 100 UNIT/ML ~~LOC~~ SOLN: SUBCUTANEOUS | Status: DC

## 2016-05-24 MED ORDER — DICLOFENAC SODIUM 1 % TD GEL: 2.0000 g | Freq: Two times a day (BID) | TRANSDERMAL | Status: DC

## 2016-05-24 NOTE — Clinical Social Work Note (Signed)
Derenda FennelKara Stultz, LCSW, spoke with patient's daughter, Marlowe AltSylvia Brookshire, and advised that patient had a bed offer at [facility] and would be discharged today. Bed offer was accepted.  CSW and Ms. Brookshire discussed transport and it was decided that patient would transport via [facility].   CSW spoke with Eunice Blaseebbie at [facility] and advised that patient was discharging today.    CSW signing off.   Shelsey Rieth, Juleen ChinaHeather D, LCSW

## 2016-05-24 NOTE — Discharge Summary (Signed)
Sherrie Mustache, MD Physician Addendum Internal Medicine Discharge summary 05/24/2016 2:31 PM    Expand All Collapse All   Physician Discharge Summary  [name] XBJ:[id number] DOB: [birth year] DOA: 05/19/2016  PCP: Dwana Melena, MD  Admit date: 05/19/2016 Discharge date: 05/24/2016  Time spent: Greater than 30 minutes  Recommendations for Outpatient Follow-up:  1. Patient will need to follow-up with urologist, Marcine Matar, MD for outpatient lithotripsy. His office will arrange, but the skilled nursing facility should call #[phone] if there is no follow-up in one week.Patient was discharged with a Foley catheter in place. 2. Patient is being discharged with IV cefazolin which should continue through the last dose 06/17/16. Repeat blood cultures on 05/22/16 are pending, but negative to date. 3. Recommend follow-up of the patient's CBC in 5-7 days. Consider referral to hematology/oncology if his platelet count is less than 70,000. A number of labs were pending including protein electrophoresis and serotonin release assay 4. Patient is being discharged to a [facility] skilled nursing facility for rehabilitation.    Discharge Diagnoses:  1. Septic shock with lasted acidosis associated with MSSA bacteremia and Proteus UTI. 2. Urinary tract infection due to pyelonephritis secondary to Proteus. 3. History of right hydronephrosis, status post ureteral stent 03/17/16. 4. Acute hypoxic respiratory failure, secondary to sepsis. Resolved. 5. Elevated troponin I, secondary to demand ischemia in sepsis. 6. History of CAD/status post CABG/hypertension. 7. Chronic atrial fibrillation. 8. Stage III to stage IV chronic kidney disease. 9. Thrombocytopenia of unknown etiology. 10. Type 2 diabetes mellitus with nephropathy. 11. Right proximal leg cramping. 12. Normocytic anemia secondary to chronic kidney disease. 13. Syncope and collapse prior to hospitalization, secondary to #1.  Discharge Condition:  Stable  Diet recommendation: Heart healthy/carbohydrate modified.  Filed Weights   05/22/16 1404 05/23/16 0627 05/24/16 0647  Weight: 87.1 kg (192 lb 0.3 oz) 85.9 kg (189 lb 6 oz) 86.2 kg (190 lb 0.6 oz)    History of present illness:  Patient is a 82 year old man with a history of right hydronephrosis, status post stent April 2017, chronic atrial fibrillation, chronic diastolic congestive heart failure, and diabetes mellitus, who presented to the ED after being found down and unresponsive at the ALF. Reportedly, he had several episodes of emesis. In the ED, he was found to be febrile, tachycardic, tachypnea, hypotensive, and hypoxic. His lab data were significant for a creatinine of 1.61, troponin I of 0.07, lactic acid of 262, BNP of 709, WBC of 11.7, and platelet count of 111. His urinalysis revealed too numerous to count WBCs. He was admitted for further evaluation and management of septic shock and UTI.   Blood cultures were positive for MSSA and the urine culture was positive for Proteus. Remotely, ID Dr. Orvan Falconer started cefazolin to cover both. 2-D echocardiogram revealed no obvious vegetations. The family did not want invasive diagnostic evaluation and Dr. Orvan Falconer felt it was reasonable not to do the TEE, but would treat the bacteremia with 4 weeks of cefazolin.   Hospital Course:  1. Septic shock/lactic acidosis associated with MSSA bacteremia and Proteus UTI. On admission, the patient was febrile with a temperature of greater than 103. His white blood cell count was 11.7, but it increased to 27.2. Blood cultures and a urine culture ordered on admission. -Patient was started on broad-spectrum antibiotics with vancomycin, aztreonam, and Levaquin. Neo-Synephrine had to be started and was given for approx. 24 hours before it was discontinued. IV Solu-Cortef was given 24 hours. -His blood cultures grew out MSSA and urine  culture grew out Proteus. ID, Dr. Orvan Falconer remotely  narrowed antibiotic therapy to cefazolin which covered both bacteria. Discussed further with Dr. Orvan Falconer. The family did not desire invasive procedures like TEE, therefore, both Dr. Orvan Falconer and I thought that it would be appropriate to treat the patient empirically with 4 weeks of IV cefazolin. -Patient's fever resolved. His white blood cell count completely normalized. -He should be continued on treatment with cefazolin through the last dose on 06/17/16.  Urinary tract infection, likely polynephritis secondary to Proteus. History of right hydronephrosis status post ureteral stent 03/17/16. Indwelling Foley catheter was placed. His urinalysis was positive. Urine culture grew out Proteus which it did in 02/2006. Patient was started on antibiotics as above. Apparently the EDP discussed the patient with urology on-call on admission and there was no plan to remove the stent. Therapy narrowed to cefazolin per ID Dr. Orvan Falconer. Dr. Orvan Falconer recommended that if lithotripsy is planned, that it be done while the patient is on IV cefazolin. -Discussed patient with his urologist, Dr. Retta Diones who said that he would arrange for the patient to have lithotripsy. Patient was discharged with a Foley catheter in place until he is seen by Dr. Retta Diones in follow-up.   Acute respiratory failure with hypoxia. Patient's oxygen saturation was in the 80s intermittently in the ED. However, on 1.5 L of oxygen, his ABG revealed PO2 of 85. The transient hypoxia was likely from septic shock. His oxygen saturations have improved. He was oxygenating in the upper 90s on room air at the time of discharge.  Elevated troponin I in a patient with CAD/status post CABG. Patient troponin I was 0.07 on admission, but it trended up to 0.27.EKG revealed atrial fibrillation with right bundle branch block. The patient denied chest pain. Elevation was likely from demand ischemia as a consequence of sepsis. -He is treated chronically with  pindolol and aspirin. Aspirin was continued. Pindolol was held due to hypotension. Pindolol was restarted when his blood pressure improved. -His echo revealed an EF of 55-60% and no wall motion abnormality.  Chronic atrial fibrillation. Patient is treated with aspirin and pindolol. His rate was elevated on on admission due to sepsis. -Pindolol was was restarted following improving his blood pressure.  Chronic diastolic heart failure. Patient is treated with Lasix. Lasix was held secondary to treatment of septic shock. It was restarted at discharge when his blood pressure improved. There was no evidence of decompensated heart failure. -2-D echocardiogram revealed an EF of 55-60%; diastolic function was unable to be assessed secondary to atrial fibrillation.  Stage III to stage IV chronic kidney disease. Patient's baseline creatinine appears to be 1.8, but has ranged from 1.5-2.1 over the past 6 months. Creatinine was 1.61 on admission. It had trended up to 1.8. Urine output was nonoliguric. He was started on IV fluids as above. His renal function progressively improved and was 1.26 at the time of discharge.  Thrombocytopenia. Patient's platelet count was 111 on admission. He was started on subcutaneous heparin for DVT prophylaxis with caution. -Heparin was discontinued on 05/21/16 and Lovenox started. -Due to continued decrease in his platelet count, Lovenox was discontinued on 05/22/16 and start SCDs. -His vitamin B12 level and TSH were within normal limits. -Hematology was consulted. They noted that the patient had thrombocytopenia dating back to 2013 and normocytic anemia dating back to 2009. They ordered a number of studies. HIV was negative. Acute hepatitis panel was negative. IgG/IgA/IgM was within normal limits. Haptoglobin was marginally elevated. LDH was within normal  limits. HIT panel was negative. Serotonin release assay and protein electrophoresis were still pending. -Patient's  platelet count decreased to a nadir of 65, but started trending back up to 71. Recommend follow-up CBC in 1 week. If his platelet count has not improved, consider outpatient hematology follow-up.  Normocytic anemia, likely secondary to acute illness and chronic kidney disease. Studies for evaluation revealed a normal B12, normal ferritin, elevated erythropoietin. The decrease in his hemoglobin was in part hemodilutional. It trended down to 9.7 prior to discharge. Recommend follow-up of his CBC in 1 week or less.  Type 2 diabetes mellitus with nephropathy. Patient is treated with Januvia and metformin chronically. Both were initially withheld. Sliding scale NovoLog was started. His CBGs were controlled. His hemoglobin A1c was 6.6. Januvia was restarted, but metformin was not due to chronic kidney disease. Sliding scale NovoLog was added.  Right leg cramping Voltaren was started twice a day and added gabapentin twice a day when necessary. The leg cramping did limit his ability to ambulate, but when he was not having a leg cramp, he was more willing to have therapy.  Procedures: PICC line right upper extremity on 05/22/16  Echo 05/21/16: Study Conclusions - Left ventricle: The cavity size was normal. Wall thickness was  increased in a pattern of moderate LVH. Systolic function was  normal. The estimated ejection fraction was in the range of 55%  to 60%. Wall motion was normal; there were no regional wall  motion abnormalities. The study was not technically sufficient to  allow evaluation of LV diastolic dysfunction due to atrial  fibrillation. - Aortic valve: Valve area (VTI): 1.7 cm^2. Valve area (Vmax): 1.73  cm^2. - Mitral valve: Moderately calcified annulus. Mildly thickened  leaflets . There was mild regurgitation. - Left atrium: The atrium was moderately dilated. - Right ventricle: RV poorly visualized, grossly appears at least  moderately enlarged. Systolic function was  moderately to severely  reduced. TAPSE: 10 mm . - Right atrium: The atrium was severely dilated. - Pulmonary arteries: PASP is at lest moderately elevated at  . Cannot estimate RA pressure, IVC not visualized. Technically difficult study.   Consultations:  Hematology/oncology, Dr. Galen Manila (Mr. Jacalyn Lefevre PA)  Palliative care, Ms[facility]  Urology, Dr. Retta Diones  Discharge Exam: Filed Vitals:   05/23/16 2047 05/24/16 0647  BP: 122/64 127/74  Pulse: 98 88  Temp: 98 F (36.7 C) 98.5 F (36.9 C)  Resp: 18 18   General exam: 82 year old elderly Caucasian man in no acute distress. Respiratory system: A few bibasilar crackles. Respiratory effort normal. Cardiovascular system: Irregular, irregular with a soft systolic murmur and borderline tachycardia. No JVD, murmurs, rubs, gallops or clicks. No pedal edema. Gastrointestinal system: Abdomen is nondistended, soft and nontender. No organomegaly or masses felt. Normal bowel sounds heard. Central nervous system: Alert and oriented. No focal neurological deficits, but very hard of hearing. Extremities: Symmetric. No acute hot red joints. Skin: No rashes, lesions or ulcers Psychiatry: Judgement and insight appear normal. Pleasant affect. Speech is clear. He is very hard of hearing.  Discharge Instructions   Discharge Instructions    Diet - low sodium heart healthy  Complete by: As directed      Increase activity slowly  Complete by: As directed           Current Discharge Medication List    START taking these medications   Details  ceFAZolin (ANCEF) 2-4 GM/100ML-% IVPB Inject 100 mLs (2 g total) into the vein every 12 (twelve) hours.  Give the last dose on 06/17/16.    diclofenac sodium (VOLTAREN) 1 % GEL Apply 2 g topically 2 (two) times daily. Applied to proximal right leg front and back (quadriceps and hamstrings).    !! gabapentin (NEURONTIN) 100 MG capsule Take 1 capsule  (100 mg total) by mouth 2 (two) times daily as needed (Muscle cramping).    insulin aspart (NOVOLOG) 100 UNIT/ML injection Sliding scale NovoLog 3 times daily: CBG 70-120 no units. CBG 121-150 give 2 units. CBG 151-200 give 3 units. CBG 201-250 give 5 units. CBG 251-300 give 8 units. CBG 301-350 give 11 units. CBG 351-400 give 15 units.    !! - Potential duplicate medications found. Please discuss with provider.    CONTINUE these medications which have CHANGED   Details  HYDROcodone-acetaminophen (NORCO/VICODIN) 5-325 MG tablet Take 1 tablet by mouth every 4 (four) hours as needed for moderate pain. Qty: 30 tablet, Refills: 0      CONTINUE these medications which have NOT CHANGED   Details  aspirin EC 81 MG tablet Take 81 mg by mouth every evening.     beta carotene w/minerals (OCUVITE) tablet Take 1 tablet by mouth every evening.     dutasteride (AVODART) 0.5 MG capsule Take 0.5 mg by mouth every evening.     Emollient (EUCERIN) lotion Apply 1 Bottle topically 2 (two) times daily as needed for dry skin.    furosemide (LASIX) 20 MG tablet Take 1 tablet (20 mg total) by mouth daily. Qty: 10 tablet, Refills: 0    !! gabapentin (NEURONTIN) 100 MG capsule Take 100 mg by mouth at bedtime.    mirabegron ER (MYRBETRIQ) 50 MG TB24 tablet Take 50 mg by mouth daily.    Multiple Vitamin (DAILY VITE PO) Take 1 tablet by mouth daily.    omeprazole (PRILOSEC) 40 MG capsule Take 40 mg by mouth daily.    pindolol (VISKEN) 5 MG tablet Take 2.5 mg by mouth daily.     sitaGLIPtin (JANUVIA) 100 MG tablet Take 100 mg by mouth every evening.     !! - Potential duplicate medications found. Please discuss with provider.    STOP taking these medications     metFORMIN (GLUCOPHAGE) 500 MG tablet        Allergies  Allergen Reactions  . Acetaminophen-Codeine     Unknown reaction-not listed on current MAR  . Advil [Ibuprofen]  Other (See Comments)    Unknown reaction  . Penicillins     Unknown reaction-not listed on current MAR   Follow-up Information    Follow up with DAHLSTEDT, Bertram Millard, MD.   Specialty: Urology   Why: Please call our office within a week after discharge to set up a follow-up in the office with eventual stone treatment.   Contact information:   [address] [place] [address] [phone]         The results of significant diagnostics from this hospitalization (including imaging, microbiology, ancillary and laboratory) are listed below for reference.    Significant Diagnostic Studies:  Imaging Results    Dg Chest 1 View  05/22/2016 CLINICAL DATA: PICC adjustment EXAM: CHEST 1 VIEW COMPARISON: Chest radiograph from earlier today. FINDINGS: Right PICC terminates in the lower third of the superior vena cava. Sternotomy wires appear aligned and intact. CABG clips overlie the mediastinum. Stable cardiomediastinal silhouette with mild cardiomegaly. No pneumothorax. No pleural effusion. No overt pulmonary edema. Mild bibasilar scarring versus atelectasis. IMPRESSION: 1. Right PICC terminates in the lower  third of the superior vena cava. 2. Stable mild cardiomegaly without overt pulmonary edema. 3. Mild bibasilar scarring versus atelectasis. Electronically Signed By: Delbert Phenix M.D. On: 05/22/2016 13:50   Dg Chest 1 View  05/22/2016 CLINICAL DATA: PICC line placement EXAM: CHEST 1 VIEW COMPARISON: 05/20/2016 FINDINGS: Cardiomegaly again noted. There is right arm PICC line with tip in right atrium. No pneumothorax. Mild perihilar interstitial prominence suspicious for mild interstitial edema. No segmental infiltrate. Status post CABG free IMPRESSION: Right arm PICC line with tip in right atrium. No pneumothorax. Mild perihilar interstitial prominence suspicious for mild interstitial edema. Electronically Signed By: Natasha Mead M.D. On: 05/22/2016  12:06   Ct Head Wo Contrast  05/20/2016 CLINICAL DATA: Acute onset of altered mental status. Status post syncope. Patient unresponsive. Fever and vomiting. Initial encounter. EXAM: CT HEAD WITHOUT CONTRAST CT CERVICAL SPINE WITHOUT CONTRAST TECHNIQUE: Multidetector CT imaging of the head and cervical spine was performed following the standard protocol without intravenous contrast. Multiplanar CT image reconstructions of the cervical spine were also generated. COMPARISON: CT of the head performed 09/28/2014 FINDINGS: CT HEAD FINDINGS There is no evidence of acute infarction, mass lesion, or intra- or extra-axial hemorrhage on CT. Prominence of ventricles and sulci reflects moderate cortical volume loss. Mild cerebellar atrophy is noted. Scattered periventricular and subcortical white matter change likely reflects small vessel ischemic microangiopathy. A chronic infarct is noted at the medial left occipital lobe, with associated encephalomalacia. The brainstem and fourth ventricle are within normal limits. The basal ganglia are unremarkable in appearance. No mass effect or midline shift is seen. There is no evidence of fracture; visualized osseous structures are unremarkable in appearance. The orbits are within normal limits. There is mild partial opacification of the right mastoid air cells. The paranasal sinuses and left mastoid air cells are well-aerated. No significant soft tissue abnormalities are seen. CT CERVICAL SPINE FINDINGS There is no evidence of acute fracture or subluxation. Vertebral bodies demonstrate normal height. There is minimal grade 1 anterolisthesis of C3 on C4, and minimal grade 1 anterolisthesis of C6 on C7. Multilevel disc space narrowing is noted along the mid cervical spine, with anterior and posterior disc osteophyte complexes. Underlying facet disease is noted along the cervical spine. Prevertebral soft tissues are within normal limits. A 4.2 cm hypodensity is suggested at the left  thyroid lobe. Mild interstitial prominence is noted at the lung apices. Scattered calcification is seen at the carotid bifurcations bilaterally. IMPRESSION: 1. No evidence of traumatic intracranial injury or fracture. 2. No evidence of acute fracture or subluxation along the cervical spine. 3. Moderate cortical volume loss and scattered small vessel ischemic microangiopathy. 4. 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 5. Degenerative change along the cervical spine. 6. 4.2 cm hypodensity suggested at the left thyroid lobe. Consider further evaluation with thyroid ultrasound, as deemed clinically appropriate. If patient is clinically hyperthyroid, consider nuclear medicine thyroid uptake and scan. 7. Mild interstitial prominence at the lung apices. 8. Scattered calcification at the carotid bifurcations bilaterally. Carotid ultrasound would be helpful for further evaluation, when and as deemed clinically appropriate. 9. Mild partial opacification of the right mastoid air cells. Electronically Signed By: Roanna Raider M.D. On: 05/20/2016 02:18   Ct Cervical Spine Wo Contrast  05/20/2016 CLINICAL DATA: Acute onset of altered mental status. Status post syncope. Patient unresponsive. Fever and vomiting. Initial encounter. EXAM: CT HEAD WITHOUT CONTRAST CT CERVICAL SPINE WITHOUT CONTRAST TECHNIQUE: Multidetector CT imaging of the head and cervical spine  was performed following the standard protocol without intravenous contrast. Multiplanar CT image reconstructions of the cervical spine were also generated. COMPARISON: CT of the head performed 09/28/2014 FINDINGS: CT HEAD FINDINGS There is no evidence of acute infarction, mass lesion, or intra- or extra-axial hemorrhage on CT. Prominence of ventricles and sulci reflects moderate cortical volume loss. Mild cerebellar atrophy is noted. Scattered periventricular and subcortical white matter change likely reflects small  vessel ischemic microangiopathy. A chronic infarct is noted at the medial left occipital lobe, with associated encephalomalacia. The brainstem and fourth ventricle are within normal limits. The basal ganglia are unremarkable in appearance. No mass effect or midline shift is seen. There is no evidence of fracture; visualized osseous structures are unremarkable in appearance. The orbits are within normal limits. There is mild partial opacification of the right mastoid air cells. The paranasal sinuses and left mastoid air cells are well-aerated. No significant soft tissue abnormalities are seen. CT CERVICAL SPINE FINDINGS There is no evidence of acute fracture or subluxation. Vertebral bodies demonstrate normal height. There is minimal grade 1 anterolisthesis of C3 on C4, and minimal grade 1 anterolisthesis of C6 on C7. Multilevel disc space narrowing is noted along the mid cervical spine, with anterior and posterior disc osteophyte complexes. Underlying facet disease is noted along the cervical spine. Prevertebral soft tissues are within normal limits. A 4.2 cm hypodensity is suggested at the left thyroid lobe. Mild interstitial prominence is noted at the lung apices. Scattered calcification is seen at the carotid bifurcations bilaterally. IMPRESSION: 1. No evidence of traumatic intracranial injury or fracture. 2. No evidence of acute fracture or subluxation along the cervical spine. 3. Moderate cortical volume loss and scattered small vessel ischemic microangiopathy. 4. 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 5. Degenerative change along the cervical spine. 6. 4.2 cm hypodensity suggested at the left thyroid lobe. Consider further evaluation with thyroid ultrasound, as deemed clinically appropriate. If patient is clinically hyperthyroid, consider nuclear medicine thyroid uptake and scan. 7. Mild interstitial prominence at the lung apices. 8. Scattered calcification at the  carotid bifurcations bilaterally. Carotid ultrasound would be helpful for further evaluation, when and as deemed clinically appropriate. 9. Mild partial opacification of the right mastoid air cells. Electronically Signed By: Roanna RaiderJeffery Chang M.D. On: 05/20/2016 02:18   Ct Abdomen Pelvis W Contrast  05/20/2016 CLINICAL DATA: 82 year old male with altered mental status syncope. Patient complaining of abdominal pain. EXAM: CT ABDOMEN AND PELVIS WITH CONTRAST TECHNIQUE: Multidetector CT imaging of the abdomen and pelvis was performed using the standard protocol following bolus administration of intravenous contrast. CONTRAST: 100mL ISOVUE-300 IOPAMIDOL (ISOVUE-300) INJECTION 61% COMPARISON: Abdominal CT dated 03/22/2016 FINDINGS: Evaluation is limited due to streak artifact caused by patient's arms. Bibasilar linear atelectasis/ scarring. The visualized lung bases are otherwise clear. Mild cardiomegaly. Calcified mitral annulus. Coronary vascular calcification and CABG clips. No intra-abdominal free air or free fluid. Cholecystectomy. The liver, pancreas, spleen, adrenal glands appear unremarkable. There is a right ureteral pigtail stent with proximal tip in the right renal pelvis and distal end within the urinary bladder. There is mild fullness of the right renal pelvis with parent urothelial enhancement. Correlation with urinalysis recommended to exclude UTI. There is excreted contrast versus a 4 mm nonobstructing right renal upper pole calculus. A 3 mm nonobstructing left renal interpolar calculus noted. A 1.8 cm left renal interpolar hypodense lesion is not well characterized but likely represents a cyst. There is no hydronephrosis on the left. There is slight  heterogeneous enhancement of the left kidney. Correlation with urinalysis recommended. The left ureter appears unremarkable. The urinary bladder is decompressed around a Foley catheter. There is sigmoid diverticulosis without active inflammatory  changes. There is no evidence of bowel obstruction. Fluid within the distal esophagus likely related to gastroesophageal reflux. There is apparent diffuse thickening of the wall of the stomach likely related to underdistention. Gastritis is less likely but not excluded. Clinical correlation is recommended. Normal appendix. There is advanced aortoiliac atherosclerotic disease. The IVC appears unremarkable. No portal venous gas identified. There is no adenopathy. There is a small fat containing left inguinal hernia. Small fat containing umbilical hernia is also noted. The abdominal wall soft tissues are otherwise unremarkable. There is osteopenia with multilevel degenerative changes of the spine. Lower lumbar laminectomy and L4-L5 disc spacer and posterior fusion hardware noted. No acute fracture. IMPRESSION: Colonic diverticulosis. No evidence of bowel obstruction. Normal appendix. Under distention of the stomach versus less likely gastritis. Clinical correlation is recommended. Interval placement of a right sided ureteral stent. There is mild fullness of the right renal pelvis with urothelial enhancement. Correlation with urinalysis recommended to exclude UTI. Excreted intravenous contrast versus a nonobstructing 4 mm right renal upper pole calculus. Electronically Signed By: Elgie Collard M.D. On: 05/20/2016 02:21   Dg Chest Port 1 View  05/20/2016 CLINICAL DATA: 82 year old male with shortness of breath EXAM: PORTABLE CHEST 1 VIEW COMPARISON: Chest radiograph dated 03/22/2016 FINDINGS: Single portable view of the chest demonstrates mild cardiomegaly with increased central vascular prominence compatible with mild congestive changes. There is no focal consolidation, pleural effusion, or pneumothorax. Median sternotomy wires and CABG vascular clips. No acute osseous pathology. IMPRESSION: Mild cardiomegaly with mild congestion. No focal consolidation. Electronically Signed By: Elgie Collard M.D.  On: 05/20/2016 00:37   Dg Chest Port 1v Same Day  05/20/2016 CLINICAL DATA: 82 year old male with central line placement. EXAM: PORTABLE CHEST 1 VIEW COMPARISON: Chest radiograph dated 05/20/2016 FINDINGS: There has been interval placement of a right IJ central venous catheter with tip over central SVC. No pneumothorax. There is cardiomegaly with mild congestive changes and mild bronchiectasis. No focal consolidation, pleural effusion, or pneumothorax. The mass now male wires and CABG vascular clips. No acute osseous pathology. IMPRESSION: Right IJ central line with tip over central SVC. No pneumothorax. Electronically Signed By: Elgie Collard M.D. On: 05/20/2016 04:39   Dg Hip Port Unilat With Pelvis 1v Right  05/23/2016 CLINICAL DATA: Hypersensitive to touch right lower extremity. Denies recent injury or fall. Right hip pain with difficulty straightening length. EXAM: DG HIP (WITH OR WITHOUT PELVIS) 1V PORT RIGHT COMPARISON: CT 05/20/2016 FINDINGS: There mild symmetric degenerative changes of the hips. There is no acute fracture or dislocation. Fusion hardware intact over the lumbosacral spine. Partially visualized right internal ureteral stent which appears in adequate position. Small caliber rectal catheter present. IMPRESSION: No acute findings. Mild symmetric degenerative change of the hips. Electronically Signed By: Elberta Fortis M.D. On: 05/23/2016 13:51     Microbiology: Recent Results (from the past 240 hour(s))  Urine culture Status: Abnormal   Collection Time: 05/19/16 11:59 PM  Result Value Ref Range Status   Specimen Description URINE, CLEAN CATCH  Final   Special Requests NONE  Final   Culture 80,000 COLONIES/mL PROTEUS MIRABILIS (A)  Final   Report Status 05/22/2016 FINAL  Final   Organism ID, Bacteria PROTEUS MIRABILIS (A)  Final   Susceptibility   Proteus mirabilis - MIC*    AMPICILLIN >=32 RESISTANT Resistant  CEFAZOLIN <=4 SENSITIVE Sensitive     CEFTRIAXONE <=1 SENSITIVE Sensitive     CIPROFLOXACIN <=0.25 SENSITIVE Sensitive     GENTAMICIN <=1 SENSITIVE Sensitive     IMIPENEM 1 SENSITIVE Sensitive     NITROFURANTOIN 128 RESISTANT Resistant     TRIMETH/SULFA <=20 SENSITIVE Sensitive     AMPICILLIN/SULBACTAM <=2 SENSITIVE Sensitive     PIP/TAZO <=4 SENSITIVE Sensitive    * 80,000 COLONIES/mL PROTEUS MIRABILIS  Blood Culture (routine x 2) Status: Abnormal   Collection Time: 05/20/16 12:08 AM  Result Value Ref Range Status   Specimen Description BLOOD RIGHT HAND  Final   Special Requests BOTTLES DRAWN AEROBIC AND ANAEROBIC 6CC EACH  Final   Culture Setup Time   Final    GRAM POSITIVE COCCI IN CLUSTERS AEROBIC BOTTLE ONLY Gram Stain Report Called to,Read Back By and Verified With: SPANGLER E. AT 1145A ON [id number] BY THOMPSON S. CRITICAL RESULT CALLED TO, READ BACK BY AND VERIFIED WITH: E SPANGLER,PHARMD AT 1740 05/20/16 BY Elson Clan Performed at [facility]    Culture STAPHYLOCOCCUS AUREUS (A)  Final   Report Status 05/22/2016 FINAL  Final   Organism ID, Bacteria STAPHYLOCOCCUS AUREUS  Final   Susceptibility   Staphylococcus aureus - MIC*    CIPROFLOXACIN >=8 RESISTANT Resistant     ERYTHROMYCIN >=8 RESISTANT Resistant     GENTAMICIN <=0.5 SENSITIVE Sensitive     OXACILLIN 0.5 SENSITIVE Sensitive     TETRACYCLINE <=1 SENSITIVE Sensitive     VANCOMYCIN 1 SENSITIVE Sensitive     TRIMETH/SULFA <=10 SENSITIVE Sensitive     CLINDAMYCIN <=0.25 SENSITIVE Sensitive     RIFAMPIN <=0.5 SENSITIVE Sensitive     Inducible Clindamycin NEGATIVE Sensitive    * STAPHYLOCOCCUS AUREUS  Blood Culture ID Panel (Reflexed) Status: Abnormal   Collection Time: 05/20/16 12:08 AM  Result Value Ref Range Status   Enterococcus  species NOT DETECTED NOT DETECTED Final   Vancomycin resistance NOT DETECTED NOT DETECTED Final   Listeria monocytogenes NOT DETECTED NOT DETECTED Final   Staphylococcus species DETECTED (A) NOT DETECTED Final    Comment: CRITICAL RESULT CALLED TO, READ BACK BY AND VERIFIED WITH: Desma Maxim RN 17:40 05/20/16 ([email or web address])    Staphylococcus aureus DETECTED (A) NOT DETECTED Final    Comment: Desma Maxim RN 17:40 05/20/16 ([email or web address])   Methicillin resistance NOT DETECTED NOT DETECTED Final   Streptococcus species NOT DETECTED NOT DETECTED Final   Streptococcus agalactiae NOT DETECTED NOT DETECTED Final   Streptococcus pneumoniae NOT DETECTED NOT DETECTED Final   Streptococcus pyogenes NOT DETECTED NOT DETECTED Final   Acinetobacter baumannii NOT DETECTED NOT DETECTED Final   Enterobacteriaceae species NOT DETECTED NOT DETECTED Final   Enterobacter cloacae complex NOT DETECTED NOT DETECTED Final   Escherichia coli NOT DETECTED NOT DETECTED Final   Klebsiella oxytoca NOT DETECTED NOT DETECTED Final   Klebsiella pneumoniae NOT DETECTED NOT DETECTED Final   Proteus species NOT DETECTED NOT DETECTED Final   Serratia marcescens NOT DETECTED NOT DETECTED Final   Carbapenem resistance NOT DETECTED NOT DETECTED Final   Haemophilus influenzae NOT DETECTED NOT DETECTED Final   Neisseria meningitidis NOT DETECTED NOT DETECTED Final   Pseudomonas aeruginosa NOT DETECTED NOT DETECTED Final   Candida albicans NOT DETECTED NOT DETECTED Final   Candida glabrata NOT DETECTED NOT DETECTED Final   Candida krusei NOT DETECTED NOT DETECTED Final   Candida parapsilosis NOT DETECTED NOT DETECTED Final   Candida tropicalis NOT DETECTED NOT DETECTED Final  Blood Culture (  routine x 2) Status: Abnormal   Collection Time: 05/20/16 12:20 AM  Result Value Ref Range Status    Specimen Description BLOOD RIGHT ANTECUBITAL  Final   Special Requests BOTTLES DRAWN AEROBIC ONLY 2CC  Final   Culture Setup Time   Final    GRAM POSITIVE COCCI Gram Stain Report Called to,Read Back By and Verified With: CHAPEL,R. AT 1305 ON 05/20/2016 BY AGUNDIZ,E. AEROBIC BOTTLE ONLY Performed at [facility]    Culture (A)  Final    STAPHYLOCOCCUS AUREUS SUSCEPTIBILITIES PERFORMED ON PREVIOUS CULTURE WITHIN THE LAST 5 DAYS. Performed [facility]    Report Status 05/22/2016 FINAL  Final  MRSA PCR Screening Status: None   Collection Time: 05/20/16 4:47 AM  Result Value Ref Range Status   MRSA by PCR NEGATIVE NEGATIVE Final    Comment:   The GeneXpert MRSA Assay (FDA approved for NASAL specimens only), is one component of a comprehensive MRSA colonization surveillance program. It is not intended to diagnose MRSA infection nor to guide or monitor treatment for MRSA infections.   Culture, blood (routine x 2) Status: Abnormal   Collection Time: 05/21/16 10:08 AM  Result Value Ref Range Status   Specimen Description BLOOD RIGHT HAND  Final   Special Requests BOTTLES DRAWN AEROBIC AND ANAEROBIC 6CC  Final   Culture Setup Time   Final    GRAM POSITIVE COCCI Gram Stain Report Called to,Read Back By and Verified With: SMITH,K AT 1159 ON 05/21/2016 BY AGUNDIZ,E. IN BOTH AEROBIC AND ANAEROBIC BOTTLES    Culture (A)  Final    STAPHYLOCOCCUS AUREUS SUSCEPTIBILITIES PERFORMED ON PREVIOUS CULTURE WITHIN THE LAST 5 DAYS. Performed at [facility]    Report Status 05/24/2016 FINAL  Final  Culture, blood (routine x 2) Status: Abnormal   Collection Time: 05/21/16 10:17 AM  Result Value Ref Range Status   Specimen Description BLOOD LEFT HAND  Final   Special Requests BOTTLES DRAWN AEROBIC AND ANAEROBIC 6CC  Final   Culture Setup Time    Final    GRAM POSITIVE COCCI Gram Stain Report Called to,Read Back By and Verified With: SMITH,K. AT 1159 ON 05/21/2016 BY AGUNDIZ,E. IN BOTH AEROBIC AND ANAEROBIC BOTTLES    Culture (A)  Final    STAPHYLOCOCCUS AUREUS SUSCEPTIBILITIES PERFORMED ON PREVIOUS CULTURE WITHIN THE LAST 5 DAYS. Performed at [facility]    Report Status 05/24/2016 FINAL  Final  Culture, blood (routine x 2) Status: None (Preliminary result)   Collection Time: 05/22/16 12:26 PM  Result Value Ref Range Status   Specimen Description BLOOD PICC LINE  Final   Special Requests BOTTLES DRAWN AEROBIC AND ANAEROBIC 6CC EACH  Final   Culture NO GROWTH < 24 HOURS  Final   Report Status PENDING  Incomplete  Culture, blood (routine x 2) Status: None (Preliminary result)   Collection Time: 05/22/16 12:30 PM  Result Value Ref Range Status   Specimen Description BLOOD RIGHT HAND  Final   Special Requests BOTTLES DRAWN AEROBIC AND ANAEROBIC [facility]7CC EACH  Final   Culture NO GROWTH < 24 HOURS  Final   Report Status PENDING  Incomplete     Labs: Basic Metabolic Panel:  Last Labs      Recent Labs Lab 05/20/16 0734 05/21/16 0440 05/22/16 0421 05/23/16 0626 05/24/16 0530  NA 134* 134* 132* 135 135  K 3.6 3.7 3.9 3.6 3.7  CL 102 104 105 107 108  CO2 24 22 21* 22 21*  GLUCOSE 188*  123* 133* 132* 131*  BUN 32* 35* 43* 42* 34*  CREATININE 1.64* 1.64* 1.81* 1.49* 1.26*  CALCIUM 8.0* 8.0* 7.9* 7.9* 7.6*     Liver Function Tests:  Last Labs      Recent Labs Lab 05/19/16 2348 05/20/16 0734 05/21/16 0440  AST 26 28 25   ALT 15* 14* 13*  ALKPHOS 72 56 49  BILITOT 1.0 1.2 0.8  PROT 8.0 6.4* 6.0*  ALBUMIN 4.3 3.3* 3.0*      Last Labs     No results for input(s): LIPASE, AMYLASE in the last 168 hours.    Last Labs     No results for input(s):  AMMONIA in the last 168 hours.   CBC:  Last Labs      Recent Labs Lab 05/19/16 2348 05/20/16 0734 05/21/16 0755 05/22/16 0421 05/23/16 0626 05/23/16 1112 05/24/16 0530  WBC 11.7* 27.2* 15.8* 15.2* 10.7* --  9.6  NEUTROABS 10.8* --  --  --  --  8.1* --   HGB 12.5* 10.8* 10.8* 10.4* 9.4* --  9.7*  HCT 37.3* 32.4* 31.8* 30.9* 27.9* --  28.3*  MCV 96.6 98.5 96.7 96.0 95.5 --  93.7  PLT 111* 100* 76* 72* 65* --  71*     Cardiac Enzymes:  Last Labs      Recent Labs Lab 05/19/16 2348 05/21/16 0440 05/22/16 0421  TROPONINI 0.07* 0.27* 0.24*     BNP: BNP (last 3 results)  Recent Labs (within last 365 days)     Recent Labs  03/22/16 0540 05/20/16 0022 05/22/16 0421  BNP 588.0* 709.0* 491.0*      ProBNP (last 3 results)  Recent Labs (within last 365 days)    No results for input(s): PROBNP in the last 8760 hours.    CBG:  Last Labs      Recent Labs Lab 05/23/16 1143 05/23/16 1637 05/23/16 2024 05/24/16 0751 05/24/16 1140  GLUCAP 168* 116* 136* 128* 159*         Signed:  Rosaly Labarbera MD.  [facility] 05/24/2016, 2:32 PM

## 2016-05-24 NOTE — Consult Note (Signed)
Urology Consult  Consulting MD: Elliot Cousinenise Fisher, M.D.  CC: Kidney stones, urinary infection  HPI: This is a 82 year old male recently admitted to the hospital with a repeat urinary tract infection. He underwent urgent stenting of an infected, obstructing 7 mm right proximal ureteral stone on 03/22/2016. Urine culture grew Proteus. Postoperative course was unremarkable. He recently re-presented with another infection. The patient has not followed up with us between that date and his recent hospitalization. He was admitted with a repeat urinary tract infection. He has been put back on antibiotics, and recently has grown repeat Proteus organism. He is not currently complaining of flank pain, gross hematuria or dysuria.  PMH: Past Medical History  Diagnosis Date  . Coronary atherosclerosis of native coronary artery     Multivessel status post CABG  . Mixed hyperlipidemia   . Type 2 diabetes mellitus (HCC)   . Atrial fibrillation (HCC)   . Essential hypertension, benign   . Sleep apnea   . Hydroureteronephrosis 03/22/2016  . CKD (chronic kidney disease)     PSH: Past Surgical History  Procedure Laterality Date  . Tonsillectomy    . Cholecystectomy    . Knee arthroscopy    . Hernia repair    . Coronary artery bypass graft  1992    LIMA to LAD and diagonal, SVG to RCA, SVG to OM  . Back surgery    . Esophagogastroduodenoscopy (egd) with esophageal dilation N/A 09/12/2013    Procedure: ESOPHAGOGASTRODUODENOSCOPY (EGD) WITH ESOPHAGEAL DILATION;  Surgeon: Malissa HippoNajeeb U Rehman, MD;  Location: [facility];  Service: Endoscopy;  Laterality: N/A;  . Cystoscopy w/ ureteral stent placement Right 03/22/2016    Procedure: CYSTOSCOPY WITH RIGHT RETROGRADE PYELOGRAM, RIGHT URETERAL STENT PLACEMENT;  Surgeon: Marcine MatarStephen Kassity Woodson, MD;  Location: [facility];  Service: Urology;  Laterality: Right;    Allergies: Allergies  Allergen Reactions  . Acetaminophen-Codeine     Unknown reaction-not listed on current MAR   . Advil [Ibuprofen] Other (See Comments)    Unknown reaction  . Penicillins     Unknown reaction-not listed on current MAR    Medications: Prescriptions prior to admission  Medication Sig Dispense Refill Last Dose  . aspirin EC 81 MG tablet Take 81 mg by mouth every evening.    05/18/2016  . beta carotene w/minerals (OCUVITE) tablet Take 1 tablet by mouth every evening.    05/18/2016 at Unknown time  . dutasteride (AVODART) 0.5 MG capsule Take 0.5 mg by mouth every evening.    05/18/2016 at Unknown time  . Emollient (EUCERIN) lotion Apply 1 Bottle topically 2 (two) times daily as needed for dry skin.   unknown  . furosemide (LASIX) 20 MG tablet Take 1 tablet (20 mg total) by mouth daily. 10 tablet 0 05/19/2016 at Unknown time  . gabapentin (NEURONTIN) 100 MG capsule Take 100 mg by mouth at bedtime.   05/18/2016 at Unknown time  . HYDROcodone-acetaminophen (NORCO/VICODIN) 5-325 MG tablet Take 1-2 tablets by mouth every 4 (four) hours as needed for moderate pain. 30 tablet 0 unknown  . metFORMIN (GLUCOPHAGE) 500 MG tablet Take 1 tablet by mouth daily.   05/18/2016 at Unknown time  . mirabegron ER (MYRBETRIQ) 50 MG TB24 tablet Take 50 mg by mouth daily.   05/18/2016 at Unknown time  . Multiple Vitamin (DAILY VITE PO) Take 1 tablet by mouth daily.   05/19/2016 at Unknown time  . omeprazole (PRILOSEC) 40 MG capsule Take 40 mg by mouth daily.   05/19/2016 at Unknown  time  . pindolol (VISKEN) 5 MG tablet Take 2.5 mg by mouth daily.    05/19/2016 at Unknown time  . sitaGLIPtin (JANUVIA) 100 MG tablet Take 100 mg by mouth every evening.    05/18/2016 at Unknown time     Social History: Social History   Social History  . Marital Status: Widowed    Spouse Name: N/A  . Number of Children: N/A  . Years of Education: college   Occupational History  . Retired    Social History Main Topics  . Smoking status: Former Smoker    Start date: 11/28/1944    Quit date: 11/29/1963  . Smokeless tobacco: Never  Used  . Alcohol Use: No  . Drug Use: No  . Sexual Activity: Not Currently   Other Topics Concern  . Not on file   Social History Narrative    Family History: Family History  Problem Relation Age of Onset  . Diabetes      Review of Systems: Positive: See H&P p Negative:   A further 10 point review of systems was negative except what is listed in the HPI.  Physical Exam: @VITALS2 @ General: No acute distress.  Awake. Somewhat confused Head:  Normocephalic.  Atraumatic. ENT:  EOMI.  Mucous membranes moist Neck:  Supple.   CV:   Regular rate. Pulmonary: Equal effort bilaterally.  C. Abdomen: Soft.  Protuberant, no mass, no megaly, no tenderness. Skin:  Normal turgor.  No visible rash. Extremity: No gross deformity of bilateral upper extremities.  No gross deformity of    bilateral lower extremities. Neurologic: Alert. Appropriate mood.    Studies:  Recent Labs     05/23/16  0626  05/24/16  0530  HGB  9.4*  9.7*  WBC  10.7*  9.6  PLT  65*  71*    Recent Labs     05/23/16  0626  05/24/16  0530  NA  135  135  K  3.6  3.7  CL  107  108  CO2  22  21*  BUN  42*  34*  CREATININE  1.49*  1.26*  CALCIUM  7.9*  7.6*  GFRNONAA  38*  46*  GFRAA  44*  54*     No results for input(s): INR, APTT in the last 72 hours.  Invalid input(s): PT   Invalid input(s): ABG    Assessment:  7 mm right proximal ureteral stone, stented 2 months ago, patient has not had treatment of the stone yet. He has recurrent Proteus UTI. This has been properly treated  Plan: We will arrange outpatient follow-up with the patient. I do agree with keeping the patient on an antibiotic until his lithotripsy. Lithotripsy will most likely be the best method of managing this patient's stone. Okay to discharge at your leisure.    Pager:[phone]

## 2016-05-24 NOTE — Progress Notes (Signed)
Pharmacy Antibiotic Note  [name] is a 82 y.o. male admitted on 05/19/2016 with sepsis.  Patient initially started on vancomycin, aztreonam, and levaquin.  BC positive for MSSA.  ABX changed to Ancef, appreciate ID input and recommendations.  Plan: Ancef 2gm IV q12 hours F/u renal function, cultures and clinical course  Height:  (170.2 cm) Weight: 190 lb 0.6 oz (86.2 kg) IBW/kg (Calculated) : 66.1  Temp (24hrs), Avg:98 F (36.7 C), Min:97.6 F (36.4 C), Max:98.5 F (36.9 C)   Recent Labs Lab 05/20/16 0007 05/20/16 0302 05/20/16 0734 05/21/16 0440 05/21/16 0755 05/22/16 0421 05/23/16 0626 05/24/16 0530  WBC  --   --  27.2*  --  15.8* 15.2* 10.7* 9.6  CREATININE  --   --  1.64* 1.64*  --  1.81* 1.49* 1.26*  LATICACIDVEN 2.62* 3.42*  --   --  2.2*  --   --   --     Estimated Creatinine Clearance: 35.9 mL/min (by C-G formula based on Cr of 1.26).    Allergies  Allergen Reactions  . Acetaminophen-Codeine     Unknown reaction-not listed on current MAR  . Advil [Ibuprofen] Other (See Comments)    Unknown reaction  . Penicillins     Unknown reaction-not listed on current MAR   Recent Results (from the past 240 hour(s))  Urine culture     Status: Abnormal   Collection Time: 05/19/16 11:59 PM  Result Value Ref Range Status   Specimen Description URINE, CLEAN CATCH  Final   Special Requests NONE  Final   Culture 80,000 COLONIES/mL PROTEUS MIRABILIS (A)  Final   Report Status 05/22/2016 FINAL  Final   Organism ID, Bacteria PROTEUS MIRABILIS (A)  Final      Susceptibility   Proteus mirabilis - MIC*    AMPICILLIN >=32 RESISTANT Resistant     CEFAZOLIN <=4 SENSITIVE Sensitive     CEFTRIAXONE <=1 SENSITIVE Sensitive     CIPROFLOXACIN <=0.25 SENSITIVE Sensitive     GENTAMICIN <=1 SENSITIVE Sensitive     IMIPENEM 1 SENSITIVE Sensitive     NITROFURANTOIN 128 RESISTANT Resistant     TRIMETH/SULFA <=20 SENSITIVE Sensitive     AMPICILLIN/SULBACTAM <=2 SENSITIVE  Sensitive     PIP/TAZO <=4 SENSITIVE Sensitive     * 80,000 COLONIES/mL PROTEUS MIRABILIS  Blood Culture (routine x 2)     Status: Abnormal   Collection Time: 05/20/16 12:08 AM  Result Value Ref Range Status   Specimen Description BLOOD RIGHT HAND  Final   Special Requests BOTTLES DRAWN AEROBIC AND ANAEROBIC 6CC EACH  Final   Culture  Setup Time   Final    GRAM POSITIVE COCCI IN CLUSTERS AEROBIC BOTTLE ONLY Gram Stain Report Called to,Read Back By and Verified With: SPANGLER E. AT 1145A ON [id number] BY THOMPSON S. CRITICAL RESULT CALLED TO, READ BACK BY AND VERIFIED WITH: E SPANGLER,PHARMD AT 1740 05/20/16 BY Elson Clan Performed at [facility]    Culture STAPHYLOCOCCUS AUREUS (A)  Final   Report Status 05/22/2016 FINAL  Final   Organism ID, Bacteria STAPHYLOCOCCUS AUREUS  Final      Susceptibility   Staphylococcus aureus - MIC*    CIPROFLOXACIN >=8 RESISTANT Resistant     ERYTHROMYCIN >=8 RESISTANT Resistant     GENTAMICIN <=0.5 SENSITIVE Sensitive     OXACILLIN 0.5 SENSITIVE Sensitive     TETRACYCLINE <=1 SENSITIVE Sensitive     VANCOMYCIN 1 SENSITIVE Sensitive     TRIMETH/SULFA <=10 SENSITIVE Sensitive  CLINDAMYCIN <=0.25 SENSITIVE Sensitive     RIFAMPIN <=0.5 SENSITIVE Sensitive     Inducible Clindamycin NEGATIVE Sensitive     * STAPHYLOCOCCUS AUREUS  Blood Culture ID Panel (Reflexed)     Status: Abnormal   Collection Time: 05/20/16 12:08 AM  Result Value Ref Range Status   Enterococcus species NOT DETECTED NOT DETECTED Final   Vancomycin resistance NOT DETECTED NOT DETECTED Final   Listeria monocytogenes NOT DETECTED NOT DETECTED Final   Staphylococcus species DETECTED (A) NOT DETECTED Final    Comment: CRITICAL RESULT CALLED TO, READ BACK BY AND VERIFIED WITH: Desma MaximE. Spangler RN 17:40 05/20/16 ([email or web address])    Staphylococcus aureus DETECTED (A) NOT DETECTED Final    Comment: Desma MaximE. Spangler RN 17:40 05/20/16 ([email or web address])   Methicillin resistance NOT DETECTED NOT DETECTED Final    Streptococcus species NOT DETECTED NOT DETECTED Final   Streptococcus agalactiae NOT DETECTED NOT DETECTED Final   Streptococcus pneumoniae NOT DETECTED NOT DETECTED Final   Streptococcus pyogenes NOT DETECTED NOT DETECTED Final   Acinetobacter baumannii NOT DETECTED NOT DETECTED Final   Enterobacteriaceae species NOT DETECTED NOT DETECTED Final   Enterobacter cloacae complex NOT DETECTED NOT DETECTED Final   Escherichia coli NOT DETECTED NOT DETECTED Final   Klebsiella oxytoca NOT DETECTED NOT DETECTED Final   Klebsiella pneumoniae NOT DETECTED NOT DETECTED Final   Proteus species NOT DETECTED NOT DETECTED Final   Serratia marcescens NOT DETECTED NOT DETECTED Final   Carbapenem resistance NOT DETECTED NOT DETECTED Final   Haemophilus influenzae NOT DETECTED NOT DETECTED Final   Neisseria meningitidis NOT DETECTED NOT DETECTED Final   Pseudomonas aeruginosa NOT DETECTED NOT DETECTED Final   Candida albicans NOT DETECTED NOT DETECTED Final   Candida glabrata NOT DETECTED NOT DETECTED Final   Candida krusei NOT DETECTED NOT DETECTED Final   Candida parapsilosis NOT DETECTED NOT DETECTED Final   Candida tropicalis NOT DETECTED NOT DETECTED Final  Blood Culture (routine x 2)     Status: Abnormal   Collection Time: 05/20/16 12:20 AM  Result Value Ref Range Status   Specimen Description BLOOD RIGHT ANTECUBITAL  Final   Special Requests BOTTLES DRAWN AEROBIC ONLY 2CC  Final   Culture  Setup Time   Final    GRAM POSITIVE COCCI Gram Stain Report Called to,Read Back By and Verified With: CHAPEL,R. AT 1305 ON 05/20/2016 BY AGUNDIZ,E. AEROBIC BOTTLE ONLY Performed at [facility]    Culture (A)  Final    STAPHYLOCOCCUS AUREUS SUSCEPTIBILITIES PERFORMED ON PREVIOUS CULTURE WITHIN THE LAST 5 DAYS. [facility]    Report Status 05/22/2016 FINAL  Final  MRSA PCR Screening     Status: None   Collection Time: 05/20/16  4:47 AM  Result Value Ref Range Status   MRSA by  PCR NEGATIVE NEGATIVE Final    Comment:        The GeneXpert MRSA Assay (FDA approved for NASAL specimens only), is one component of a comprehensive MRSA colonization surveillance program. It is not intended to diagnose MRSA infection nor to guide or monitor treatment for MRSA infections.   Culture, blood (routine x 2)     Status: Abnormal   Collection Time: 05/21/16 10:08 AM  Result Value Ref Range Status   Specimen Description BLOOD RIGHT HAND  Final   Special Requests BOTTLES DRAWN AEROBIC AND ANAEROBIC 6CC  Final   Culture  Setup Time   Final    GRAM POSITIVE COCCI Gram Stain Report Called to,Read Back By and  Verified With: SMITH,K AT 1159 ON 05/21/2016 BY AGUNDIZ,E. IN BOTH AEROBIC AND ANAEROBIC BOTTLES    Culture (A)  Final    STAPHYLOCOCCUS AUREUS SUSCEPTIBILITIES PERFORMED ON PREVIOUS CULTURE WITHIN THE LAST 5 DAYS. Performed at [facility]    Report Status 05/24/2016 FINAL  Final  Culture, blood (routine x 2)     Status: Abnormal   Collection Time: 05/21/16 10:17 AM  Result Value Ref Range Status   Specimen Description BLOOD LEFT HAND  Final   Special Requests BOTTLES DRAWN AEROBIC AND ANAEROBIC 6CC  Final   Culture  Setup Time   Final    GRAM POSITIVE COCCI Gram Stain Report Called to,Read Back By and Verified With: SMITH,K. AT 1159 ON 05/21/2016 BY AGUNDIZ,E. IN BOTH AEROBIC AND ANAEROBIC BOTTLES    Culture (A)  Final    STAPHYLOCOCCUS AUREUS SUSCEPTIBILITIES PERFORMED ON PREVIOUS CULTURE WITHIN THE LAST 5 DAYS. Performed [facility]    Report Status 05/24/2016 FINAL  Final  Culture, blood (routine x 2)     Status: None (Preliminary result)   Collection Time: 05/22/16 12:26 PM  Result Value Ref Range Status   Specimen Description BLOOD PICC LINE  Final   Special Requests BOTTLES DRAWN AEROBIC AND ANAEROBIC 6CC EACH  Final   Culture NO GROWTH < 24 HOURS  Final   Report Status PENDING  Incomplete  Culture, blood (routine x 2)     Status:  None (Preliminary result)   Collection Time: 05/22/16 12:30 PM  Result Value Ref Range Status   Specimen Description BLOOD RIGHT HAND  Final   Special Requests BOTTLES DRAWN AEROBIC AND ANAEROBIC 7CC EACH  Final   Culture NO GROWTH < 24 HOURS  Final   Report Status PENDING  Incomplete   Antimicrobials this admission: levaquin 6/23 >>6/24 vanc 6/23  >> 6/24 Aztreonam 6/23>>6/24 Cefazolin  6/24>>  Thank you for allowing pharmacy to be a part of this patient's care.  Valrie HartHall, Zaylia Riolo A 05/24/2016 1:37 PM

## 2016-05-24 NOTE — Progress Notes (Addendum)
Physician Discharge Summary  BOLDEN HAGERMAN WUJ:[id number] DOB: [birth year] DOA: 05/19/2016  PCP: Dwana Melena, MD  Admit date: 05/19/2016 Discharge date: 05/24/2016  Time spent: Greater than 30 minutes  Recommendations for Outpatient Follow-up:  1. Patient will need to follow-up with urologist, Marcine Matar, MD for outpatient lithotripsy. His office will arrange, but the skilled nursing facility should call #[phone] if there is no follow-up in one week.Patient was discharged with a Foley catheter in place. 2. Patient is being discharged with IV cefazolin which should continue through the last dose 06/17/16. Repeat blood cultures on 05/22/16 are pending, but negative to date. 3. Recommend follow-up of the patient's CBC in 5-7 days. Consider referral to hematology/oncology if his platelet count is less than 70,000. A number of labs were pending including protein electrophoresis and serotonin release assay 4. Patient is being discharged to a [facility] skilled nursing facility for rehabilitation.    Discharge Diagnoses:  1. Septic shock with lasted acidosis associated with MSSA bacteremia and Proteus UTI. 2. Urinary tract infection due to pyelonephritis secondary to Proteus. 3. History of right hydronephrosis, status post ureteral stent 03/17/16. 4. Acute hypoxic respiratory failure, secondary to sepsis. Resolved. 5. Elevated troponin I, secondary to demand ischemia in sepsis. 6. History of CAD/status post CABG/hypertension. 7. Chronic atrial fibrillation. 8. Stage III to stage IV chronic kidney disease. 9. Thrombocytopenia of unknown etiology. 10. Type 2 diabetes mellitus with nephropathy. 11. Right proximal leg cramping. 12. Normocytic anemia secondary to chronic kidney disease. 13. Syncope and collapse prior to hospitalization, secondary to #1.  Discharge Condition: Stable  Diet recommendation: Heart healthy/carbohydrate modified.  Filed Weights   05/22/16 1404 05/23/16 0627 05/24/16  0647  Weight: 87.1 kg (192 lb 0.3 oz) 85.9 kg (189 lb 6 oz) 86.2 kg (190 lb 0.6 oz)    History of present illness:  Patient is a 82 year old man with a history of right hydronephrosis, status post stent April 2017, chronic atrial fibrillation, chronic diastolic congestive heart failure, and diabetes mellitus, who presented to the ED after being found down and unresponsive at the ALF. Reportedly, he had several episodes of emesis. In the ED, he was found to be febrile, tachycardic, tachypnea, hypotensive, and hypoxic. His lab data were significant for a creatinine of 1.61, troponin I of 0.07, lactic acid of 262, BNP of 709, WBC of 11.7, and platelet count of 111. His urinalysis revealed too numerous to count WBCs. He was admitted for further evaluation and management of septic shock and UTI.   Blood cultures were positive for MSSA and the urine culture was positive for Proteus. Remotely, ID Dr. Orvan Falconer started cefazolin to cover both. 2-D echocardiogram revealed no obvious vegetations. The family did not want invasive diagnostic evaluation and Dr. Orvan Falconer felt it was reasonable not to do the TEE, but would treat the bacteremia with 4 weeks of cefazolin.   Hospital Course:  1. Septic shock/lactic acidosis associated with MSSA bacteremia and Proteus UTI. On admission, the patient was febrile with a temperature of greater than 103. His white blood cell count was 11.7, but it increased to 27.2. Blood cultures and a urine culture ordered on admission. -Patient was started on broad-spectrum antibiotics with vancomycin, aztreonam, and Levaquin. Neo-Synephrine had to be started and was given for approx. 24 hours before it was discontinued. IV Solu-Cortef was given 24 hours. -His blood cultures grew out MSSA and urine culture grew out Proteus. ID, Dr. Orvan Falconer remotely narrowed antibiotic therapy to cefazolin which covered both bacteria. Discussed further with  Dr. Orvan Falconer. The family did not desire  invasive procedures like TEE, therefore, both Dr. Orvan Falconer and I thought that it would be appropriate to treat the patient  empirically with 4 weeks of IV cefazolin. -Patient's fever resolved. His white blood cell count completely normalized. -He should be continued on treatment with cefazolin through the last dose on 06/17/16.  Urinary tract infection, likely polynephritis secondary to Proteus. History of right hydronephrosis status post ureteral stent 03/17/16. Indwelling Foley catheter was placed. His urinalysis was positive. Urine culture grew out Proteus which it did in 02/2006. Patient was started on antibiotics as above. Apparently the EDP discussed the patient with urology on-call on admission and there was no plan to remove the stent. Therapy narrowed to cefazolin per ID Dr. Orvan Falconer. Dr. Orvan Falconer recommended that if lithotripsy is planned, that it be done while the patient is on IV cefazolin. -Discussed patient with his urologist, Dr. Retta Diones who said that he would arrange for the patient to have lithotripsy. Patient was discharged with a Foley catheter in place until he is seen by Dr. Retta Diones in follow-up.   Acute respiratory failure with hypoxia. Patient's oxygen saturation was in the 80s intermittently in the ED. However, on 1.5 L of oxygen, his ABG revealed PO2 of 85. The transient hypoxia was likely from septic shock. His oxygen saturations have improved. He was oxygenating in the upper 90s on room air at the time of discharge.  Elevated troponin I in a patient with CAD/status post CABG. Patient troponin I was 0.07 on admission, but it trended up to 0.27.EKG revealed atrial fibrillation with right bundle branch block. The patient denied chest pain. Elevation was likely from demand ischemia as a consequence of sepsis. -He is treated chronically with pindolol and aspirin. Aspirin was continued. Pindolol was held due to hypotension. Pindolol was restarted when his blood pressure  improved. -His echo revealed an EF of 55-60% and no wall motion abnormality.  Chronic atrial fibrillation. Patient is treated with aspirin and pindolol. His rate was elevated on on admission due to sepsis. -Pindolol was was restarted following improving his blood pressure.  Chronic diastolic heart failure. Patient is treated with Lasix. Lasix was held secondary to treatment of septic shock. It was restarted at discharge when his blood pressure improved. There was no evidence of decompensated heart failure. -2-D echocardiogram revealed an EF of 55-60%; diastolic function was unable to be assessed secondary to atrial fibrillation.  Stage III to stage IV chronic kidney disease. Patient's baseline creatinine appears to be 1.8, but has ranged from 1.5-2.1 over the past 6 months. Creatinine was 1.61 on admission. It had trended up to 1.8. Urine output was nonoliguric. He was started on IV fluids as above. His renal function progressively improved and was 1.26 at the time of discharge.  Thrombocytopenia. Patient's platelet count was 111 on admission. He was started on subcutaneous heparin for DVT prophylaxis with caution. -Heparin was discontinued on 05/21/16 and Lovenox started. -Due to continued decrease in his platelet count, Lovenox was discontinued on 05/22/16 and start SCDs. -His vitamin B12 level and TSH were within normal limits. -Hematology was consulted. They noted that the patient had thrombocytopenia dating back to 2013 and normocytic anemia dating back to 2009. They ordered a number of studies. HIV was negative. Acute hepatitis panel was negative. IgG/IgA/IgM was within normal limits. Haptoglobin was marginally elevated. LDH was within normal limits. HIT panel was negative. Serotonin release assay and protein electrophoresis were still pending. -Patient's platelet count decreased to  a nadir of 65, but started trending back up to 71. Recommend follow-up CBC in 1 week. If his platelet count  has not improved, consider outpatient hematology follow-up.  Normocytic anemia, likely secondary to acute illness and chronic kidney disease. Studies for evaluation revealed a normal B12, normal ferritin, elevated erythropoietin. The decrease in his hemoglobin was in part hemodilutional. It trended down to 9.7 prior to discharge. Recommend follow-up of his CBC in 1 week or less.  Type 2 diabetes mellitus with nephropathy. Patient is treated with Januvia and metformin chronically. Both were initially withheld. Sliding scale NovoLog was started. His CBGs were controlled. His hemoglobin A1c was 6.6. Januvia was restarted, but metformin was not due to chronic kidney disease. Sliding scale NovoLog was added.  Right leg cramping Voltaren was started twice a day and added gabapentin twice a day when necessary. The leg cramping did limit his ability to ambulate, but when he was not having a leg cramp, he was more willing to have therapy.  Procedures: PICC line right upper extremity on 05/22/16  Echo 05/21/16: Study Conclusions - Left ventricle: The cavity size was normal. Wall thickness was  increased in a pattern of moderate LVH. Systolic function was  normal. The estimated ejection fraction was in the range of 55%  to 60%. Wall motion was normal; there were no regional wall  motion abnormalities. The study was not technically sufficient to  allow evaluation of LV diastolic dysfunction due to atrial  fibrillation. - Aortic valve: Valve area (VTI): 1.7 cm^2. Valve area (Vmax): 1.73  cm^2. - Mitral valve: Moderately calcified annulus. Mildly thickened  leaflets . There was mild regurgitation. - Left atrium: The atrium was moderately dilated. - Right ventricle: RV poorly visualized, grossly appears at least  moderately enlarged. Systolic function was moderately to severely  reduced. TAPSE: 10 mm . - Right atrium: The atrium was severely dilated. - Pulmonary arteries: PASP is at lest  moderately elevated at  40mmHg. Cannot estimate RA pressure, IVC not visualized. Technically difficult study.   Consultations:  Hematology/oncology, Dr. Galen ManilaPenland (Mr. Jacalyn LefevreKefalas PA)  [facility]  Urology, Dr. Retta Dionesahlstedt  Discharge Exam: Filed Vitals:   05/23/16 2047 05/24/16 0647  BP: 122/64 127/74  Pulse: 98 88  Temp: 98 F (36.7 C) 98.5 F (36.9 C)  Resp: 18 18   General exam: 82 year old elderly Caucasian man in no acute distress. Respiratory system: A few bibasilar crackles. Respiratory effort normal. Cardiovascular system: Irregular, irregular with a soft systolic murmur and borderline tachycardia. No JVD, murmurs, rubs, gallops or clicks. No pedal edema. Gastrointestinal system: Abdomen is nondistended, soft and nontender. No organomegaly or masses felt. Normal bowel sounds heard. Central nervous system: Alert and oriented. No focal neurological deficits, but very hard of hearing. Extremities: Symmetric. No acute hot red joints. Skin: No rashes, lesions or ulcers Psychiatry: Judgement and insight appear normal. Pleasant affect. Speech is clear. He is very hard of hearing.  Discharge Instructions   Discharge Instructions    Diet - low sodium heart healthy    Complete by:  As directed      Increase activity slowly    Complete by:  As directed           Current Discharge Medication List    START taking these medications   Details  ceFAZolin (ANCEF) 2-4 GM/100ML-% IVPB Inject 100 mLs (2 g total) into the vein every 12 (twelve) hours. Give the last dose on 06/17/16.    diclofenac sodium (VOLTAREN) 1 %  GEL Apply 2 g topically 2 (two) times daily. Applied to proximal right leg front and back (quadriceps and hamstrings).    !! gabapentin (NEURONTIN) 100 MG capsule Take 1 capsule (100 mg total) by mouth 2 (two) times daily as needed (Muscle cramping).    insulin aspart (NOVOLOG) 100 UNIT/ML injection Sliding scale NovoLog 3 times daily: CBG 70-120 no units.  CBG 121-150 give 2 units. CBG 151-200 give 3 units. CBG 201-250 give 5 units. CBG 251-300 give 8 units. CBG 301-350 give 11 units. CBG  351-400 give 15 units.     !! - Potential duplicate medications found. Please discuss with provider.    CONTINUE these medications which have CHANGED   Details  HYDROcodone-acetaminophen (NORCO/VICODIN) 5-325 MG tablet Take 1 tablet by mouth every 4 (four) hours as needed for moderate pain. Qty: 30 tablet, Refills: 0      CONTINUE these medications which have NOT CHANGED   Details  aspirin EC 81 MG tablet Take 81 mg by mouth every evening.     beta carotene w/minerals (OCUVITE) tablet Take 1 tablet by mouth every evening.     dutasteride (AVODART) 0.5 MG capsule Take 0.5 mg by mouth every evening.     Emollient (EUCERIN) lotion Apply 1 Bottle topically 2 (two) times daily as needed for dry skin.    furosemide (LASIX) 20 MG tablet Take 1 tablet (20 mg total) by mouth daily. Qty: 10 tablet, Refills: 0    !! gabapentin (NEURONTIN) 100 MG capsule Take 100 mg by mouth at bedtime.    mirabegron ER (MYRBETRIQ) 50 MG TB24 tablet Take 50 mg by mouth daily.    Multiple Vitamin (DAILY VITE PO) Take 1 tablet by mouth daily.    omeprazole (PRILOSEC) 40 MG capsule Take 40 mg by mouth daily.    pindolol (VISKEN) 5 MG tablet Take 2.5 mg by mouth daily.     sitaGLIPtin (JANUVIA) 100 MG tablet Take 100 mg by mouth every evening.      !! - Potential duplicate medications found. Please discuss with provider.    STOP taking these medications     metFORMIN (GLUCOPHAGE) 500 MG tablet        Allergies  Allergen Reactions  . Acetaminophen-Codeine     Unknown reaction-not listed on current MAR  . Advil [Ibuprofen] Other (See Comments)    Unknown reaction  . Penicillins     Unknown reaction-not listed on current MAR   Follow-up Information    Follow up with DAHLSTEDT, Bertram Millard, MD.   Specialty:  Urology   Why:  Please call our office within a week after  discharge to set up a follow-up in the office with eventual stone treatment.   Contact information:   [address] [place] [address] [phone]        The results of significant diagnostics from this hospitalization (including imaging, microbiology, ancillary and laboratory) are listed below for reference.    Significant Diagnostic Studies: Dg Chest 1 View  05/22/2016  CLINICAL DATA:  PICC adjustment EXAM: CHEST 1 VIEW COMPARISON:  Chest radiograph from earlier today. FINDINGS: Right PICC terminates in the lower third of the superior vena cava. Sternotomy wires appear aligned and intact. CABG clips overlie the mediastinum. Stable cardiomediastinal silhouette with mild cardiomegaly. No pneumothorax. No pleural effusion. No overt pulmonary edema. Mild bibasilar scarring versus atelectasis. IMPRESSION: 1. Right PICC terminates in the lower third of the superior vena cava. 2. Stable mild cardiomegaly without overt pulmonary  edema. 3. Mild bibasilar scarring versus atelectasis. Electronically Signed   By: Delbert Phenix M.D.   On: 05/22/2016 13:50   Dg Chest 1 View  05/22/2016  CLINICAL DATA:  PICC line placement EXAM: CHEST 1 VIEW COMPARISON:  05/20/2016 FINDINGS: Cardiomegaly again noted. There is right arm PICC line with tip in right atrium. No pneumothorax. Mild perihilar interstitial prominence suspicious for mild interstitial edema. No segmental infiltrate. Status post CABG free IMPRESSION: Right arm PICC line with tip in right atrium. No pneumothorax. Mild perihilar interstitial prominence suspicious for mild interstitial edema. Electronically Signed   By: Natasha Mead M.D.   On: 05/22/2016 12:06   Ct Head Wo Contrast  05/20/2016  CLINICAL DATA:  Acute onset of altered mental status. Status post syncope. Patient unresponsive. Fever and vomiting. Initial encounter. EXAM: CT HEAD WITHOUT CONTRAST CT CERVICAL SPINE WITHOUT CONTRAST TECHNIQUE: Multidetector CT imaging of the head and  cervical spine was performed following the standard protocol without intravenous contrast. Multiplanar CT image reconstructions of the cervical spine were also generated. COMPARISON:  CT of the head performed 09/28/2014 FINDINGS: CT HEAD FINDINGS There is no evidence of acute infarction, mass lesion, or intra- or extra-axial hemorrhage on CT. Prominence of ventricles and sulci reflects moderate cortical volume loss. Mild cerebellar atrophy is noted. Scattered periventricular and subcortical white matter change likely reflects small vessel ischemic microangiopathy. A chronic infarct is noted at the medial left occipital lobe, with associated encephalomalacia. The brainstem and fourth ventricle are within normal limits. The basal ganglia are unremarkable in appearance. No mass effect or midline shift is seen. There is no evidence of fracture; visualized osseous structures are unremarkable in appearance. The orbits are within normal limits. There is mild partial opacification of the right mastoid air cells. The paranasal sinuses and left mastoid air cells are well-aerated. No significant soft tissue abnormalities are seen. CT CERVICAL SPINE FINDINGS There is no evidence of acute fracture or subluxation. Vertebral bodies demonstrate normal height. There is minimal grade 1 anterolisthesis of C3 on C4, and minimal grade 1 anterolisthesis of C6 on C7. Multilevel disc space narrowing is noted along the mid cervical spine, with anterior and posterior disc osteophyte complexes. Underlying facet disease is noted along the cervical spine. Prevertebral soft tissues are within normal limits. A 4.2 cm hypodensity is suggested at the left thyroid lobe. Mild interstitial prominence is noted at the lung apices. Scattered calcification is seen at the carotid bifurcations bilaterally. IMPRESSION: 1. No evidence of traumatic intracranial injury or fracture. 2. No evidence of acute fracture or subluxation along the cervical spine. 3.  Moderate cortical volume loss and scattered small vessel ischemic microangiopathy. 4. 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 5. Degenerative change along the cervical spine. 6. 4.2 cm hypodensity suggested at the left thyroid lobe. Consider further evaluation with thyroid ultrasound, as deemed clinically appropriate. If patient is clinically hyperthyroid, consider nuclear medicine thyroid uptake and scan. 7. Mild interstitial prominence at the lung apices. 8. Scattered calcification at the carotid bifurcations bilaterally. Carotid ultrasound would be helpful for further evaluation, when and as deemed clinically appropriate. 9. Mild partial opacification of the right mastoid air cells. Electronically Signed   By: Roanna Raider M.D.   On: 05/20/2016 02:18   Ct Cervical Spine Wo Contrast  05/20/2016  CLINICAL DATA:  Acute onset of altered mental status. Status post syncope. Patient unresponsive. Fever and vomiting. Initial encounter. EXAM: CT HEAD WITHOUT CONTRAST CT CERVICAL SPINE WITHOUT CONTRAST TECHNIQUE:  Multidetector CT imaging of the head and cervical spine was performed following the standard protocol without intravenous contrast. Multiplanar CT image reconstructions of the cervical spine were also generated. COMPARISON:  CT of the head performed 09/28/2014 FINDINGS: CT HEAD FINDINGS There is no evidence of acute infarction, mass lesion, or intra- or extra-axial hemorrhage on CT. Prominence of ventricles and sulci reflects moderate cortical volume loss. Mild cerebellar atrophy is noted. Scattered periventricular and subcortical white matter change likely reflects small vessel ischemic microangiopathy. A chronic infarct is noted at the medial left occipital lobe, with associated encephalomalacia. The brainstem and fourth ventricle are within normal limits. The basal ganglia are unremarkable in appearance. No mass effect or midline shift is seen. There is no  evidence of fracture; visualized osseous structures are unremarkable in appearance. The orbits are within normal limits. There is mild partial opacification of the right mastoid air cells. The paranasal sinuses and left mastoid air cells are well-aerated. No significant soft tissue abnormalities are seen. CT CERVICAL SPINE FINDINGS There is no evidence of acute fracture or subluxation. Vertebral bodies demonstrate normal height. There is minimal grade 1 anterolisthesis of C3 on C4, and minimal grade 1 anterolisthesis of C6 on C7. Multilevel disc space narrowing is noted along the mid cervical spine, with anterior and posterior disc osteophyte complexes. Underlying facet disease is noted along the cervical spine. Prevertebral soft tissues are within normal limits. A 4.2 cm hypodensity is suggested at the left thyroid lobe. Mild interstitial prominence is noted at the lung apices. Scattered calcification is seen at the carotid bifurcations bilaterally. IMPRESSION: 1. No evidence of traumatic intracranial injury or fracture. 2. No evidence of acute fracture or subluxation along the cervical spine. 3. Moderate cortical volume loss and scattered small vessel ischemic microangiopathy. 4. 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 5. Degenerative change along the cervical spine. 6. 4.2 cm hypodensity suggested at the left thyroid lobe. Consider further evaluation with thyroid ultrasound, as deemed clinically appropriate. If patient is clinically hyperthyroid, consider nuclear medicine thyroid uptake and scan. 7. Mild interstitial prominence at the lung apices. 8. Scattered calcification at the carotid bifurcations bilaterally. Carotid ultrasound would be helpful for further evaluation, when and as deemed clinically appropriate. 9. Mild partial opacification of the right mastoid air cells. Electronically Signed   By: Roanna Raider M.D.   On: 05/20/2016 02:18   Ct Abdomen Pelvis W  Contrast  05/20/2016  CLINICAL DATA:  82 year old male with altered mental status syncope. Patient complaining of abdominal pain. EXAM: CT ABDOMEN AND PELVIS WITH CONTRAST TECHNIQUE: Multidetector CT imaging of the abdomen and pelvis was performed using the standard protocol following bolus administration of intravenous contrast. CONTRAST:  ISOVUE-300 IOPAMIDOL (ISOVUE-300) INJECTION 61% COMPARISON:  Abdominal CT dated 03/22/2016 FINDINGS: Evaluation is limited due to streak artifact caused by patient's arms. Bibasilar linear atelectasis/ scarring. The visualized lung bases are otherwise clear. Mild cardiomegaly. Calcified mitral annulus. Coronary vascular calcification and CABG clips. No intra-abdominal free air or free fluid. Cholecystectomy. The liver, pancreas, spleen, adrenal glands appear unremarkable. There is a right ureteral pigtail stent with proximal tip in the right renal pelvis and distal end within the urinary bladder. There is mild fullness of the right renal pelvis with parent urothelial enhancement. Correlation with urinalysis recommended to exclude UTI. There is excreted contrast versus a 4 mm nonobstructing right renal upper pole calculus. A 3 mm nonobstructing left renal interpolar calculus noted. A 1.8 cm left renal interpolar hypodense lesion  is not well characterized but likely represents a cyst. There is no hydronephrosis on the left. There is slight heterogeneous enhancement of the left kidney. Correlation with urinalysis recommended. The left ureter appears unremarkable. The urinary bladder is decompressed around a Foley catheter. There is sigmoid diverticulosis without active inflammatory changes. There is no evidence of bowel obstruction. Fluid within the distal esophagus likely related to gastroesophageal reflux. There is apparent diffuse thickening of the wall of the stomach likely related to underdistention. Gastritis is less likely but not excluded. Clinical correlation is  recommended. Normal appendix. There is advanced aortoiliac atherosclerotic disease. The IVC appears unremarkable. No portal venous gas identified. There is no adenopathy. There is a small fat containing left inguinal hernia. Small fat containing umbilical hernia is also noted. The abdominal wall soft tissues are otherwise unremarkable. There is osteopenia with multilevel degenerative changes of the spine. Lower lumbar laminectomy and L4-L5 disc spacer and posterior fusion hardware noted. No acute fracture. IMPRESSION: Colonic diverticulosis. No evidence of bowel obstruction. Normal appendix. Under distention of the stomach versus less likely gastritis. Clinical correlation is recommended. Interval placement of a right sided ureteral stent. There is mild fullness of the right renal pelvis with urothelial enhancement. Correlation with urinalysis recommended to exclude UTI. Excreted intravenous contrast versus a nonobstructing 4 mm right renal upper pole calculus. Electronically Signed   By: Elgie Collard M.D.   On: 05/20/2016 02:21   Dg Chest Port 1 View  05/20/2016  CLINICAL DATA:  82 year old male with shortness of breath EXAM: PORTABLE CHEST 1 VIEW COMPARISON:  Chest radiograph dated 03/22/2016 FINDINGS: Single portable view of the chest demonstrates mild cardiomegaly with increased central vascular prominence compatible with mild congestive changes. There is no focal consolidation, pleural effusion, or pneumothorax. Median sternotomy wires and CABG vascular clips. No acute osseous pathology. IMPRESSION: Mild cardiomegaly with mild congestion.  No focal consolidation. Electronically Signed   By: Elgie Collard M.D.   On: 05/20/2016 00:37   Dg Chest Port 1v Same Day  05/20/2016  CLINICAL DATA:  82 year old male with central line placement. EXAM: PORTABLE CHEST 1 VIEW COMPARISON:  Chest radiograph dated 05/20/2016 FINDINGS: There has been interval placement of a right IJ central venous catheter with tip  over central SVC. No pneumothorax. There is cardiomegaly with mild congestive changes and mild bronchiectasis. No focal consolidation, pleural effusion, or pneumothorax. The mass now male wires and CABG vascular clips. No acute osseous pathology. IMPRESSION: Right IJ central line with tip over central SVC.  No pneumothorax. Electronically Signed   By: Elgie Collard M.D.   On: 05/20/2016 04:39   Dg Hip Port Unilat With Pelvis 1v Right  05/23/2016  CLINICAL DATA:  Hypersensitive to touch right lower extremity. Denies recent injury or fall. Right hip pain with difficulty straightening length. EXAM: DG HIP (WITH OR WITHOUT PELVIS) 1V PORT RIGHT COMPARISON:  CT 05/20/2016 FINDINGS: There mild symmetric degenerative changes of the hips. There is no acute fracture or dislocation. Fusion hardware intact over the lumbosacral spine. Partially visualized right internal ureteral stent which appears in adequate position. Small caliber rectal catheter present. IMPRESSION: No acute findings. Mild symmetric degenerative change of the hips. Electronically Signed   By: Elberta Fortis M.D.   On: 05/23/2016 13:51    Microbiology: Recent Results (from the past 240 hour(s))  Urine culture     Status: Abnormal   Collection Time: 05/19/16 11:59 PM  Result Value Ref Range Status   Specimen Description URINE, CLEAN CATCH  Final  Special Requests NONE  Final   Culture 80,000 COLONIES/mL PROTEUS MIRABILIS (A)  Final   Report Status 05/22/2016 FINAL  Final   Organism ID, Bacteria PROTEUS MIRABILIS (A)  Final      Susceptibility   Proteus mirabilis - MIC*    AMPICILLIN >=32 RESISTANT Resistant     CEFAZOLIN <=4 SENSITIVE Sensitive     CEFTRIAXONE <=1 SENSITIVE Sensitive     CIPROFLOXACIN <=0.25 SENSITIVE Sensitive     GENTAMICIN <=1 SENSITIVE Sensitive     IMIPENEM 1 SENSITIVE Sensitive     NITROFURANTOIN 128 RESISTANT Resistant     TRIMETH/SULFA <=20 SENSITIVE Sensitive     AMPICILLIN/SULBACTAM <=2 SENSITIVE  Sensitive     PIP/TAZO <=4 SENSITIVE Sensitive     * 80,000 COLONIES/mL PROTEUS MIRABILIS  Blood Culture (routine x 2)     Status: Abnormal   Collection Time: 05/20/16 12:08 AM  Result Value Ref Range Status   Specimen Description BLOOD RIGHT HAND  Final   Special Requests BOTTLES DRAWN AEROBIC AND ANAEROBIC 6CC EACH  Final   Culture  Setup Time   Final    GRAM POSITIVE COCCI IN CLUSTERS AEROBIC BOTTLE ONLY Gram Stain Report Called to,Read Back By and Verified With: SPANGLER E. AT 1145A ON [id number] BY THOMPSON S. CRITICAL RESULT CALLED TO, READ BACK BY AND VERIFIED WITH: E SPANGLER,PHARMD AT 1740 05/20/16 BY Elson Clan Performed at [facility]    Culture STAPHYLOCOCCUS AUREUS (A)  Final   Report Status 05/22/2016 FINAL  Final   Organism ID, Bacteria STAPHYLOCOCCUS AUREUS  Final      Susceptibility   Staphylococcus aureus - MIC*    CIPROFLOXACIN >=8 RESISTANT Resistant     ERYTHROMYCIN >=8 RESISTANT Resistant     GENTAMICIN <=0.5 SENSITIVE Sensitive     OXACILLIN 0.5 SENSITIVE Sensitive     TETRACYCLINE <=1 SENSITIVE Sensitive     VANCOMYCIN 1 SENSITIVE Sensitive     TRIMETH/SULFA <=10 SENSITIVE Sensitive     CLINDAMYCIN <=0.25 SENSITIVE Sensitive     RIFAMPIN <=0.5 SENSITIVE Sensitive     Inducible Clindamycin NEGATIVE Sensitive     * STAPHYLOCOCCUS AUREUS  Blood Culture ID Panel (Reflexed)     Status: Abnormal   Collection Time: 05/20/16 12:08 AM  Result Value Ref Range Status   Enterococcus species NOT DETECTED NOT DETECTED Final   Vancomycin resistance NOT DETECTED NOT DETECTED Final   Listeria monocytogenes NOT DETECTED NOT DETECTED Final   Staphylococcus species DETECTED (A) NOT DETECTED Final    Comment: CRITICAL RESULT CALLED TO, READ BACK BY AND VERIFIED WITH: Desma Maxim RN 17:40 05/20/16 ([email or web address])    Staphylococcus aureus DETECTED (A) NOT DETECTED Final    Comment: Desma Maxim RN 17:40 05/20/16 ([email or web address])   Methicillin resistance NOT DETECTED NOT DETECTED Final    Streptococcus species NOT DETECTED NOT DETECTED Final   Streptococcus agalactiae NOT DETECTED NOT DETECTED Final   Streptococcus pneumoniae NOT DETECTED NOT DETECTED Final   Streptococcus pyogenes NOT DETECTED NOT DETECTED Final   Acinetobacter baumannii NOT DETECTED NOT DETECTED Final   Enterobacteriaceae species NOT DETECTED NOT DETECTED Final   Enterobacter cloacae complex NOT DETECTED NOT DETECTED Final   Escherichia coli NOT DETECTED NOT DETECTED Final   Klebsiella oxytoca NOT DETECTED NOT DETECTED Final   Klebsiella pneumoniae NOT DETECTED NOT DETECTED Final   Proteus species NOT DETECTED NOT DETECTED Final   Serratia marcescens NOT DETECTED NOT DETECTED Final   Carbapenem resistance NOT DETECTED NOT DETECTED Final   Haemophilus influenzae  NOT DETECTED NOT DETECTED Final   Neisseria meningitidis NOT DETECTED NOT DETECTED Final   Pseudomonas aeruginosa NOT DETECTED NOT DETECTED Final   Candida albicans NOT DETECTED NOT DETECTED Final   Candida glabrata NOT DETECTED NOT DETECTED Final   Candida krusei NOT DETECTED NOT DETECTED Final   Candida parapsilosis NOT DETECTED NOT DETECTED Final   Candida tropicalis NOT DETECTED NOT DETECTED Final  Blood Culture (routine x 2)     Status: Abnormal   Collection Time: 05/20/16 12:20 AM  Result Value Ref Range Status   Specimen Description BLOOD RIGHT ANTECUBITAL  Final   Special Requests BOTTLES DRAWN AEROBIC ONLY 2CC  Final   Culture  Setup Time   Final    GRAM POSITIVE COCCI Gram Stain Report Called to,Read Back By and Verified With: CHAPEL,R. AT 1305 ON 05/20/2016 BY AGUNDIZ,E. AEROBIC BOTTLE ONLY Performed at [facility]    Culture (A)  Final    STAPHYLOCOCCUS AUREUS SUSCEPTIBILITIES PERFORMED ON PREVIOUS CULTURE WITHIN THE LAST 5 DAYS. Performed at [facility]    Report Status 05/22/2016 FINAL  Final  MRSA PCR Screening     Status: None   Collection Time: 05/20/16  4:47 AM  Result Value Ref Range Status   MRSA by  PCR NEGATIVE NEGATIVE Final    Comment:        The GeneXpert MRSA Assay (FDA approved for NASAL specimens only), is one component of a comprehensive MRSA colonization surveillance program. It is not intended to diagnose MRSA infection nor to guide or monitor treatment for MRSA infections.   Culture, blood (routine x 2)     Status: Abnormal   Collection Time: 05/21/16 10:08 AM  Result Value Ref Range Status   Specimen Description BLOOD RIGHT HAND  Final   Special Requests BOTTLES DRAWN AEROBIC AND ANAEROBIC 6CC  Final   Culture  Setup Time   Final    GRAM POSITIVE COCCI Gram Stain Report Called to,Read Back By and Verified With: SMITH,K AT 1159 ON 05/21/2016 BY AGUNDIZ,E. IN BOTH AEROBIC AND ANAEROBIC BOTTLES    Culture (A)  Final    STAPHYLOCOCCUS AUREUS SUSCEPTIBILITIES PERFORMED ON PREVIOUS CULTURE WITHIN THE LAST 5 DAYS. Performed at [facility]    Report Status 05/24/2016 FINAL  Final  Culture, blood (routine x 2)     Status: Abnormal   Collection Time: 05/21/16 10:17 AM  Result Value Ref Range Status   Specimen Description BLOOD LEFT HAND  Final   Special Requests BOTTLES DRAWN AEROBIC AND ANAEROBIC 6CC  Final   Culture  Setup Time   Final    GRAM POSITIVE COCCI Gram Stain Report Called to,Read Back By and Verified With: SMITH,K. AT 1159 ON 05/21/2016 BY AGUNDIZ,E. IN BOTH AEROBIC AND ANAEROBIC BOTTLES    Culture (A)  Final    STAPHYLOCOCCUS AUREUS SUSCEPTIBILITIES PERFORMED ON PREVIOUS CULTURE WITHIN THE LAST 5 DAYS. Performed at [facility]    Report Status 05/24/2016 FINAL  Final  Culture, blood (routine x 2)     Status: None (Preliminary result)   Collection Time: 05/22/16 12:26 PM  Result Value Ref Range Status   Specimen Description BLOOD PICC LINE  Final   Special Requests BOTTLES DRAWN AEROBIC AND ANAEROBIC 6CC EACH  Final   Culture NO GROWTH < 24 HOURS  Final   Report Status PENDING  Incomplete  Culture, blood (routine x 2)     Status:  None (Preliminary result)   Collection Time: 05/22/16 12:30 PM  Result Value Ref Range Status   Specimen Description BLOOD RIGHT HAND  Final   Special Requests BOTTLES DRAWN AEROBIC AND ANAEROBIC [facility]7CC EACH  Final   Culture NO GROWTH < 24 HOURS  Final   Report Status PENDING  Incomplete     Labs: Basic Metabolic Panel:  Recent Labs Lab 05/20/16 0734 05/21/16 0440 05/22/16 0421 05/23/16 0626 05/24/16 0530  NA 134* 134* 132* 135 135  K 3.6 3.7 3.9 3.6 3.7  CL 102 104 105 107 108  CO2 24 22 21* 22 21*  GLUCOSE 188* 123* 133* 132* 131*  BUN 32* 35* 43* 42* 34*  CREATININE 1.64* 1.64* 1.81* 1.49* 1.26*  CALCIUM 8.0* 8.0* 7.9* 7.9* 7.6*   Liver Function Tests:  Recent Labs Lab 05/19/16 2348 05/20/16 0734 05/21/16 0440  AST 26 28 25   ALT 15* 14* 13*  ALKPHOS 72 56 49  BILITOT 1.0 1.2 0.8  PROT 8.0 6.4* 6.0*  ALBUMIN 4.3 3.3* 3.0*   No results for input(s): LIPASE, AMYLASE in the last 168 hours. No results for input(s): AMMONIA in the last 168 hours. CBC:  Recent Labs Lab 05/19/16 2348 05/20/16 0734 05/21/16 0755 05/22/16 0421 05/23/16 0626 05/23/16 1112 05/24/16 0530  WBC 11.7* 27.2* 15.8* 15.2* 10.7*  --  9.6  NEUTROABS 10.8*  --   --   --   --  8.1*  --   HGB 12.5* 10.8* 10.8* 10.4* 9.4*  --  9.7*  HCT 37.3* 32.4* 31.8* 30.9* 27.9*  --  28.3*  MCV 96.6 98.5 96.7 96.0 95.5  --  93.7  PLT 111* 100* 76* 72* 65*  --  71*   Cardiac Enzymes:  Recent Labs Lab 05/19/16 2348 05/21/16 0440 05/22/16 0421  TROPONINI 0.07* 0.27* 0.24*   BNP: BNP (last 3 results)  Recent Labs  03/22/16 0540 05/20/16 0022 05/22/16 0421  BNP 588.0* 709.0* 491.0*    ProBNP (last 3 results) No results for input(s): PROBNP in the last 8760 hours.  CBG:  Recent Labs Lab 05/23/16 1143 05/23/16 1637 05/23/16 2024 05/24/16 0751 05/24/16 1140  GLUCAP 168* 116* 136* 128* 159*       Signed:  Tison Leibold MD.  [facility] 05/24/2016, 2:32 PM

## 2016-05-24 NOTE — Progress Notes (Signed)
Daily Progress Note   Patient Name: [name]       Date: 05/24/2016 DOB: [birth year]  Age: 82 y.o. MRN#: [id number] Attending Physician: Elliot Cousinenise Fisher, MD Primary Care Physician: [name], MD Admit Date: 05/19/2016  Reason for Consultation/Follow-up: Disposition, Establishing goals of care and Psychosocial/spiritual support  Subjective: [name] is resting quietly in bed, he makes eye contact and greets me as I enter. He is pleasantly confused, and is talking about his bill at his assisted-living facility when I share that he will be going to [facility]. We talk about him going to rehab, getting physical therapy and IV antibiotics, also about lithotripsy for his kidney stone. He has no specific complaints at this time.  Call to daughter Nettie ElmSylvia, we talk about his disposition to [facility] skilled nursing home today. We talk about his need for lithotripsy and follow-up with urologist, and I reassure her that this will be set up by the rehab.  We talk about his ability to rehab, and I share my worries about his functional status, and the possibility of functional decline. Nettie ElmSylvia states that she fed him yesterday, and nursing staff has also been feeding [name]. She asks if someone at [facility] will feed him, I share the that absolutely he will be fed. Nettie ElmSylvia states, " I have a feeling that he wont be able to get much stronger.  We discuss the use of Hospice when he is able to return to [facility], she is in agreement.    Length of Stay: 4  Current Medications: Scheduled Meds:  . antiseptic oral rinse  7 mL Mouth Rinse BID  . aspirin EC  81 mg Oral QPM  .  ceFAZolin (ANCEF) IV  2 g Intravenous Q12H  . diclofenac sodium  2 g Topical BID  . dutasteride  0.5 mg Oral QPM  . gabapentin  100 mg Oral QHS   . insulin aspart  0-15 Units Subcutaneous TID WC  . insulin aspart  0-5 Units Subcutaneous QHS  . pantoprazole  40 mg Oral Daily  . pindolol  2.5 mg Oral Daily  . sodium chloride flush  3 mL Intravenous Q12H    Continuous Infusions: . 0.9 % NaCl with KCl 20 mEq / L 40 mL/hr at 05/24/16 0600    PRN Meds: acetaminophen **OR** acetaminophen, gabapentin  Physical Exam  Constitutional: No distress.  Alert oriented to self, greets me and makes eye contact.   HENT:  Head: Normocephalic and atraumatic.  Cardiovascular: Normal rate.   Pulmonary/Chest: Effort normal. No respiratory distress.  Abdominal: Soft. He exhibits no distension. There is no guarding.  Neurological: He is alert.  Skin: Skin is warm and dry.  Multiple areas of bruising BL forearms.   Nursing note and vitals reviewed.           Vital Signs: BP 131/77 mmHg  Pulse 91  Temp(Src) 98.2 F (36.8 C) (Oral)  Resp 18  Ht  (1.702 m)  Wt 86.2 kg (190 lb 0.6 oz)  BMI 29.76 kg/m2  SpO2 96% SpO2: SpO2: 96 % O2 Device: O2 Device: Not Delivered O2 Flow Rate: O2 Flow Rate (L/min): 2 L/min  Intake/output summary:  Intake/Output Summary (Last 24 hours) at 05/24/16 1548 Last data filed at 05/24/16 1300  Gross per 24 hour  Intake 2554.92 ml  Output   2525 ml  Net  29.92 ml   LBM: Last BM Date: 05/23/16 Baseline Weight: Weight: 82.101 kg (181 lb) Most recent weight: Weight: 86.2 kg (190 lb 0.6 oz)       Palliative Assessment/Data:    Flowsheet Rows        Most Recent Value   Intake Tab    Referral Department  Hospitalist   Unit at Time of Referral  Med/Surg Unit   Palliative Care Primary Diagnosis  Sepsis/Infectious Disease   Date Notified  05/22/16   Palliative Care Type  New Palliative care   Reason for referral  Clarify Goals of Care   Date of Admission  05/19/16   Date first seen by Palliative Care  05/23/16   # of days Palliative referral response time  1 Day(s)   # of days IP prior to Palliative  referral  3   Clinical Assessment    Palliative Performance Scale Score  40%   Pain Max last 24 hours  Not able to report   Pain Min Last 24 hours  Not able to report   Dyspnea Max Last 24 Hours  Not able to report   Dyspnea Min Last 24 hours  Not able to report   Psychosocial & Spiritual Assessment    Palliative Care Outcomes    Patient/Family meeting held?  No   Palliative Care Outcomes  Provided psychosocial or spiritual support   Patient/Family wishes: Interventions discontinued/not started   Mechanical Ventilation   Palliative Care follow-up planned  -- [Follow-up while at [facility]]      Patient Active Problem List   Diagnosis Date Noted  . Palliative care encounter   . DNR (do not resuscitate) discussion   . Goals of care, counseling/discussion   . CKD (chronic kidney disease) stage 4, GFR 15-29 ml/min (HCC) 05/21/2016  . HTN (hypertension) 05/21/2016  . Dyslipidemia 05/21/2016  . Normocytic anemia 05/21/2016  . Septic shock (HCC) 05/20/2016  . Pressure ulcer 05/20/2016  . Syncope and collapse 05/20/2016  . Thrombocytopenia (HCC) 05/20/2016  . Acute respiratory failure with hypoxia (HCC) 05/20/2016  . Elevated troponin 05/20/2016  . Staphylococcus aureus bacteremia with sepsis (HCC) 05/20/2016  . Kidney stone 03/22/2016  . Hydroureteronephrosis 03/22/2016  . Acute pyelonephritis 03/22/2016  . Hyperkalemia 03/22/2016  . Chronic diastolic CHF (congestive heart failure) (HCC) 03/22/2016  . Barrett's esophagus 10/10/2013  . GERD (gastroesophageal reflux disease) 10/10/2013  . Dysphagia, unspecified(787.20) 08/13/2013  . SPINAL STENOSIS 02/15/2010  . CORONARY ATHEROSCLEROSIS  NATIVE CORONARY ARTERY 10/31/2008  . Chronic atrial fibrillation (HCC) 10/31/2008  . SCIATICA 07/28/2008  . KNEE, ARTHRITIS, DEGEN./OSTEO 06/11/2008  . DERANGEMENT MENISCUS 06/11/2008  . KNEE PAIN, RIGHT 06/11/2008  . Diabetes mellitus type 2 with complications (HCC) 06/10/2008    Palliative Care  Assessment & Plan   Patient Profile: 82 y.o. male with past medical history of CAD, mixed hyperlipidemia, type II diabetes, a field, hypertension, sleep apnea, right ureteral stent placed April 2017 admitted on 05/19/2016 with septic shock.   Assessment: As above  Recommendations/Plan: Continue to treat the treatable, but no extraordinary measures. Would like to have lithotripsy scheduled, goal is to return to [facility], after [facility] rehab for PT and infusion services for antibiotic.  Daughter Nettie ElmSylvia continues to be open to hospice at [facility] after rehab treatment completed.  Goals of Care and Additional Recommendations:  Limitations on Scope of Treatment: as above  Code Status:    Code Status Orders        Start     Ordered   05/22/16 1225  Do not attempt resuscitation (DNR)   Continuous    Question Answer Comment  In the event of cardiac or respiratory ARREST Do not call a "code blue"   In the event of cardiac or respiratory ARREST Do not perform Intubation, CPR, defibrillation or ACLS   In the event of cardiac or respiratory ARREST Use medication by any route, position, wound care, and other measures to relive pain and suffering. May use oxygen, suction and manual treatment of airway obstruction as needed for comfort.      05/22/16 1224    Code Status History    Date Active Date Inactive Code Status Order ID Comments User Context   05/20/2016  5:09 AM 05/22/2016 12:24 PM Full Code [id number]  Houston SirenPeter Le, MD Inpatient   03/22/2016  9:38 AM 03/24/2016  5:27 PM DNR [id number]  Erick BlinksJehanzeb Memon, MD Inpatient       Prognosis:   < 6 months,   likely based on chronic disease burden, and decreased functional status, to hospital admissions in the last 6 months, bacteremia.  Discharge Planning:  Skilled Nursing Facility for rehab with Palliative care service follow-up  Care plan was discussed with nursing staff, SW, and Dr. Sherrie MustacheFisher on next rounds.   Thank you for allowing the  Palliative Medicine Team to assist in the care of this patient.   Time In: 1440 Time Out: 1505 Total Time 25 minutes  Prolonged Time Billed  no       Greater than 50%  of this time was spent counseling and coordinating care related to the above assessment and plan.  Dove,Tasha A, NP  Please contact Palliative Medicine Team phone at [phone] for questions and concerns.

## 2016-05-24 NOTE — Care Management Note (Signed)
Case Management Note  Patient Details  Name: [name] MRN: [id number] Date of Birth: 1918/02/09     Expected Discharge Date:    05/24/2016              Expected Discharge Plan:  Assisted Living / Rest Home  In-House Referral:  Clinical Social Work  Discharge planning Services  CM Consult  Post Acute Care Choice:  NA Choice offered to:  NA  DME Arranged:    DME Agency:     HH Arranged:    HH Agency:     Status of Service:  Completed, signed off  If discussed at [employer] Meetings, dates discussed:    Additional Comments: Patient d/c today to [facility]. CSW aware and making arrangements.   Sai Moura, Chrystine OilerSharley Diane, RN 05/24/2016, 3:24 PM

## 2016-05-24 NOTE — Progress Notes (Signed)
Physical Therapy Treatment Patient Details Name: Kyle CardGeorge W Parodi MRN: [id number] DOB: 23-Sep-1918 Today's Date: 05/24/2016    History of Present Illness 82 yo M admitted 05/19/2016 due to syncopal episode, and found unresponsive on the floor at his SNF. Pt has also had several episodes of emesis. Dx: Septic shock due to UTI. Staphaureaus bacteremia with sepsis. Elevated troponins likely due to demand ischemia as a consequence of sepsis. Pt is from [facility] & ambulates with a walker at baseline. PMH: GERD, CHF, HTN, Afib, CAD s/p CABG, DM2, ureteral stent, sleep apnea, knee arthroscopy, hernia repair, back surgery.    PT Comments    Pt received in bed, and was agreeable to PT tx.  Pt perseverating on his hearing aids, and his glasses initially during tx, and making sure he doesn't loose them.  Pt needed extensive education on safety with regards to transfers today.  Pt continually asking to hold onto therapist so he could pull himself up, or requesting PT to raise the bed above the arm rests of the chair, so he could slide down into the chair.   Pt continues to require Max A for sit<>stand trials, but unable to lift buttocks off EOB.  Continue to recommend SNF.    Follow Up Recommendations  SNF     Equipment Recommendations  None recommended by PT    Recommendations for Other Services       Precautions / Restrictions Precautions Precautions: Fall Precaution Comments: Pt admitted back in April s/p fall.  Restrictions Weight Bearing Restrictions: No    Mobility  Bed Mobility   Bed Mobility: Supine to Sit     Supine to sit: Max assist;HOB elevated Sit to supine: Max assist;HOB elevated   General bed mobility comments: Pt required 1-step commands for sequencing of task.  Use of bed pad to scoot hips to the EOB - pt unable to push on the bed due to a combination of weakness and decreased cognitive status.  After transfer attempts, pt was total A for sit<>supine and Max A for  supine scoot.   Transfers                 General transfer comment: 4+ attempts to perform sit<>stand transfer with pt unable to push up from the bed or pull up on the RW.  Pt stating he could do it if he had something to pull up on.  Pt then requesting for therapist to raise the bed up above the height of the chair arm rest so he could slide over into the chair.  PT expressed to him that those are not safe options for him to get into the chair.   Ambulation/Gait                 Stairs            Wheelchair Mobility    Modified Rankin (Stroke Patients Only)       Balance                                    Cognition Arousal/Alertness: Awake/alert Behavior During Therapy: WFL for tasks assessed/performed Overall Cognitive Status: Impaired/Different from baseline Area of Impairment: Safety/judgement;Orientation Orientation Level: Disoriented to;Time;Situation   Memory: Decreased recall of precautions   Safety/Judgement: Decreased awareness of safety;Decreased awareness of deficits          Exercises      General Comments  Pertinent Vitals/Pain Pain Assessment: No/denies pain    Home Living                      Prior Function            PT Goals (current goals can now be found in the care plan section) Acute Rehab PT Goals PT Goal Formulation: Patient unable to participate in goal setting Time For Goal Achievement: 06/06/16 Potential to Achieve Goals: Fair Progress towards PT goals: Not progressing toward goals - comment    Frequency  Min 4X/week    PT Plan      Co-evaluation             End of Session Equipment Utilized During Treatment: Gait belt Activity Tolerance: Patient limited by pain Patient left: in bed;with bed alarm set     Time: 1045-1130 PT Time Calculation (min) (ACUTE ONLY): 45 min  Charges:  $Therapeutic Activity: 38-52 mins                    G Codes:      Beth Addalyne Vandehei,  PT, DPT X: [phone]

## 2016-05-25 LAB — SEROTONIN RELEASE ASSAY (SRA)
SRA 100IU/mL UFH Ser-aCnc: 1 % (ref 0–20)
SRA, LOW DOSE HEPARIN: 1 % (ref 0–20)

## 2016-05-27 LAB — CULTURE, BLOOD (ROUTINE X 2)
CULTURE: NO GROWTH
Culture: NO GROWTH

## 2016-06-07 ENCOUNTER — Ambulatory Visit: Payer: Medicare Other | Admitting: Cardiology

## 2016-06-10 ENCOUNTER — Emergency Department (HOSPITAL_COMMUNITY): Payer: Medicare Other

## 2016-06-10 ENCOUNTER — Inpatient Hospital Stay (HOSPITAL_COMMUNITY)
Admission: EM | Admit: 2016-06-10 | Discharge: 2016-06-16 | DRG: 689 | Disposition: A | Discharge status: Hospice | Payer: Medicare Other | Attending: Internal Medicine | Admitting: Internal Medicine

## 2016-06-10 ENCOUNTER — Encounter (HOSPITAL_COMMUNITY): Payer: Self-pay

## 2016-06-10 DIAGNOSIS — R112 Nausea with vomiting, unspecified: Secondary | ICD-10-CM

## 2016-06-10 DIAGNOSIS — R195 Other fecal abnormalities: Secondary | ICD-10-CM

## 2016-06-10 DIAGNOSIS — I482 Chronic atrial fibrillation, unspecified: Secondary | ICD-10-CM | POA: Diagnosis present

## 2016-06-10 DIAGNOSIS — Z833 Family history of diabetes mellitus: Secondary | ICD-10-CM

## 2016-06-10 DIAGNOSIS — D649 Anemia, unspecified: Secondary | ICD-10-CM | POA: Diagnosis present

## 2016-06-10 DIAGNOSIS — Z87891 Personal history of nicotine dependence: Secondary | ICD-10-CM

## 2016-06-10 DIAGNOSIS — I130 Hypertensive heart and chronic kidney disease with heart failure and stage 1 through stage 4 chronic kidney disease, or unspecified chronic kidney disease: Secondary | ICD-10-CM | POA: Diagnosis present

## 2016-06-10 DIAGNOSIS — G9340 Encephalopathy, unspecified: Secondary | ICD-10-CM | POA: Diagnosis present

## 2016-06-10 DIAGNOSIS — N200 Calculus of kidney: Secondary | ICD-10-CM | POA: Diagnosis present

## 2016-06-10 DIAGNOSIS — R778 Other specified abnormalities of plasma proteins: Secondary | ICD-10-CM

## 2016-06-10 DIAGNOSIS — K567 Ileus, unspecified: Secondary | ICD-10-CM | POA: Diagnosis present

## 2016-06-10 DIAGNOSIS — Z794 Long term (current) use of insulin: Secondary | ICD-10-CM

## 2016-06-10 DIAGNOSIS — N390 Urinary tract infection, site not specified: Principal | ICD-10-CM | POA: Diagnosis present

## 2016-06-10 DIAGNOSIS — R109 Unspecified abdominal pain: Secondary | ICD-10-CM

## 2016-06-10 DIAGNOSIS — Z66 Do not resuscitate: Secondary | ICD-10-CM | POA: Diagnosis present

## 2016-06-10 DIAGNOSIS — Z951 Presence of aortocoronary bypass graft: Secondary | ICD-10-CM

## 2016-06-10 DIAGNOSIS — D509 Iron deficiency anemia, unspecified: Secondary | ICD-10-CM | POA: Diagnosis present

## 2016-06-10 DIAGNOSIS — E782 Mixed hyperlipidemia: Secondary | ICD-10-CM | POA: Diagnosis present

## 2016-06-10 DIAGNOSIS — R1110 Vomiting, unspecified: Secondary | ICD-10-CM | POA: Diagnosis not present

## 2016-06-10 DIAGNOSIS — N189 Chronic kidney disease, unspecified: Secondary | ICD-10-CM | POA: Diagnosis present

## 2016-06-10 DIAGNOSIS — I5033 Acute on chronic diastolic (congestive) heart failure: Secondary | ICD-10-CM | POA: Diagnosis not present

## 2016-06-10 DIAGNOSIS — E118 Type 2 diabetes mellitus with unspecified complications: Secondary | ICD-10-CM | POA: Diagnosis not present

## 2016-06-10 DIAGNOSIS — I248 Other forms of acute ischemic heart disease: Secondary | ICD-10-CM | POA: Diagnosis present

## 2016-06-10 DIAGNOSIS — G4730 Sleep apnea, unspecified: Secondary | ICD-10-CM | POA: Diagnosis present

## 2016-06-10 DIAGNOSIS — Z7982 Long term (current) use of aspirin: Secondary | ICD-10-CM

## 2016-06-10 DIAGNOSIS — R0602 Shortness of breath: Secondary | ICD-10-CM

## 2016-06-10 DIAGNOSIS — Z515 Encounter for palliative care: Secondary | ICD-10-CM | POA: Diagnosis present

## 2016-06-10 DIAGNOSIS — I2510 Atherosclerotic heart disease of native coronary artery without angina pectoris: Secondary | ICD-10-CM | POA: Diagnosis present

## 2016-06-10 DIAGNOSIS — F0390 Unspecified dementia without behavioral disturbance: Secondary | ICD-10-CM | POA: Diagnosis present

## 2016-06-10 DIAGNOSIS — B49 Unspecified mycosis: Secondary | ICD-10-CM | POA: Diagnosis not present

## 2016-06-10 DIAGNOSIS — Z79899 Other long term (current) drug therapy: Secondary | ICD-10-CM

## 2016-06-10 DIAGNOSIS — E1122 Type 2 diabetes mellitus with diabetic chronic kidney disease: Secondary | ICD-10-CM | POA: Diagnosis present

## 2016-06-10 DIAGNOSIS — R7989 Other specified abnormal findings of blood chemistry: Secondary | ICD-10-CM | POA: Diagnosis not present

## 2016-06-10 DIAGNOSIS — K219 Gastro-esophageal reflux disease without esophagitis: Secondary | ICD-10-CM | POA: Diagnosis present

## 2016-06-10 HISTORY — DX: Severe sepsis with septic shock: R65.21

## 2016-06-10 HISTORY — DX: Thrombocytopenia, unspecified: D69.6

## 2016-06-10 HISTORY — DX: Syncope and collapse: R55

## 2016-06-10 HISTORY — DX: Respiratory failure, unspecified, unspecified whether with hypoxia or hypercapnia: J96.90

## 2016-06-10 HISTORY — DX: Urinary tract infection, site not specified: N39.0

## 2016-06-10 LAB — URINALYSIS, ROUTINE W REFLEX MICROSCOPIC
Bilirubin Urine: NEGATIVE
GLUCOSE, UA: NEGATIVE mg/dL
NITRITE: NEGATIVE
PH: 5.5 (ref 5.0–8.0)
PROTEIN: 30 mg/dL — AB
Specific Gravity, Urine: 1.025 (ref 1.005–1.030)

## 2016-06-10 LAB — POC OCCULT BLOOD, ED: Fecal Occult Bld: POSITIVE — AB

## 2016-06-10 LAB — COMPREHENSIVE METABOLIC PANEL
ALBUMIN: 2.2 g/dL — AB (ref 3.5–5.0)
ALT: 8 U/L — ABNORMAL LOW (ref 17–63)
AST: 30 U/L (ref 15–41)
Alkaline Phosphatase: 77 U/L (ref 38–126)
Anion gap: 7 (ref 5–15)
BILIRUBIN TOTAL: 0.5 mg/dL (ref 0.3–1.2)
BUN: 24 mg/dL — AB (ref 6–20)
CHLORIDE: 103 mmol/L (ref 101–111)
CO2: 23 mmol/L (ref 22–32)
Calcium: 7.5 mg/dL — ABNORMAL LOW (ref 8.9–10.3)
Creatinine, Ser: 1.23 mg/dL (ref 0.61–1.24)
GFR calc Af Amer: 55 mL/min — ABNORMAL LOW (ref >60)
GFR calc non Af Amer: 48 mL/min — ABNORMAL LOW (ref >60)
GLUCOSE: 237 mg/dL — AB (ref 65–99)
Potassium: 4 mmol/L (ref 3.5–5.1)
Sodium: 133 mmol/L — ABNORMAL LOW (ref 135–145)
Total Protein: 5.4 g/dL — ABNORMAL LOW (ref 6.5–8.1)

## 2016-06-10 LAB — CBC WITH DIFFERENTIAL/PLATELET
BASOS ABS: 0 10*3/uL (ref 0.0–0.1)
Basophils Relative: 0 %
Eosinophils Absolute: 0.1 10*3/uL (ref 0.0–0.7)
Eosinophils Relative: 0 %
HEMATOCRIT: 29.2 % — AB (ref 39.0–52.0)
Hemoglobin: 9.3 g/dL — ABNORMAL LOW (ref 13.0–17.0)
LYMPHS PCT: 9 %
Lymphs Abs: 1.7 10*3/uL (ref 0.7–4.0)
MCH: 31.3 pg (ref 26.0–34.0)
MCHC: 31.8 g/dL (ref 30.0–36.0)
MCV: 98.3 fL (ref 78.0–100.0)
Monocytes Absolute: 1.5 10*3/uL — ABNORMAL HIGH (ref 0.1–1.0)
Monocytes Relative: 8 %
NEUTROS ABS: 16.8 10*3/uL — AB (ref 1.7–7.7)
NEUTROS PCT: 84 %
Platelets: 317 10*3/uL (ref 150–400)
RBC: 2.97 MIL/uL — AB (ref 4.22–5.81)
RDW: 15.7 % — ABNORMAL HIGH (ref 11.5–15.5)
WBC: 20.1 10*3/uL — AB (ref 4.0–10.5)

## 2016-06-10 LAB — TROPONIN I
TROPONIN I: 0.14 ng/mL — AB (ref <0.03)
Troponin I: 0.14 ng/mL (ref <0.03)

## 2016-06-10 LAB — URINE MICROSCOPIC-ADD ON: SQUAMOUS EPITHELIAL / LPF: NONE SEEN

## 2016-06-10 LAB — GLUCOSE, CAPILLARY
GLUCOSE-CAPILLARY: 187 mg/dL — AB (ref 65–99)
Glucose-Capillary: 177 mg/dL — ABNORMAL HIGH (ref 65–99)
Glucose-Capillary: 225 mg/dL — ABNORMAL HIGH (ref 65–99)

## 2016-06-10 LAB — SAMPLE TO BLOOD BANK

## 2016-06-10 LAB — LACTIC ACID, PLASMA
LACTIC ACID, VENOUS: 1.2 mmol/L (ref 0.5–1.9)
Lactic Acid, Venous: 1.2 mmol/L (ref 0.5–1.9)

## 2016-06-10 LAB — LIPASE, BLOOD: Lipase: 21 U/L (ref 11–51)

## 2016-06-10 MED ORDER — ENOXAPARIN SODIUM 40 MG/0.4ML ~~LOC~~ SOLN
40.0000 mg | SUBCUTANEOUS | Status: DC
  Administered 2016-06-10 – 2016-06-13 (×4): 40 mg via SUBCUTANEOUS
  Filled 2016-06-10 (×4): qty 0.4

## 2016-06-10 MED ORDER — SODIUM CHLORIDE 0.9 % IV SOLN
INTRAVENOUS | Status: DC
  Administered 2016-06-10: 75 mL/h via INTRAVENOUS
  Administered 2016-06-11: 06:00:00 via INTRAVENOUS

## 2016-06-10 MED ORDER — PANTOPRAZOLE SODIUM 40 MG IV SOLR
40.0000 mg | Freq: Once | INTRAVENOUS | Status: AC
  Administered 2016-06-10: 40 mg via INTRAVENOUS
  Filled 2016-06-10: qty 40

## 2016-06-10 MED ORDER — INSULIN ASPART 100 UNIT/ML ~~LOC~~ SOLN: 0.0000 [IU] | Freq: Four times a day (QID) | SUBCUTANEOUS | Status: DC

## 2016-06-10 MED ORDER — PROMETHAZINE HCL 25 MG/ML IJ SOLN
12.5000 mg | Freq: Once | INTRAMUSCULAR | Status: AC
  Administered 2016-06-10: 12.5 mg via INTRAVENOUS
  Filled 2016-06-10: qty 1

## 2016-06-10 MED ORDER — SODIUM CHLORIDE 0.9% FLUSH
3.0000 mL | Freq: Two times a day (BID) | INTRAVENOUS | Status: DC
  Administered 2016-06-12 – 2016-06-16 (×5): 3 mL via INTRAVENOUS

## 2016-06-10 MED ORDER — INSULIN ASPART 100 UNIT/ML ~~LOC~~ SOLN: 0.0000 [IU] | Freq: Every day | SUBCUTANEOUS | Status: DC

## 2016-06-10 MED ORDER — INSULIN ASPART 100 UNIT/ML ~~LOC~~ SOLN
0.0000 [IU] | SUBCUTANEOUS | Status: DC
  Administered 2016-06-10 – 2016-06-11 (×2): 3 [IU] via SUBCUTANEOUS
  Administered 2016-06-11 – 2016-06-14 (×5): 2 [IU] via SUBCUTANEOUS
  Administered 2016-06-14: 3 [IU] via SUBCUTANEOUS
  Administered 2016-06-14 – 2016-06-15 (×5): 2 [IU] via SUBCUTANEOUS

## 2016-06-10 MED ORDER — ACETAMINOPHEN 325 MG PO TABS
650.0000 mg | ORAL_TABLET | Freq: Four times a day (QID) | ORAL | Status: DC | PRN
Start: 2016-06-10 — End: 2016-06-16

## 2016-06-10 MED ORDER — ATORVASTATIN CALCIUM 40 MG PO TABS
40.0000 mg | ORAL_TABLET | Freq: Every day | ORAL | Status: DC
  Administered 2016-06-10 – 2016-06-12 (×3): 40 mg via ORAL
  Filled 2016-06-10 (×3): qty 1

## 2016-06-10 MED ORDER — POLYETHYLENE GLYCOL 3350 17 G PO PACK
17.0000 g | PACK | Freq: Every day | ORAL | Status: DC | PRN
  Administered 2016-06-11: 17 g via ORAL
  Filled 2016-06-10: qty 1

## 2016-06-10 MED ORDER — MIRABEGRON ER 25 MG PO TB24
50.0000 mg | ORAL_TABLET | Freq: Every day | ORAL | Status: DC
  Administered 2016-06-11 – 2016-06-12 (×2): 50 mg via ORAL
  Filled 2016-06-10 (×2): qty 2

## 2016-06-10 MED ORDER — CEFTRIAXONE SODIUM 1 G IJ SOLR
1.0000 g | Freq: Once | INTRAMUSCULAR | Status: AC
  Administered 2016-06-10: 1 g via INTRAVENOUS
  Filled 2016-06-10: qty 10

## 2016-06-10 MED ORDER — ACETAMINOPHEN 650 MG RE SUPP
650.0000 mg | Freq: Four times a day (QID) | RECTAL | Status: DC | PRN
Start: 2016-06-10 — End: 2016-06-16

## 2016-06-10 MED ORDER — ASPIRIN EC 81 MG PO TBEC
81.0000 mg | DELAYED_RELEASE_TABLET | Freq: Every evening | ORAL | Status: DC
  Administered 2016-06-10 – 2016-06-12 (×3): 81 mg via ORAL
  Filled 2016-06-10 (×3): qty 1

## 2016-06-10 MED ORDER — LORAZEPAM 2 MG/ML IJ SOLN
0.5000 mg | Freq: Once | INTRAMUSCULAR | Status: AC
  Administered 2016-06-10: 0.5 mg via INTRAVENOUS
  Filled 2016-06-10: qty 1

## 2016-06-10 MED ORDER — LORAZEPAM 2 MG/ML IJ SOLN
0.5000 mg | INTRAMUSCULAR | Status: DC | PRN
Start: 2016-06-10 — End: 2016-06-15
  Administered 2016-06-10 – 2016-06-14 (×8): 0.5 mg via INTRAVENOUS
  Filled 2016-06-10 (×9): qty 1

## 2016-06-10 MED ORDER — DUTASTERIDE 0.5 MG PO CAPS
0.5000 mg | ORAL_CAPSULE | Freq: Every evening | ORAL | Status: DC
  Administered 2016-06-12: 0.5 mg via ORAL
  Filled 2016-06-10 (×7): qty 1

## 2016-06-10 MED ORDER — PINDOLOL 5 MG PO TABS
5.0000 mg | ORAL_TABLET | Freq: Every day | ORAL | Status: DC
  Administered 2016-06-11 – 2016-06-12 (×2): 5 mg via ORAL
  Filled 2016-06-10 (×6): qty 1

## 2016-06-10 MED ORDER — DIATRIZOATE MEGLUMINE & SODIUM 66-10 % PO SOLN
ORAL | Status: AC
  Filled 2016-06-10: qty 30

## 2016-06-10 MED ORDER — GABAPENTIN 100 MG PO CAPS
100.0000 mg | ORAL_CAPSULE | Freq: Every day | ORAL | Status: DC
  Administered 2016-06-10 – 2016-06-12 (×3): 100 mg via ORAL
  Filled 2016-06-10 (×3): qty 1

## 2016-06-10 MED ORDER — PROMETHAZINE HCL 12.5 MG PO TABS
12.5000 mg | ORAL_TABLET | Freq: Four times a day (QID) | ORAL | Status: DC | PRN
  Administered 2016-06-15: 12.5 mg via ORAL
  Filled 2016-06-10: qty 1

## 2016-06-10 NOTE — ED Provider Notes (Signed)
CSN: [id number]     Arrival date & time 06/10/16  1107 History   First MD Initiated Contact with Patient 06/10/16 1135     Chief Complaint  Patient presents with  . Emesis      Patient is a 82 y.o. male presenting with vomiting. The history is provided by the patient, the EMS personnel and the nursing home. The history is limited by the condition of the patient (Hx dementia).  Emesis Pt was seen at 1140.  Per EMS, NH report and pt: Pt c/o abd pain and N/V since yesterday. NH states pt's emesis today was "brown."  NH has been giving zofran without improvement in pt's symptoms. EMS state pt had "projectile vomiting" en route to the ED. No reported fevers, diarrhea, no black/blood in stools. Pt has significant hx of dementia.   Past Medical History  Diagnosis Date  . Coronary atherosclerosis of native coronary artery     Multivessel status post CABG  . Mixed hyperlipidemia   . Type 2 diabetes mellitus (HCC)   . Atrial fibrillation (HCC)   . Essential hypertension, benign   . Sleep apnea   . Hydroureteronephrosis 03/22/2016  . CKD (chronic kidney disease)   . Severe sepsis with septic shock (CODE) (HCC)   . UTI (lower urinary tract infection)   . Respiratory failure (HCC)   . Thrombocytopenia (HCC)   . Syncope and collapse    Past Surgical History  Procedure Laterality Date  . Tonsillectomy    . Cholecystectomy    . Knee arthroscopy    . Hernia repair    . Coronary artery bypass graft  1992    LIMA to LAD and diagonal, SVG to RCA, SVG to OM  . Back surgery    . Esophagogastroduodenoscopy (egd) with esophageal dilation N/A 09/12/2013    Procedure: ESOPHAGOGASTRODUODENOSCOPY (EGD) WITH ESOPHAGEAL DILATION;  Surgeon: Malissa Hippo, MD;  Location: [facility];  Service: Endoscopy;  Laterality: N/A;  . Cystoscopy w/ ureteral stent placement Right 03/22/2016    Procedure: CYSTOSCOPY WITH RIGHT RETROGRADE PYELOGRAM, RIGHT URETERAL STENT PLACEMENT;  Surgeon: Marcine Matar, MD;   Location: [facility];  Service: Urology;  Laterality: Right;   Family History  Problem Relation Age of Onset  . Diabetes     Social History  Substance Use Topics  . Smoking status: Former Smoker    Start date: 11/28/1944    Quit date: 11/29/1963  . Smokeless tobacco: Never Used  . Alcohol Use: No    Review of Systems  Unable to perform ROS: Dementia  Gastrointestinal: Positive for vomiting.      Allergies  Acetaminophen-codeine; Advil; and Penicillins  Home Medications   Prior to Admission medications   Medication Sig Start Date End Date Taking? Authorizing Provider  aspirin EC 81 MG tablet Take 81 mg by mouth every evening.     Historical Provider, MD  beta carotene w/minerals (OCUVITE) tablet Take 1 tablet by mouth every evening.     Historical Provider, MD  ceFAZolin (ANCEF) 2-4 GM/100ML-% IVPB Inject 100 mLs (2 g total) into the vein every 12 (twelve) hours. Give the last dose on 06/17/16. 05/24/16   Elliot Cousin, MD  diclofenac sodium (VOLTAREN) 1 % GEL Apply 2 g topically 2 (two) times daily. Applied to proximal right leg front and back (quadriceps and hamstrings). 05/24/16   Elliot Cousin, MD  dutasteride (AVODART) 0.5 MG capsule Take 0.5 mg by mouth every evening.     Historical Provider, MD  Emollient Jenetta Downer)  lotion Apply 1 Bottle topically 2 (two) times daily as needed for dry skin.    Historical Provider, MD  furosemide (LASIX) 20 MG tablet Take 1 tablet (20 mg total) by mouth daily. 11/20/15   Kristen N Ward, DO  gabapentin (NEURONTIN) 100 MG capsule Take 100 mg by mouth at bedtime. 09/30/14   Historical Provider, MD  gabapentin (NEURONTIN) 100 MG capsule Take 1 capsule (100 mg total) by mouth 2 (two) times daily as needed (Muscle cramping). 05/24/16   Elliot Cousin, MD  HYDROcodone-acetaminophen (NORCO/VICODIN) 5-325 MG tablet Take 1 tablet by mouth every 4 (four) hours as needed for moderate pain. 05/24/16   Elliot Cousin, MD  insulin aspart (NOVOLOG) 100 UNIT/ML  injection Sliding scale NovoLog 3 times daily: CBG 70-120 no units. CBG 121-150 give 2 units. CBG 151-200 give 3 units. CBG 201-250 give 5 units. CBG 251-300 give 8 units. CBG 301-350 give 11 units. CBG  351-400 give 15 units. 05/24/16   Elliot Cousin, MD  mirabegron ER (MYRBETRIQ) 50 MG TB24 tablet Take 50 mg by mouth daily.    Historical Provider, MD  Multiple Vitamin (DAILY VITE PO) Take 1 tablet by mouth daily.    Historical Provider, MD  omeprazole (PRILOSEC) 40 MG capsule Take 40 mg by mouth daily.    Historical Provider, MD  pindolol (VISKEN) 5 MG tablet Take 2.5 mg by mouth daily.  04/22/14   Historical Provider, MD  sitaGLIPtin (JANUVIA) 100 MG tablet Take 100 mg by mouth every evening.     Historical Provider, MD   BP 124/87 mmHg  Pulse 92  Temp(Src) 98.2 F (36.8 C) (Oral)  Resp 16  Ht 5' 7.5" (1.715 m)  Wt 190 lb (86.183 kg)  BMI 29.30 kg/m2  SpO2 92% Physical Exam  1145: Physical examination:  Nursing notes reviewed; Vital signs and O2 SAT reviewed;  Constitutional: Well developed, Well nourished, Well hydrated, In no acute distress; Head:  Normocephalic, atraumatic; Eyes: EOMI, PERRL, No scleral icterus; ENMT: Mouth and pharynx normal, Mucous membranes moist; Neck: Supple, Full range of motion, No lymphadenopathy; Cardiovascular: Irregular irregular rate and rhythm, No gallop; Respiratory: Breath sounds clear & equal bilaterally, No wheezes.  Speaking full sentences with ease, Normal respiratory effort/excursion; Chest: Nontender, Movement normal; Abdomen: Soft, +mild diffuse tenderness to palp. Nondistended, Normal bowel sounds. Rectal exam performed w/permission of pt and ED RN chaperone present.  Anal tone normal.  Non-tender, soft brown stool in rectal vault, heme positive.  No fissures, no external hemorrhoids, no palp masses.;; Genitourinary: No CVA tenderness; Extremities: Pulses normal, No tenderness, No edema, No calf edema or asymmetry.; Neuro: Awake, alert, confused per hx  dementia. No facial droop. Speech clear. Moves all extremities spontaneously..; Skin: Color pale, Warm, Dry.   ED Course  Procedures (including critical care time) Labs Review  Imaging Review  I have personally reviewed and evaluated these images and lab results as part of my medical decision-making.   EKG Interpretation   Date/Time:  Friday June 10 2016 11:57:32 EDT Ventricular Rate:  80 PR Interval:    QRS Duration: 144 QT Interval:  444 QTC Calculation: 513 R Axis:   -72 Text Interpretation:  Atrial fibrillation Left axis deviation Right bundle  branch block Inferior infarct, old When compared with ECG of 03/22/2016  Rate slower Confirmed by [facility]  MD, Nicholos Johns [phone]) on 06/10/2016  12:18:29 PM      MDM  MDM Reviewed: previous chart, nursing note and vitals Reviewed previous: labs and ECG Interpretation: labs, ECG,  x-ray and CT scan      Results for orders placed or performed during the hospital encounter of 06/10/16  Comprehensive metabolic panel  Result Value Ref Range   Sodium 133 (L) 135 - 145 mmol/L   Potassium 4.0 3.5 - 5.1 mmol/L   Chloride 103 101 - 111 mmol/L   CO2 23 22 - 32 mmol/L   Glucose, Bld 237 (H) 65 - 99 mg/dL   BUN 24 (H) 6 - 20 mg/dL   Creatinine, Ser 4.091.23 0.61 - 1.24 mg/dL   Calcium 7.5 (L) 8.9 - 10.3 mg/dL   Total Protein 5.4 (L) 6.5 - 8.1 g/dL   Albumin 2.2 (L) 3.5 - 5.0 g/dL   AST 30 15 - 41 U/L   ALT 8 (L) 17 - 63 U/L   Alkaline Phosphatase 77 38 - 126 U/L   Total Bilirubin 0.5 0.3 - 1.2 mg/dL   GFR calc non Af Amer 48 (L) >60 mL/min   GFR calc Af Amer 55 (L) >60 mL/min   Anion gap 7 5 - 15  Lipase, blood  Result Value Ref Range   Lipase 21 11 - 51 U/L  Troponin I  Result Value Ref Range   Troponin I 0.14 (HH) <0.03 ng/mL  Lactic acid, plasma  Result Value Ref Range   Lactic Acid, Venous 1.2 0.5 - 1.9 mmol/L  CBC with Differential  Result Value Ref Range   WBC 20.1 (H) 4.0 - 10.5 K/uL   RBC 2.97 (L) 4.22 - 5.81 MIL/uL    Hemoglobin 9.3 (L) 13.0 - 17.0 g/dL   HCT 81.129.2 (L) 91.439.0 - 78.252.0 %   MCV 98.3 78.0 - 100.0 fL   MCH 31.3 26.0 - 34.0 pg   MCHC 31.8 30.0 - 36.0 g/dL   RDW 95.615.7 (H) 21.311.5 - 08.615.5 %   Platelets 317 150 - 400 K/uL   Neutrophils Relative % 84 %   Neutro Abs 16.8 (H) 1.7 - 7.7 K/uL   Lymphocytes Relative 9 %   Lymphs Abs 1.7 0.7 - 4.0 K/uL   Monocytes Relative 8 %   Monocytes Absolute 1.5 (H) 0.1 - 1.0 K/uL   Eosinophils Relative 0 %   Eosinophils Absolute 0.1 0.0 - 0.7 K/uL   Basophils Relative 0 %   Basophils Absolute 0.0 0.0 - 0.1 K/uL  Urinalysis, Routine w reflex microscopic  Result Value Ref Range   Color, Urine YELLOW YELLOW   APPearance HAZY (A) CLEAR   Specific Gravity, Urine 1.025 1.005 - 1.030   pH 5.5 5.0 - 8.0   Glucose, UA NEGATIVE NEGATIVE mg/dL   Hgb urine dipstick LARGE (A) NEGATIVE   Bilirubin Urine NEGATIVE NEGATIVE   Ketones, ur TRACE (A) NEGATIVE mg/dL   Protein, ur 30 (A) NEGATIVE mg/dL   Nitrite NEGATIVE NEGATIVE   Leukocytes, UA MODERATE (A) NEGATIVE  Urine microscopic-add on  Result Value Ref Range   Squamous Epithelial / LPF NONE SEEN NONE SEEN   WBC, UA TOO NUMEROUS TO COUNT 0 - 5 WBC/hpf   RBC / HPF TOO NUMEROUS TO COUNT 0 - 5 RBC/hpf   Bacteria, UA MANY (A) NONE SEEN   Casts HYALINE CASTS (A) NEGATIVE   Urine-Other YEAST   POC occult blood, ED  Result Value Ref Range   Fecal Occult Bld POSITIVE (A) NEGATIVE  Sample to Blood Bank  Result Value Ref Range   Blood Bank Specimen SAMPLE AVAILABLE FOR TESTING    Sample Expiration 06/13/2016    Ct Abdomen Pelvis  Wo Contrast 06/10/2016  CLINICAL DATA:  Abdominal pain, vomiting starting yesterday EXAM: CT ABDOMEN AND PELVIS WITHOUT CONTRAST TECHNIQUE: Multidetector CT imaging of the abdomen and pelvis was performed following the standard protocol without IV contrast. COMPARISON:  05/20/2016 FINDINGS: Lower chest: There is small left pleural effusion. Small to moderate right pleural effusion. Bilateral lower  lobe posterior atelectasis. Mitral valve calcifications are noted. Hepatobiliary: Unenhanced liver shows no biliary ductal dilatation. Status postcholecystectomy. Pancreas: Unenhanced pancreas is atrophic Spleen: Unenhanced spleen is unremarkable. Adrenals/Urinary Tract: No adrenal gland mass. Unenhanced kidneys are symmetrical in size. Mild lobulated renal contour. A right ureteral stent in place. Tiny nonobstructive calculus midpole of the left kidney measures 2 mm. No hydronephrosis or hydroureter. The urinary bladder is empty decompressed. There is a Foley catheter within bladder. Stomach/Bowel: There is moderate gastric distension with gas and some fluid highly suspicious for gastric ileus. Moderate gaseous distension of the small bowel with few air-fluid levels. Some contrast material noted in mid small bowel loops. Findings suspicious for diffuse small bowel ileus, early small bowel obstruction or enteritis. Clinical correlation is necessary. No transition point in caliber of small bowel is noted. Abundant stool and some colonic gas noted within cecum. There is no pericecal inflammation. Normal appendix is partially visualized in axial image 54. No colonic diverticula are noted proximal sigmoid colon. No evidence of acute diverticulitis. Vascular/Lymphatic: Atherosclerotic calcifications of abdominal aorta and iliac arteries. No aortic aneurysm. No retroperitoneal or mesenteric adenopathy. Reproductive: No pelvic mass.  No adenopathy. Other: No abdominal ascites or free abdominal air. There is a tiny umbilical hernia containing fat without evidence of acute complication. Musculoskeletal: No destructive bony lesions are noted. Degenerative changes are noted thoracolumbar spine. There is posterior fusion at L4-L5 level with alignment preserved. Disc space flattening with vacuum disc phenomenon noted at L1-L2 and L2-L3 level. IMPRESSION: 1. Small left pleural effusion. Small to moderate right pleural effusion.  Bilateral lower lobe posterior atelectasis. 2. There is moderate gastric distension with gas and fluid. Highly suspicious for gastroparesis or gastric ileus. 3. Moderate gaseous distended small bowel loops with few air-fluid levels. Findings suspicious for ileus, early small bowel obstruction or diffuse enteritis. Clinical correlation is necessary. There is no transition point in caliber of small bowel. 4. Moderate stool and gas noted within cecum and right colon. No pericecal inflammation. Normal appendix is partially visualized. Some colonic gas noted within mid sigmoid colon. No distal colonic obstruction. Colonic diverticula are noted sigmoid colon without evidence of acute diverticulitis. 5. A right ureteral stent in place is noted. There is a Foley catheter within decompressed urinary bladder. 6. No evidence of free abdominal air. Electronically Signed   By: Natasha Mead M.D.   On: 06/10/2016 15:06    Dg Chest Port 1 View 06/10/2016  CLINICAL DATA:  Confusion with pain EXAM: PORTABLE CHEST 1 VIEW COMPARISON:  May 22, 2016 FINDINGS: There is generalized cardiomegaly with pulmonary vascularity within normal limits. There is slight bibasilar atelectasis. There is no edema or consolidation. No adenopathy. There is atherosclerotic calcification in the aorta. The patient is status post coronary artery bypass grafting. Central catheter tip is in the superior vena cava. No pneumothorax. There is degenerative change in each shoulder. IMPRESSION: Cardiomegaly. Mild bibasilar atelectasis. No airspace consolidation. There is aortic atherosclerosis. Central catheter tip in superior vena cava. Electronically Signed   By: Bretta Bang III M.D.   On: 06/10/2016 12:44    1530:  Troponin elevated, but trending downward from previous admission. +UTI,  UC pending; will dose IV rocephin. CT scan with ileus. Heme positive stool; no melena. No N/V or stooling while in the ED. Will admit. T/C to [facility]. Adrian Blackwater, case  discussed, including:  HPI, pertinent PM/SHx, VS/PE, dx testing, ED course and treatment:  Agreeable to admit, requests to write temporary orders, obtain tele bed to team [facility].    Samuel Jester, DO 06/12/16 [phone]

## 2016-06-10 NOTE — ED Notes (Signed)
Pt confused, moved pt to room 18 to be closer to nurses' station in case he tries to get out of bed.

## 2016-06-10 NOTE — ED Notes (Signed)
MD at the bedside to speak with daughter

## 2016-06-10 NOTE — ED Notes (Signed)
Pt having increased agitation, family at the bedside

## 2016-06-10 NOTE — ED Notes (Signed)
Pt drinking contrast. 

## 2016-06-10 NOTE — ED Notes (Signed)
Okay with MD to use PICC line, flushed and blood flow

## 2016-06-10 NOTE — ED Notes (Signed)
CRITICAL VALUE ALERT  Critical value received:  Troponin 0.14  Date of notification:  06/10/16  Time of notification:  0127  Critical value read back:Yes.    Nurse who received alert: Rudene AndaSusan Frye-Yarbrough, RN  MD notified (1st page): Dr. Clarene DukeMcManus  Time of first page: 0127  Time MD responded:  0127

## 2016-06-10 NOTE — H&P (Signed)
History and Physical  [name] WUJ:[id number] DOB: Aug 08, 1918 DOA: 06/10/2016  Referring physician: Dr Clarene Duke, ED physician PCP: Dwana Melena, MD    Chief Complaint: Vomiting  HPI: [name] is a 82 y.o. male with a history of coronary artery disease status post CABG, type 2 diabetes, atrial fibrillation (Chads 2 vasc score 4), chronic kidney disease, GERD, recent hospitalization for sepsis secondary to UTI from 6/23 until 6/27. Patient is a resident of [facility]. He has been having nausea and vomiting over the past 24 hours which has been worsening. No palliating or provoking factors. Patient was brought here by EMS. He has been on antibiotics since his discharge, although his urinalysis shows evidence of a urinary tract infection. He does have a 7 mm nephrolithiasis and was supposed to follow-up with urology, however he has not been able to. Additionally, the patient has been more confused and agitated that has been worsening since being in the emergency department.   Review of Systems:   Patient unable to provide review of systems secondary to agitation and encephalopathy  Past Medical History  Diagnosis Date  . Coronary atherosclerosis of native coronary artery     Multivessel status post CABG  . Mixed hyperlipidemia   . Type 2 diabetes mellitus (HCC)   . Atrial fibrillation (HCC)   . Essential hypertension, benign   . Sleep apnea   . Hydroureteronephrosis 03/22/2016  . CKD (chronic kidney disease)   . Severe sepsis with septic shock (CODE) (HCC)   . UTI (lower urinary tract infection)   . Respiratory failure (HCC)   . Thrombocytopenia (HCC)   . Syncope and collapse    Past Surgical History  Procedure Laterality Date  . Tonsillectomy    . Cholecystectomy    . Knee arthroscopy    . Hernia repair    . Coronary artery bypass graft  1992    LIMA to LAD and diagonal, SVG to RCA, SVG to OM  . Back surgery    . Esophagogastroduodenoscopy (egd) with esophageal  dilation N/A 09/12/2013    Procedure: ESOPHAGOGASTRODUODENOSCOPY (EGD) WITH ESOPHAGEAL DILATION;  Surgeon: Malissa Hippo, MD;  Location: [facility];  Service: Endoscopy;  Laterality: N/A;  . Cystoscopy w/ ureteral stent placement Right 03/22/2016    Procedure: CYSTOSCOPY WITH RIGHT RETROGRADE PYELOGRAM, RIGHT URETERAL STENT PLACEMENT;  Surgeon: Marcine Matar, MD;  Location: [facility];  Service: Urology;  Laterality: Right;   Social History:  reports that he quit smoking about 52 years ago. He started smoking about 71 years ago. He has never used smokeless tobacco. He reports that he does not drink alcohol or use illicit drugs. Patient lives at [facility]  Allergies  Allergen Reactions  . Acetaminophen-Codeine     Unknown reaction-not listed on current MAR  . Advil [Ibuprofen] Other (See Comments)    Unknown reaction  . Penicillins     Unknown reaction-not listed on current MAR    Family History  Problem Relation Age of Onset  . Diabetes        Prior to Admission medications   Medication Sig Start Date End Date Taking? Authorizing Provider  aspirin EC 81 MG tablet Take 81 mg by mouth every evening.    Yes Historical Provider, MD  atorvastatin (LIPITOR) 40 MG tablet Take 40 mg by mouth daily.   Yes Historical Provider, MD  beta carotene w/minerals (OCUVITE) tablet Take 1 tablet by mouth every evening.    Yes Historical Provider, MD  ceFAZolin (  ANCEF) 2-4 GM/100ML-% IVPB Inject 100 mLs (2 g total) into the vein every 12 (twelve) hours. Give the last dose on 06/17/16. 05/24/16  Yes Elliot Cousinenise Fisher, MD  cyclobenzaprine (FLEXERIL) 5 MG tablet Take 5 mg by mouth 2 (two) times daily.   Yes Historical Provider, MD  diclofenac sodium (VOLTAREN) 1 % GEL Apply 2 g topically 2 (two) times daily. Applied to proximal right leg front and back (quadriceps and hamstrings). 05/24/16  Yes Elliot Cousinenise Fisher, MD  dutasteride (AVODART) 0.5 MG capsule Take 0.5 mg by mouth every evening.    Yes Historical  Provider, MD  gabapentin (NEURONTIN) 100 MG capsule Take 100 mg by mouth daily.  09/30/14  Yes Historical Provider, MD  HYDROcodone-acetaminophen (NORCO/VICODIN) 5-325 MG tablet Take 1 tablet by mouth every 4 (four) hours as needed for moderate pain. 05/24/16  Yes Elliot Cousinenise Fisher, MD  insulin aspart (NOVOLOG) 100 UNIT/ML injection Sliding scale NovoLog 3 times daily: CBG 70-120 no units. CBG 121-150 give 2 units. CBG 151-200 give 3 units. CBG 201-250 give 5 units. CBG 251-300 give 8 units. CBG 301-350 give 11 units. CBG  351-400 give 15 units. Patient taking differently: Inject 0-15 Units into the skin 3 (three) times daily with meals. Sliding scale NovoLog 3 times daily: CBG 70-120 no units. CBG 121-150 give 2 units. CBG 151-200 give 3 units. CBG 201-250 give 5 units. CBG 251-300 give 8 units. CBG 301-350 give 11 units. CBG  351-400 give 15 units. 05/24/16  Yes Elliot Cousinenise Fisher, MD  mirabegron ER (MYRBETRIQ) 50 MG TB24 tablet Take 50 mg by mouth daily.   Yes Historical Provider, MD  Multiple Vitamin (DAILY VITE PO) Take 1 tablet by mouth daily.   Yes Historical Provider, MD  ondansetron (ZOFRAN) 4 MG tablet Take 4 mg by mouth every 8 (eight) hours as needed for nausea or vomiting.   Yes Historical Provider, MD  oxyCODONE-acetaminophen (PERCOCET/ROXICET) 5-325 MG tablet Take 1 tablet by mouth every 6 (six) hours as needed for moderate pain or severe pain.   Yes Historical Provider, MD  pindolol (VISKEN) 5 MG tablet Take 5 mg by mouth daily.  04/22/14  Yes Historical Provider, MD  sitaGLIPtin (JANUVIA) 100 MG tablet Take 100 mg by mouth every evening.    Yes Historical Provider, MD    Physical Exam: BP 131/72 mmHg  Pulse 94  Temp(Src) 98.2 F (36.8 C) (Oral)  Resp 26  Ht 5' 7.5" (1.715 m)  Wt 86.183 kg (190 lb)  BMI 29.30 kg/m2  SpO2 91%  General: Elderly Caucasian male. Awake and alert. Patient oriented to self. No acute cardiopulmonary distress.  HEENT: Normocephalic atraumatic.  Right and left ears  normal in appearance.  Pupils equal, round, reactive to light. Extraocular muscles are intact. Sclerae anicteric and noninjected.  Moist mucosal membranes. No mucosal lesions.  Neck: Neck supple without lymphadenopathy. No carotid bruits. No masses palpated.  Cardiovascular: Regular rate with normal S1-S2 sounds. No murmurs, rubs, gallops auscultated. No JVD.  Respiratory: Good respiratory effort with no wheezes, rales, rhonchi. Lungs clear to auscultation bilaterally.  No accessory muscle use. Abdomen: Soft, nontender, nondistended. Active bowel sounds. No masses or hepatosplenomegaly  Skin: No rashes, lesions, or ulcerations.  Dry, warm to touch. 2+ dorsalis pedis and radial pulses. Musculoskeletal: No calf or leg pain. All major joints not erythematous nontender.  No upper or lower joint deformation.  Good ROM.  No contractures  Psychiatric: Unable to determine. Neurologic: No focal neurological deficits. Strength is 5/5 and symmetric in upper  and lower extremities.  Cranial nerves II through XII are grossly intact.           Labs on Admission: I have personally reviewed following labs and imaging studies  CBC:  Recent Labs Lab 06/10/16 1159  WBC 20.1*  NEUTROABS 16.8*  HGB 9.3*  HCT 29.2*  MCV 98.3  PLT 317   Basic Metabolic Panel:  Recent Labs Lab 06/10/16 1159  NA 133*  K 4.0  CL 103  CO2 23  GLUCOSE 237*  BUN 24*  CREATININE 1.23  CALCIUM 7.5*   GFR: Estimated Creatinine Clearance: 37.2 mL/min (by C-G formula based on Cr of 1.23). Liver Function Tests:  Recent Labs Lab 06/10/16 1159  AST 30  ALT 8*  ALKPHOS 77  BILITOT 0.5  PROT 5.4*  ALBUMIN 2.2*    Recent Labs Lab 06/10/16 1159  LIPASE 21   No results for input(s): AMMONIA in the last 168 hours. Coagulation Profile: No results for input(s): INR, PROTIME in the last 168 hours. Cardiac Enzymes:  Recent Labs Lab 06/10/16 1159  TROPONINI 0.14*   BNP (last 3 results) No results for input(s):  PROBNP in the last 8760 hours. HbA1C: No results for input(s): HGBA1C in the last 72 hours. CBG: No results for input(s): GLUCAP in the last 168 hours. Lipid Profile: No results for input(s): CHOL, HDL, LDLCALC, TRIG, CHOLHDL, LDLDIRECT in the last 72 hours. Thyroid Function Tests: No results for input(s): TSH, T4TOTAL, FREET4, T3FREE, THYROIDAB in the last 72 hours. Anemia Panel: No results for input(s): VITAMINB12, FOLATE, FERRITIN, TIBC, IRON, RETICCTPCT in the last 72 hours. Urine analysis:    Component Value Date/Time   COLORURINE YELLOW 06/10/2016 1211   APPEARANCEUR HAZY* 06/10/2016 1211   LABSPEC 1.025 06/10/2016 1211   PHURINE 5.5 06/10/2016 1211   GLUCOSEU NEGATIVE 06/10/2016 1211   HGBUR LARGE* 06/10/2016 1211   BILIRUBINUR NEGATIVE 06/10/2016 1211   KETONESUR TRACE* 06/10/2016 1211   PROTEINUR 30* 06/10/2016 1211   UROBILINOGEN 0.2 01/29/2012 2204   NITRITE NEGATIVE 06/10/2016 1211   LEUKOCYTESUR MODERATE* 06/10/2016 1211   Sepsis Labs: (procalcitonin:4,lacticidven:4) )No results found for this or any previous visit (from the past 240 hour(s)).   Radiological Exams on Admission: Ct Abdomen Pelvis Wo Contrast  06/10/2016  CLINICAL DATA:  Abdominal pain, vomiting starting yesterday EXAM: CT ABDOMEN AND PELVIS WITHOUT CONTRAST TECHNIQUE: Multidetector CT imaging of the abdomen and pelvis was performed following the standard protocol without IV contrast. COMPARISON:  05/20/2016 FINDINGS: Lower chest: There is small left pleural effusion. Small to moderate right pleural effusion. Bilateral lower lobe posterior atelectasis. Mitral valve calcifications are noted. Hepatobiliary: Unenhanced liver shows no biliary ductal dilatation. Status postcholecystectomy. Pancreas: Unenhanced pancreas is atrophic Spleen: Unenhanced spleen is unremarkable. Adrenals/Urinary Tract: No adrenal gland mass. Unenhanced kidneys are symmetrical in size. Mild lobulated renal contour. A right  ureteral stent in place. Tiny nonobstructive calculus midpole of the left kidney measures 2 mm. No hydronephrosis or hydroureter. The urinary bladder is empty decompressed. There is a Foley catheter within bladder. Stomach/Bowel: There is moderate gastric distension with gas and some fluid highly suspicious for gastric ileus. Moderate gaseous distension of the small bowel with few air-fluid levels. Some contrast material noted in mid small bowel loops. Findings suspicious for diffuse small bowel ileus, early small bowel obstruction or enteritis. Clinical correlation is necessary. No transition point in caliber of small bowel is noted. Abundant stool and some colonic gas noted within cecum. There is no pericecal inflammation. Normal appendix is  partially visualized in axial image 54. No colonic diverticula are noted proximal sigmoid colon. No evidence of acute diverticulitis. Vascular/Lymphatic: Atherosclerotic calcifications of abdominal aorta and iliac arteries. No aortic aneurysm. No retroperitoneal or mesenteric adenopathy. Reproductive: No pelvic mass.  No adenopathy. Other: No abdominal ascites or free abdominal air. There is a tiny umbilical hernia containing fat without evidence of acute complication. Musculoskeletal: No destructive bony lesions are noted. Degenerative changes are noted thoracolumbar spine. There is posterior fusion at L4-L5 level with alignment preserved. Disc space flattening with vacuum disc phenomenon noted at L1-L2 and L2-L3 level. IMPRESSION: 1. Small left pleural effusion. Small to moderate right pleural effusion. Bilateral lower lobe posterior atelectasis. 2. There is moderate gastric distension with gas and fluid. Highly suspicious for gastroparesis or gastric ileus. 3. Moderate gaseous distended small bowel loops with few air-fluid levels. Findings suspicious for ileus, early small bowel obstruction or diffuse enteritis. Clinical correlation is necessary. There is no transition  point in caliber of small bowel. 4. Moderate stool and gas noted within cecum and right colon. No pericecal inflammation. Normal appendix is partially visualized. Some colonic gas noted within mid sigmoid colon. No distal colonic obstruction. Colonic diverticula are noted sigmoid colon without evidence of acute diverticulitis. 5. A right ureteral stent in place is noted. There is a Foley catheter within decompressed urinary bladder. 6. No evidence of free abdominal air. Electronically Signed   By: Natasha Mead M.D.   On: 06/10/2016 15:06   Dg Chest Port 1 View  06/10/2016  CLINICAL DATA:  Confusion with pain EXAM: PORTABLE CHEST 1 VIEW COMPARISON:  May 22, 2016 FINDINGS: There is generalized cardiomegaly with pulmonary vascularity within normal limits. There is slight bibasilar atelectasis. There is no edema or consolidation. No adenopathy. There is atherosclerotic calcification in the aorta. The patient is status post coronary artery bypass grafting. Central catheter tip is in the superior vena cava. No pneumothorax. There is degenerative change in each shoulder. IMPRESSION: Cardiomegaly. Mild bibasilar atelectasis. No airspace consolidation. There is aortic atherosclerosis. Central catheter tip in superior vena cava. Electronically Signed   By: Bretta Bang III M.D.   On: 06/10/2016 12:44    EKG: Independently reviewed. Atrial fibrillation with left axis deviation. Old inferior infarct. Right bundle branch block. No acute ST changes.  Assessment/Plan: Principal Problem:   Encephalopathy acute Active Problems:   Diabetes mellitus type 2 with complications (HCC)   Chronic atrial fibrillation (HCC)   Ileus (HCC)   Elevated troponin I level   UTI (lower urinary tract infection)    This patient was discussed with the ED physician, including pertinent vitals, physical exam findings, labs, and imaging.  We also discussed care given by the ED provider.  #1 acute encephalopathy  Admits on  telemetry  We'll treat primary problem as this is likely secondary to UTI #2 UTI  Ceftriaxone  Consult urology due to indwelling stent and nephrolithiasis with secondary infection #3 ileus  The patient's nausea and vomiting is likely due to the ileus. Although an NG tube may help with a gastric ileus, the patient's agitation and encephalopathy will prevent NG tube placement at this time, as it would require pretty significant sedation to keep the patient from removing the NG tube  We'll keep the patient on Protonix  Patient nothing by mouth #4 elevated troponin I level  Telemetry monitoring  Serial enzymes #5 chronic atrial fibrillation  Early stable #6 diabetes type 2  Signed scale insulin  Hold Januvia as pt NPO  DVT prophylaxis: Patient not on anticoagulation secondary to fall risk. Will give Lovenox Consultants: Consult urology Code Status: DO NOT RESUSCITATE Family Communication: Daughter in the room, who is power of attorney  Disposition Plan: Return to [facility] following admission   Levie Heritage, DO [facility] Pager [phone]  If 7PM-7AM, please contact night-coverage [email or web address] Password [email or web address]

## 2016-06-10 NOTE — ED Notes (Signed)
[facility] staff reports pt started vomiting yesterday and c/o abd pain.  Reports this morning emesis was brown.  EMS says pt projectile vomited enroute to hospital.  Staff gave pt zofran this morning per ems.

## 2016-06-10 NOTE — ED Notes (Signed)
Hospitalist at the bedside 

## 2016-06-11 DIAGNOSIS — R7989 Other specified abnormal findings of blood chemistry: Secondary | ICD-10-CM | POA: Diagnosis not present

## 2016-06-11 DIAGNOSIS — I130 Hypertensive heart and chronic kidney disease with heart failure and stage 1 through stage 4 chronic kidney disease, or unspecified chronic kidney disease: Secondary | ICD-10-CM | POA: Diagnosis present

## 2016-06-11 DIAGNOSIS — Z7189 Other specified counseling: Secondary | ICD-10-CM | POA: Diagnosis not present

## 2016-06-11 DIAGNOSIS — Z833 Family history of diabetes mellitus: Secondary | ICD-10-CM | POA: Diagnosis not present

## 2016-06-11 DIAGNOSIS — N200 Calculus of kidney: Secondary | ICD-10-CM | POA: Diagnosis present

## 2016-06-11 DIAGNOSIS — G9340 Encephalopathy, unspecified: Secondary | ICD-10-CM | POA: Diagnosis not present

## 2016-06-11 DIAGNOSIS — K567 Ileus, unspecified: Secondary | ICD-10-CM

## 2016-06-11 DIAGNOSIS — Z515 Encounter for palliative care: Secondary | ICD-10-CM | POA: Diagnosis not present

## 2016-06-11 DIAGNOSIS — N189 Chronic kidney disease, unspecified: Secondary | ICD-10-CM | POA: Diagnosis present

## 2016-06-11 DIAGNOSIS — Z794 Long term (current) use of insulin: Secondary | ICD-10-CM | POA: Diagnosis not present

## 2016-06-11 DIAGNOSIS — D509 Iron deficiency anemia, unspecified: Secondary | ICD-10-CM | POA: Diagnosis present

## 2016-06-11 DIAGNOSIS — F0390 Unspecified dementia without behavioral disturbance: Secondary | ICD-10-CM | POA: Diagnosis present

## 2016-06-11 DIAGNOSIS — N390 Urinary tract infection, site not specified: Secondary | ICD-10-CM | POA: Diagnosis present

## 2016-06-11 DIAGNOSIS — I5033 Acute on chronic diastolic (congestive) heart failure: Secondary | ICD-10-CM | POA: Diagnosis not present

## 2016-06-11 DIAGNOSIS — E1122 Type 2 diabetes mellitus with diabetic chronic kidney disease: Secondary | ICD-10-CM | POA: Diagnosis present

## 2016-06-11 DIAGNOSIS — E782 Mixed hyperlipidemia: Secondary | ICD-10-CM | POA: Diagnosis present

## 2016-06-11 DIAGNOSIS — K219 Gastro-esophageal reflux disease without esophagitis: Secondary | ICD-10-CM | POA: Diagnosis present

## 2016-06-11 DIAGNOSIS — Z951 Presence of aortocoronary bypass graft: Secondary | ICD-10-CM | POA: Diagnosis not present

## 2016-06-11 DIAGNOSIS — I248 Other forms of acute ischemic heart disease: Secondary | ICD-10-CM | POA: Diagnosis present

## 2016-06-11 DIAGNOSIS — I2510 Atherosclerotic heart disease of native coronary artery without angina pectoris: Secondary | ICD-10-CM | POA: Diagnosis present

## 2016-06-11 DIAGNOSIS — Z87891 Personal history of nicotine dependence: Secondary | ICD-10-CM | POA: Diagnosis not present

## 2016-06-11 DIAGNOSIS — Z66 Do not resuscitate: Secondary | ICD-10-CM | POA: Diagnosis present

## 2016-06-11 DIAGNOSIS — R1110 Vomiting, unspecified: Secondary | ICD-10-CM | POA: Diagnosis present

## 2016-06-11 DIAGNOSIS — Z7982 Long term (current) use of aspirin: Secondary | ICD-10-CM | POA: Diagnosis not present

## 2016-06-11 DIAGNOSIS — G4730 Sleep apnea, unspecified: Secondary | ICD-10-CM | POA: Diagnosis present

## 2016-06-11 DIAGNOSIS — B49 Unspecified mycosis: Secondary | ICD-10-CM | POA: Diagnosis not present

## 2016-06-11 DIAGNOSIS — I482 Chronic atrial fibrillation: Secondary | ICD-10-CM | POA: Diagnosis present

## 2016-06-11 DIAGNOSIS — E118 Type 2 diabetes mellitus with unspecified complications: Secondary | ICD-10-CM | POA: Diagnosis not present

## 2016-06-11 DIAGNOSIS — D649 Anemia, unspecified: Secondary | ICD-10-CM | POA: Diagnosis not present

## 2016-06-11 LAB — TROPONIN I
Troponin I: 0.15 ng/mL (ref <0.03)
Troponin I: 0.15 ng/mL (ref <0.03)

## 2016-06-11 LAB — GLUCOSE, CAPILLARY
GLUCOSE-CAPILLARY: 98 mg/dL (ref 65–99)
GLUCOSE-CAPILLARY: 117 mg/dL — AB (ref 65–99)
Glucose-Capillary: 127 mg/dL — ABNORMAL HIGH (ref 65–99)
Glucose-Capillary: 110 mg/dL — ABNORMAL HIGH (ref 65–99)
Glucose-Capillary: 117 mg/dL — ABNORMAL HIGH (ref 65–99)

## 2016-06-11 LAB — BASIC METABOLIC PANEL
ANION GAP: 8 (ref 5–15)
BUN: 30 mg/dL — ABNORMAL HIGH (ref 6–20)
CHLORIDE: 107 mmol/L (ref 101–111)
CO2: 22 mmol/L (ref 22–32)
CREATININE: 1.39 mg/dL — AB (ref 0.61–1.24)
Calcium: 7.4 mg/dL — ABNORMAL LOW (ref 8.9–10.3)
GFR calc non Af Amer: 41 mL/min — ABNORMAL LOW (ref >60)
GFR, EST AFRICAN AMERICAN: 48 mL/min — AB (ref >60)
Glucose, Bld: 96 mg/dL (ref 65–99)
POTASSIUM: 3.8 mmol/L (ref 3.5–5.1)
SODIUM: 137 mmol/L (ref 135–145)

## 2016-06-11 LAB — CBC
HEMATOCRIT: 23.9 % — AB (ref 39.0–52.0)
HEMOGLOBIN: 7.9 g/dL — AB (ref 13.0–17.0)
MCH: 32.5 pg (ref 26.0–34.0)
MCHC: 33.1 g/dL (ref 30.0–36.0)
MCV: 98.4 fL (ref 78.0–100.0)
PLATELETS: 253 10*3/uL (ref 150–400)
RBC: 2.43 MIL/uL — AB (ref 4.22–5.81)
RDW: 15.6 % — ABNORMAL HIGH (ref 11.5–15.5)
WBC: 20 10*3/uL — AB (ref 4.0–10.5)

## 2016-06-11 LAB — MRSA PCR SCREENING: MRSA by PCR: NEGATIVE

## 2016-06-11 MED ORDER — DEXTROSE 5 % IV SOLN
1.0000 g | INTRAVENOUS | Status: DC
  Administered 2016-06-11: 1 g via INTRAVENOUS
  Filled 2016-06-11 (×2): qty 10

## 2016-06-11 MED ORDER — CEFAZOLIN IN D5W 1 GM/50ML IV SOLN
1.0000 g | Freq: Three times a day (TID) | INTRAVENOUS | Status: DC
  Administered 2016-06-11 – 2016-06-14 (×10): 1 g via INTRAVENOUS
  Filled 2016-06-11 (×13): qty 50

## 2016-06-11 MED ORDER — MILK AND MOLASSES ENEMA
1.0000 | Freq: Once | RECTAL | Status: AC
  Administered 2016-06-11: 250 mL via RECTAL

## 2016-06-11 NOTE — Care Management Obs Status (Signed)
MEDICARE OBSERVATION STATUS NOTIFICATION   Patient Details  Name: Stormy CardGeorge W Hoare MRN: [id number] Date of Birth: [birth year]   Medicare Observation Status Notification Given:  Yes    Fuller PlanWelborn, Asna Muldrow M, RN 06/11/2016, 11:58 AM

## 2016-06-11 NOTE — Progress Notes (Signed)
CRITICAL VALUE ALERT  Critical value received:  Troponin 0.15   Date of notification:  06/11/2016  Time of notification:  0135  Critical value read back:Yes.    Nurse who received alert:  Katrinka BlazingK. Garcia, RN  MD notified (1st page):  R. Onalee Huaavid, MD  Time of first page:  0138  Responding MD:  Vania Rea. David, MD  Time MD responded:  [id number]

## 2016-06-11 NOTE — Progress Notes (Signed)
PROGRESS NOTE    [name]  ZOX:[id number] DOB: March 23, 1918 DOA: 06/10/2016 PCP: Dwana Melena, MD  Outpatient Specialists:   Brief Narrative:  84 yom with a hx of CAD s/p CABG, DM type 2, and afib presented with complaints of emesis. While in the ED, he was found to have he was noted to have a UTI, and also became more confused and agitated. He was subsequently admitted for further management of acute encephalopathy secondary to UTI.    Assessment & Plan:   Principal Problem:   Encephalopathy acute Active Problems:   Diabetes mellitus type 2 with complications (HCC)   Chronic atrial fibrillation (HCC)   Ileus (HCC)   Elevated troponin I level   UTI (lower urinary tract infection)  1. Acute encephalopathy. Unclear etiology, possibly secondary to UTI. Anticipate improvement with treatment of UTI. 2. UTI. Elevated WBC. Patient is currently on Rocephin, follow up urine Cx. On CT scan he has evidence of indwelling uretral stent and non-obstructing uretral calculi. No evidence of stranding or uretral obstruction. 3. Ileus. Nausea and vomiting likely due to ileus. NG tube placement prevented by agitation as it would likely require heavy sedation. Continue protonix and keep NPO. Will provide bowel regimen with enema/suppository.  4. Leukocytosis. Etiology possibly related to UTI. Will check BC and if positive, PICC line will likely need to be removed. Patient was receiving Ancef prior to admission. Indication is unclear at this time.  5. Recent MSSA bacteremia. Patient was discharge on 6/27 after being evaluated for MSSA bacteremia. He was discharged on a course of Ancef to be completed on 7/21. 6. Elevated troponin I level. Likely demand ischemia. Continue to monitor enzymes. 7. Chronic atrial fib. Stable. CHADSVASC 4. Patient not chronically anticoagulated due to fall risk. 8. Chronic diastolic CHF. Past EF is 55-60%. Patient is already on a beta blocker. Will d/c IVF's as to not  precipitate decompensated CHF. 9. DM type 2. Continue SSI.    DVT prophylaxis: Lovenox Code Status: DNR Family Communication: no family present at bedside Disposition Plan: discharge back to [facility] once improved.   Consultants:   none  Procedures:   none  Antimicrobials:   Rocephin  7/14   Subjective: Patient rouses to touch but does not respond coherently to verbal command.  Objective: Filed Vitals:   06/10/16 1621 06/10/16 1630 06/10/16 1743 06/10/16 2101  BP: 131/72 123/98 126/61 113/97  Pulse: 94  126 81  Temp:   98.4 F (36.9 C) 97.8 F (36.6 C)  TempSrc:   Axillary Axillary  Resp: 26 20 20 20   Height:      Weight:      SpO2: 91%  95% 100%    Intake/Output Summary (Last 24 hours) at 06/11/16 0606 Last data filed at 06/11/16 0300  Gross per 24 hour  Intake    680 ml  Output    450 ml  Net    230 ml   Filed Weights   06/10/16 1115  Weight: 86.183 kg (190 lb)    Examination: General exam: Appears calm but somnolent. Respiratory system: Crackles at bases.  Cardiovascular system: S1 & S2 heard, RRR. No JVD, murmurs, rubs, gallops or clicks. No pedal edema. Gastrointestinal system: Abdomen is nondistended, soft. Mild tenderness throughout abdomen. No organomegaly or masses felt. Normal bowel sounds heard. Central nervous system: Patient is lethargic. Extremities: Symmetric  Skin: No rashes, lesions or ulcers Psychiatry: Cannot assess due to mental status.    Data Reviewed: I have personally reviewed  following labs and imaging studies  CBC:  Recent Labs Lab 06/10/16 1159  WBC 20.1*  NEUTROABS 16.8*  HGB 9.3*  HCT 29.2*  MCV 98.3  PLT 317   Basic Metabolic Panel:  Recent Labs Lab 06/10/16 1159  NA 133*  K 4.0  CL 103  CO2 23  GLUCOSE 237*  BUN 24*  CREATININE 1.23  CALCIUM 7.5*   GFR: Estimated Creatinine Clearance: 37.2 mL/min (by C-G formula based on Cr of 1.23). Liver Function Tests:  Recent Labs Lab  06/10/16 1159  AST 30  ALT 8*  ALKPHOS 77  BILITOT 0.5  PROT 5.4*  ALBUMIN 2.2*    Recent Labs Lab 06/10/16 1159  LIPASE 21   Cardiac Enzymes:  Recent Labs Lab 06/10/16 1159 06/10/16 1849 06/11/16 0030  TROPONINI 0.14* 0.14* 0.15*   CBG:  Recent Labs Lab 06/10/16 1729 06/10/16 2027 06/10/16 2344 06/11/16 0339  GLUCAP 225* 187* 177* 127*   Urine analysis:    Component Value Date/Time   COLORURINE YELLOW 06/10/2016 1211   APPEARANCEUR HAZY* 06/10/2016 1211   LABSPEC 1.025 06/10/2016 1211   PHURINE 5.5 06/10/2016 1211   GLUCOSEU NEGATIVE 06/10/2016 1211   HGBUR LARGE* 06/10/2016 1211   BILIRUBINUR NEGATIVE 06/10/2016 1211   KETONESUR TRACE* 06/10/2016 1211   PROTEINUR 30* 06/10/2016 1211   UROBILINOGEN 0.2 01/29/2012 2204   NITRITE NEGATIVE 06/10/2016 1211   LEUKOCYTESUR MODERATE* 06/10/2016 1211   Sepsis Labs: @LABRCNTIP (procalcitonin:4,lacticidven:4)  ) Recent Results (from the past 240 hour(s))  MRSA PCR Screening     Status: None   Collection Time: 06/10/16  6:42 PM  Result Value Ref Range Status   MRSA by PCR NEGATIVE NEGATIVE Final    Comment:        The GeneXpert MRSA Assay (FDA approved for NASAL specimens only), is one component of a comprehensive MRSA colonization surveillance program. It is not intended to diagnose MRSA infection nor to guide or monitor treatment for MRSA infections.          Radiology Studies: Ct Abdomen Pelvis Wo Contrast  06/10/2016  CLINICAL DATA:  Abdominal pain, vomiting starting yesterday EXAM: CT ABDOMEN AND PELVIS WITHOUT CONTRAST TECHNIQUE: Multidetector CT imaging of the abdomen and pelvis was performed following the standard protocol without IV contrast. COMPARISON:  05/20/2016 FINDINGS: Lower chest: There is small left pleural effusion. Small to moderate right pleural effusion. Bilateral lower lobe posterior atelectasis. Mitral valve calcifications are noted. Hepatobiliary: Unenhanced liver shows no  biliary ductal dilatation. Status postcholecystectomy. Pancreas: Unenhanced pancreas is atrophic Spleen: Unenhanced spleen is unremarkable. Adrenals/Urinary Tract: No adrenal gland mass. Unenhanced kidneys are symmetrical in size. Mild lobulated renal contour. A right ureteral stent in place. Tiny nonobstructive calculus midpole of the left kidney measures 2 mm. No hydronephrosis or hydroureter. The urinary bladder is empty decompressed. There is a Foley catheter within bladder. Stomach/Bowel: There is moderate gastric distension with gas and some fluid highly suspicious for gastric ileus. Moderate gaseous distension of the small bowel with few air-fluid levels. Some contrast material noted in mid small bowel loops. Findings suspicious for diffuse small bowel ileus, early small bowel obstruction or enteritis. Clinical correlation is necessary. No transition point in caliber of small bowel is noted. Abundant stool and some colonic gas noted within cecum. There is no pericecal inflammation. Normal appendix is partially visualized in axial image 54. No colonic diverticula are noted proximal sigmoid colon. No evidence of acute diverticulitis. Vascular/Lymphatic: Atherosclerotic calcifications of abdominal aorta and iliac arteries. No aortic aneurysm. No  retroperitoneal or mesenteric adenopathy. Reproductive: No pelvic mass.  No adenopathy. Other: No abdominal ascites or free abdominal air. There is a tiny umbilical hernia containing fat without evidence of acute complication. Musculoskeletal: No destructive bony lesions are noted. Degenerative changes are noted thoracolumbar spine. There is posterior fusion at L4-L5 level with alignment preserved. Disc space flattening with vacuum disc phenomenon noted at L1-L2 and L2-L3 level. IMPRESSION: 1. Small left pleural effusion. Small to moderate right pleural effusion. Bilateral lower lobe posterior atelectasis. 2. There is moderate gastric distension with gas and fluid.  Highly suspicious for gastroparesis or gastric ileus. 3. Moderate gaseous distended small bowel loops with few air-fluid levels. Findings suspicious for ileus, early small bowel obstruction or diffuse enteritis. Clinical correlation is necessary. There is no transition point in caliber of small bowel. 4. Moderate stool and gas noted within cecum and right colon. No pericecal inflammation. Normal appendix is partially visualized. Some colonic gas noted within mid sigmoid colon. No distal colonic obstruction. Colonic diverticula are noted sigmoid colon without evidence of acute diverticulitis. 5. A right ureteral stent in place is noted. There is a Foley catheter within decompressed urinary bladder. 6. No evidence of free abdominal air. Electronically Signed   By: Natasha Mead M.D.   On: 06/10/2016 15:06   Dg Chest Port 1 View  06/10/2016  CLINICAL DATA:  Confusion with pain EXAM: PORTABLE CHEST 1 VIEW COMPARISON:  May 22, 2016 FINDINGS: There is generalized cardiomegaly with pulmonary vascularity within normal limits. There is slight bibasilar atelectasis. There is no edema or consolidation. No adenopathy. There is atherosclerotic calcification in the aorta. The patient is status post coronary artery bypass grafting. Central catheter tip is in the superior vena cava. No pneumothorax. There is degenerative change in each shoulder. IMPRESSION: Cardiomegaly. Mild bibasilar atelectasis. No airspace consolidation. There is aortic atherosclerosis. Central catheter tip in superior vena cava. Electronically Signed   By: Bretta Bang III M.D.   On: 06/10/2016 12:44   Scheduled Meds: . aspirin EC  81 mg Oral QPM  . atorvastatin  40 mg Oral q1800  . dutasteride  0.5 mg Oral QPM  . enoxaparin (LOVENOX) injection  40 mg Subcutaneous Q24H  . gabapentin  100 mg Oral Daily  . insulin aspart  0-15 Units Subcutaneous Q4H  . mirabegron ER  50 mg Oral Daily  . pindolol  5 mg Oral Daily  . sodium chloride flush  3 mL  Intravenous Q12H   Continuous Infusions: . sodium chloride 75 mL/hr (06/10/16 1200)   Time spent: 25 minutes  Erick Blinks, MD [facility] Pager [phone]  If 7PM-7AM, please contact night-coverage [email or web address] Password [email or web address] 06/11/2016, 6:06 AM    By signing my name below, I, Adron Bene, attest that this documentation has been prepared under the direction and in the presence of Erick Blinks, MD. Electronically Signed: Adron Bene 06/11/2016 2:10pm  I, Dr. Erick Blinks, personally performed the services described in this documentaiton. All medical record entries made by the scribe were at my direction and in my presence. I have reviewed the chart and agree that the record reflects my personal performance and is accurate and complete  Erick Blinks, MD, 06/11/2016 2:31 PM

## 2016-06-12 DIAGNOSIS — D649 Anemia, unspecified: Secondary | ICD-10-CM

## 2016-06-12 LAB — BASIC METABOLIC PANEL
ANION GAP: 7 (ref 5–15)
BUN: 28 mg/dL — AB (ref 6–20)
CALCIUM: 7.5 mg/dL — AB (ref 8.9–10.3)
CO2: 21 mmol/L — ABNORMAL LOW (ref 22–32)
Chloride: 110 mmol/L (ref 101–111)
Creatinine, Ser: 1.35 mg/dL — ABNORMAL HIGH (ref 0.61–1.24)
GFR calc Af Amer: 49 mL/min — ABNORMAL LOW (ref >60)
GFR, EST NON AFRICAN AMERICAN: 43 mL/min — AB (ref >60)
Glucose, Bld: 113 mg/dL — ABNORMAL HIGH (ref 65–99)
POTASSIUM: 3.8 mmol/L (ref 3.5–5.1)
SODIUM: 138 mmol/L (ref 135–145)

## 2016-06-12 LAB — IRON AND TIBC
IRON: 19 ug/dL — AB (ref 45–182)
SATURATION RATIOS: 11 % — AB (ref 17.9–39.5)
TIBC: 169 ug/dL — AB (ref 250–450)
UIBC: 150 ug/dL

## 2016-06-12 LAB — GLUCOSE, CAPILLARY
GLUCOSE-CAPILLARY: 111 mg/dL — AB (ref 65–99)
GLUCOSE-CAPILLARY: 93 mg/dL (ref 65–99)
GLUCOSE-CAPILLARY: 102 mg/dL — AB (ref 65–99)
Glucose-Capillary: 100 mg/dL — ABNORMAL HIGH (ref 65–99)
Glucose-Capillary: 117 mg/dL — ABNORMAL HIGH (ref 65–99)

## 2016-06-12 LAB — BLOOD GAS, ARTERIAL
Acid-base deficit: 4.4 mmol/L — ABNORMAL HIGH (ref 0.0–2.0)
Bicarbonate: 20.7 mEq/L (ref 20.0–24.0)
DRAWN BY: 221791
FIO2: 21
O2 SAT: 92.2 %
PATIENT TEMPERATURE: 37
TCO2: 9.5 mmol/L (ref 0–100)
pCO2 arterial: 36.2 mmHg (ref 35.0–45.0)
pH, Arterial: 7.362 (ref 7.350–7.450)
pO2, Arterial: 71.8 mmHg — ABNORMAL LOW (ref 80.0–100.0)

## 2016-06-12 LAB — CBC
HCT: 23.1 % — ABNORMAL LOW (ref 39.0–52.0)
Hemoglobin: 7.4 g/dL — ABNORMAL LOW (ref 13.0–17.0)
MCH: 32.2 pg (ref 26.0–34.0)
MCHC: 32 g/dL (ref 30.0–36.0)
MCV: 100.4 fL — ABNORMAL HIGH (ref 78.0–100.0)
PLATELETS: 210 10*3/uL (ref 150–400)
RBC: 2.3 MIL/uL — AB (ref 4.22–5.81)
RDW: 16.2 % — AB (ref 11.5–15.5)
WBC: 13.1 10*3/uL — AB (ref 4.0–10.5)

## 2016-06-12 LAB — RETICULOCYTES
RBC.: 2.34 MIL/uL — ABNORMAL LOW (ref 4.22–5.81)
Retic Count, Absolute: 159.1 10*3/uL (ref 19.0–186.0)
Retic Ct Pct: 6.8 % — ABNORMAL HIGH (ref 0.4–3.1)

## 2016-06-12 LAB — FERRITIN: Ferritin: 99 ng/mL (ref 24–336)

## 2016-06-12 LAB — FOLATE: Folate: 19.1 ng/mL (ref >5.9)

## 2016-06-12 LAB — VITAMIN B12: VITAMIN B 12: 964 pg/mL — AB (ref 180–914)

## 2016-06-12 MED ORDER — BISACODYL 10 MG RE SUPP
10.0000 mg | Freq: Two times a day (BID) | RECTAL | Status: DC
  Administered 2016-06-12 – 2016-06-14 (×3): 10 mg via RECTAL
  Filled 2016-06-12 (×3): qty 1

## 2016-06-12 MED ORDER — BISACODYL 10 MG RE SUPP
10.0000 mg | Freq: Once | RECTAL | Status: AC
  Administered 2016-06-12: 10 mg via RECTAL
  Filled 2016-06-12: qty 1

## 2016-06-12 NOTE — Progress Notes (Signed)
PROGRESS NOTE    [name]  ZOX:[id number] DOB: November 17, 1918 DOA: 06/10/2016 PCP: Dwana Melena, MD  Outpatient Specialists:   Brief Narrative:  46 yom with a hx of CAD s/p CABG, DM type 2, and afib presented with complaints of emesis. While in the ED, he was found to have he was noted to have a UTI, and also became more confused and agitated. He was subsequently admitted for further management of acute encephalopathy secondary to UTI.    Assessment & Plan:   Principal Problem:   Encephalopathy acute Active Problems:   Diabetes mellitus type 2 with complications (HCC)   Chronic atrial fibrillation (HCC)   Ileus (HCC)   Elevated troponin I level   UTI (lower urinary tract infection)  1. Acute encephalopathy. No noticeable improvement. Unclear etiology, possibly secondary to UTI. Anticipate improvement with treatment of UTI. Check ABG. 2. UTI. Patient is currently on Rocephin, follow up urine Cx. On CT scan he has evidence of indwelling uretral stent and non-obstructing uretral calculi. No evidence of stranding or uretral obstruction. 3. Ileus. Nausea and vomiting likely due to ileus. But appears to have resolved.  Continue protonix and keep NPO until more alert. Will provide dulcolax suppositories.  4. Leukocytosis. Improving. Etiology possibly related to UTI. Blood culture negative for 24 hrs; if positive, PICC line will likely need to be removed. Patient was receiving Ancef prior to admission. Indication is unclear at this time.  5. Anemia. No evidence of gross bleeding. Likely related to anemia of acute illness as well as a component of dilution. Will check anemia panel. Will check Hgb in the am, and transfuse if Hgb less than 7. 6. Recent MSSA bacteremia. Patient was discharged on 6/27 after being evaluated for MSSA bacteremia. He was discharged on a course of Ancef to be completed on 7/21. 7. Elevated troponin I level. Likely demand ischemia. 8. Chronic atrial fib. Stable. CHADSVASC  4. Patient not chronically anticoagulated due to fall risk. 9. Chronic diastolic CHF. Past EF is 55-60%. Patient is already on a beta blocker. 10. DM type 2. Continue SSI.    DVT prophylaxis: Lovenox Code Status: DNR Family Communication: no family present at bedside  Disposition Plan: discharge back to [facility] once improved.   Consultants:   none  Procedures:   none  Antimicrobials:   Rocephin  7/14 >> 7/15  Ancef Prior to admission >> 7/21   Subjective: Does not respond to voice or touch.  Objective: Filed Vitals:   06/11/16 0500 06/11/16 1428 06/11/16 2052 06/12/16 0502  BP: 97/62 105/71 124/55 105/55  Pulse: 77 88 74 82  Temp: 98.5 F (36.9 C) 98.2 F (36.8 C) 97.4 F (36.3 C) 97.6 F (36.4 C)  TempSrc: Axillary  Axillary Axillary  Resp: 28 22 20 20   Height:      Weight:      SpO2: 99% 99% 98% 97%    Intake/Output Summary (Last 24 hours) at 06/12/16 0642 Last data filed at 06/12/16 0516  Gross per 24 hour  Intake    500 ml  Output   1300 ml  Net   -800 ml   Filed Weights   06/10/16 1115  Weight: 86.183 kg (190 lb)   Examination:  General exam: Appears calm and comfortable. Appears pale  Respiratory system: Crackles at basses. Respiratory effort normal. Cardiovascular system: S1 & S2 heard, irregular rate. No JVD, murmurs, rubs, gallops or clicks. No pedal edema. Gastrointestinal system: Abdomen is nondistended, soft and nontender. No organomegaly  or masses felt. Normal bowel sounds heard. Central nervous system: lethargic Extremities: Symmetric Skin: No rashes, lesions or ulcers Psychiatry: Cannot assess due to mental status  Data Reviewed: I have personally reviewed following labs and imaging studies  CBC:  Recent Labs Lab 06/10/16 1159 06/11/16 0618  WBC 20.1* 20.0*  NEUTROABS 16.8*  --   HGB 9.3* 7.9*  HCT 29.2* 23.9*  MCV 98.3 98.4  PLT 317 253   Basic Metabolic Panel:  Recent Labs Lab 06/10/16 1159 06/11/16 0618    NA 133* 137  K 4.0 3.8  CL 103 107  CO2 23 22  GLUCOSE 237* 96  BUN 24* 30*  CREATININE 1.23 1.39*  CALCIUM 7.5* 7.4*   GFR: Estimated Creatinine Clearance: 32.9 mL/min (by C-G formula based on Cr of 1.39). Liver Function Tests:  Recent Labs Lab 06/10/16 1159  AST 30  ALT 8*  ALKPHOS 77  BILITOT 0.5  PROT 5.4*  ALBUMIN 2.2*    Recent Labs Lab 06/10/16 1159  LIPASE 21   Cardiac Enzymes:  Recent Labs Lab 06/10/16 1159 06/10/16 1849 06/11/16 0030 06/11/16 0618  TROPONINI 0.14* 0.14* 0.15* 0.15*   CBG:  Recent Labs Lab 06/11/16 1141 06/11/16 1634 06/11/16 2045 06/12/16 0051 06/12/16 0438  GLUCAP 110* 117* 117* 100* 111*   Urine analysis:    Component Value Date/Time   COLORURINE YELLOW 06/10/2016 1211   APPEARANCEUR HAZY* 06/10/2016 1211   LABSPEC 1.025 06/10/2016 1211   PHURINE 5.5 06/10/2016 1211   GLUCOSEU NEGATIVE 06/10/2016 1211   HGBUR LARGE* 06/10/2016 1211   BILIRUBINUR NEGATIVE 06/10/2016 1211   KETONESUR TRACE* 06/10/2016 1211   PROTEINUR 30* 06/10/2016 1211   UROBILINOGEN 0.2 01/29/2012 2204   NITRITE NEGATIVE 06/10/2016 1211   LEUKOCYTESUR MODERATE* 06/10/2016 1211   Sepsis Labs: @LABRCNTIP (procalcitonin:4,lacticidven:4)  ) Recent Results (from the past 240 hour(s))  MRSA PCR Screening     Status: None   Collection Time: 06/10/16  6:42 PM  Result Value Ref Range Status   MRSA by PCR NEGATIVE NEGATIVE Final    Comment:        The GeneXpert MRSA Assay (FDA approved for NASAL specimens only), is one component of a comprehensive MRSA colonization surveillance program. It is not intended to diagnose MRSA infection nor to guide or monitor treatment for MRSA infections.   Culture, blood (routine x 2)     Status: None (Preliminary result)   Collection Time: 06/11/16  3:41 PM  Result Value Ref Range Status   Specimen Description BLOOD LEFT WRIST  Final   Special Requests BOTTLES DRAWN AEROBIC AND ANAEROBIC 8CC EACH  Final    Culture PENDING  Incomplete   Report Status PENDING  Incomplete  Culture, blood (routine x 2)     Status: None (Preliminary result)   Collection Time: 06/11/16  3:44 PM  Result Value Ref Range Status   Specimen Description BLOOD PICC LINE DRAW  Final   Special Requests BOTTLES DRAWN AEROBIC AND ANAEROBIC [facility]6CC EACH  Final   Culture PENDING  Incomplete   Report Status PENDING  Incomplete         Radiology Studies: Ct Abdomen Pelvis Wo Contrast  06/10/2016  CLINICAL DATA:  Abdominal pain, vomiting starting yesterday EXAM: CT ABDOMEN AND PELVIS WITHOUT CONTRAST TECHNIQUE: Multidetector CT imaging of the abdomen and pelvis was performed following the standard protocol without IV contrast. COMPARISON:  05/20/2016 FINDINGS: Lower chest: There is small left pleural effusion. Small to moderate right pleural effusion. Bilateral lower lobe posterior atelectasis.  Mitral valve calcifications are noted. Hepatobiliary: Unenhanced liver shows no biliary ductal dilatation. Status postcholecystectomy. Pancreas: Unenhanced pancreas is atrophic Spleen: Unenhanced spleen is unremarkable. Adrenals/Urinary Tract: No adrenal gland mass. Unenhanced kidneys are symmetrical in size. Mild lobulated renal contour. A right ureteral stent in place. Tiny nonobstructive calculus midpole of the left kidney measures 2 mm. No hydronephrosis or hydroureter. The urinary bladder is empty decompressed. There is a Foley catheter within bladder. Stomach/Bowel: There is moderate gastric distension with gas and some fluid highly suspicious for gastric ileus. Moderate gaseous distension of the small bowel with few air-fluid levels. Some contrast material noted in mid small bowel loops. Findings suspicious for diffuse small bowel ileus, early small bowel obstruction or enteritis. Clinical correlation is necessary. No transition point in caliber of small bowel is noted. Abundant stool and some colonic gas noted within cecum. There is no pericecal  inflammation. Normal appendix is partially visualized in axial image 54. No colonic diverticula are noted proximal sigmoid colon. No evidence of acute diverticulitis. Vascular/Lymphatic: Atherosclerotic calcifications of abdominal aorta and iliac arteries. No aortic aneurysm. No retroperitoneal or mesenteric adenopathy. Reproductive: No pelvic mass.  No adenopathy. Other: No abdominal ascites or free abdominal air. There is a tiny umbilical hernia containing fat without evidence of acute complication. Musculoskeletal: No destructive bony lesions are noted. Degenerative changes are noted thoracolumbar spine. There is posterior fusion at L4-L5 level with alignment preserved. Disc space flattening with vacuum disc phenomenon noted at L1-L2 and L2-L3 level. IMPRESSION: 1. Small left pleural effusion. Small to moderate right pleural effusion. Bilateral lower lobe posterior atelectasis. 2. There is moderate gastric distension with gas and fluid. Highly suspicious for gastroparesis or gastric ileus. 3. Moderate gaseous distended small bowel loops with few air-fluid levels. Findings suspicious for ileus, early small bowel obstruction or diffuse enteritis. Clinical correlation is necessary. There is no transition point in caliber of small bowel. 4. Moderate stool and gas noted within cecum and right colon. No pericecal inflammation. Normal appendix is partially visualized. Some colonic gas noted within mid sigmoid colon. No distal colonic obstruction. Colonic diverticula are noted sigmoid colon without evidence of acute diverticulitis. 5. A right ureteral stent in place is noted. There is a Foley catheter within decompressed urinary bladder. 6. No evidence of free abdominal air. Electronically Signed   By: Natasha Mead M.D.   On: 06/10/2016 15:06   Dg Chest Port 1 View  06/10/2016  CLINICAL DATA:  Confusion with pain EXAM: PORTABLE CHEST 1 VIEW COMPARISON:  May 22, 2016 FINDINGS: There is generalized cardiomegaly with  pulmonary vascularity within normal limits. There is slight bibasilar atelectasis. There is no edema or consolidation. No adenopathy. There is atherosclerotic calcification in the aorta. The patient is status post coronary artery bypass grafting. Central catheter tip is in the superior vena cava. No pneumothorax. There is degenerative change in each shoulder. IMPRESSION: Cardiomegaly. Mild bibasilar atelectasis. No airspace consolidation. There is aortic atherosclerosis. Central catheter tip in superior vena cava. Electronically Signed   By: Bretta Bang III M.D.   On: 06/10/2016 12:44   Scheduled Meds: . aspirin EC  81 mg Oral QPM  . atorvastatin  40 mg Oral q1800  .  ceFAZolin (ANCEF) IV  1 g Intravenous Q8H  . cefTRIAXone (ROCEPHIN)  IV  1 g Intravenous Q24H  . dutasteride  0.5 mg Oral QPM  . enoxaparin (LOVENOX) injection  40 mg Subcutaneous Q24H  . gabapentin  100 mg Oral Daily  . insulin aspart  0-15  Units Subcutaneous Q4H  . mirabegron ER  50 mg Oral Daily  . pindolol  5 mg Oral Daily  . sodium chloride flush  3 mL Intravenous Q12H   Continuous Infusions:   Time spent: 25 minutes  Erick Blinks, MD [facility] Pager [phone]  If 7PM-7AM, please contact night-coverage [email or web address] Password [email or web address] 06/12/2016, 6:42 AM    By signing my name below, I, Adron Bene, attest that this documentation has been prepared under the direction and in the presence of Erick Blinks, MD. Electronically Signed: Adron Bene 06/12/2016 10:00am  I, Dr. Erick Blinks, personally performed the services described in this documentaiton. All medical record entries made by the scribe were at my direction and in my presence. I have reviewed the chart and agree that the record reflects my personal performance and is accurate and complete  Erick Blinks, MD, 06/12/2016 10:45 AM

## 2016-06-13 ENCOUNTER — Inpatient Hospital Stay (HOSPITAL_COMMUNITY): Payer: Medicare Other

## 2016-06-13 LAB — GLUCOSE, CAPILLARY
GLUCOSE-CAPILLARY: 121 mg/dL — AB (ref 65–99)
GLUCOSE-CAPILLARY: 128 mg/dL — AB (ref 65–99)
GLUCOSE-CAPILLARY: 133 mg/dL — AB (ref 65–99)
GLUCOSE-CAPILLARY: 97 mg/dL (ref 65–99)
Glucose-Capillary: 93 mg/dL (ref 65–99)
Glucose-Capillary: 105 mg/dL — ABNORMAL HIGH (ref 65–99)
Glucose-Capillary: 131 mg/dL — ABNORMAL HIGH (ref 65–99)

## 2016-06-13 LAB — CBC
HEMATOCRIT: 24.8 % — AB (ref 39.0–52.0)
HEMOGLOBIN: 7.8 g/dL — AB (ref 13.0–17.0)
MCH: 31.5 pg (ref 26.0–34.0)
MCHC: 31.5 g/dL (ref 30.0–36.0)
MCV: 100 fL (ref 78.0–100.0)
PLATELETS: 238 10*3/uL (ref 150–400)
RBC: 2.48 MIL/uL — AB (ref 4.22–5.81)
RDW: 16.1 % — ABNORMAL HIGH (ref 11.5–15.5)
WBC: 13.5 10*3/uL — ABNORMAL HIGH (ref 4.0–10.5)

## 2016-06-13 LAB — BASIC METABOLIC PANEL
ANION GAP: 8 (ref 5–15)
BUN: 24 mg/dL — ABNORMAL HIGH (ref 6–20)
CHLORIDE: 112 mmol/L — AB (ref 101–111)
CO2: 21 mmol/L — AB (ref 22–32)
Calcium: 7.8 mg/dL — ABNORMAL LOW (ref 8.9–10.3)
Creatinine, Ser: 1.21 mg/dL (ref 0.61–1.24)
GFR calc non Af Amer: 49 mL/min — ABNORMAL LOW (ref >60)
GFR, EST AFRICAN AMERICAN: 56 mL/min — AB (ref >60)
GLUCOSE: 107 mg/dL — AB (ref 65–99)
POTASSIUM: 4 mmol/L (ref 3.5–5.1)
Sodium: 141 mmol/L (ref 135–145)

## 2016-06-13 MED ORDER — DEXTROSE-NACL 5-0.45 % IV SOLN
INTRAVENOUS | Status: DC
  Filled 2016-06-13 (×3): qty 1000

## 2016-06-13 MED ORDER — DEXTROSE-NACL 5-0.45 % IV SOLN
INTRAVENOUS | Status: DC
  Administered 2016-06-13: 75 mL via INTRAVENOUS
  Administered 2016-06-14: 12:00:00 via INTRAVENOUS

## 2016-06-13 MED ORDER — ALBUTEROL SULFATE (2.5 MG/3ML) 0.083% IN NEBU
2.5000 mg | INHALATION_SOLUTION | RESPIRATORY_TRACT | Status: DC | PRN
  Administered 2016-06-14: 2.5 mg via RESPIRATORY_TRACT
  Filled 2016-06-13: qty 3

## 2016-06-13 MED ORDER — FUROSEMIDE 10 MG/ML IJ SOLN
20.0000 mg | INTRAMUSCULAR | Status: AC
Start: 2016-06-13 — End: 2016-06-13
  Administered 2016-06-13 (×2): 20 mg via INTRAVENOUS
  Filled 2016-06-13 (×2): qty 2

## 2016-06-13 NOTE — Evaluation (Signed)
Clinical/Bedside Swallow Evaluation Patient Details  Name: [name] MRN: [id number] Date of Birth: [birth year]  Today's Date: 06/13/2016 Time: SLP Start Time (ACUTE ONLY): 1354 SLP Stop Time (ACUTE ONLY): 1408 SLP Time Calculation (min) (ACUTE ONLY): 14 min  Past Medical History:  Past Medical History  Diagnosis Date  . Coronary atherosclerosis of native coronary artery     Multivessel status post CABG  . Mixed hyperlipidemia   . Type 2 diabetes mellitus (HCC)   . Atrial fibrillation (HCC)   . Essential hypertension, benign   . Sleep apnea   . Hydroureteronephrosis 03/22/2016  . CKD (chronic kidney disease)   . Severe sepsis with septic shock (CODE) (HCC)   . UTI (lower urinary tract infection)   . Respiratory failure (HCC)   . Thrombocytopenia (HCC)   . Syncope and collapse    Past Surgical History:  Past Surgical History  Procedure Laterality Date  . Tonsillectomy    . Cholecystectomy    . Knee arthroscopy    . Hernia repair    . Coronary artery bypass graft  1992    LIMA to LAD and diagonal, SVG to RCA, SVG to OM  . Back surgery    . Esophagogastroduodenoscopy (egd) with esophageal dilation N/A 09/12/2013    Procedure: ESOPHAGOGASTRODUODENOSCOPY (EGD) WITH ESOPHAGEAL DILATION;  Surgeon: Malissa Hippo, MD;  Location: [facility];  Service: Endoscopy;  Laterality: N/A;  . Cystoscopy w/ ureteral stent placement Right 03/22/2016    Procedure: CYSTOSCOPY WITH RIGHT RETROGRADE PYELOGRAM, RIGHT URETERAL STENT PLACEMENT;  Surgeon: Marcine Matar, MD;  Location: [facility];  Service: Urology;  Laterality: Right;   HPI:  79 yom with a hx of CAD s/p CABG, DM type 2, and afib presented with complaints of emesis. While in the ED, he was found to have he was noted to have a UTI, and also became more confused and agitated. He was subsequently admitted for further management of acute encephalopathy secondary to UTI. Chest x-ray shows: Cardiomegaly. Mild bibasilar atelectasis. No  airspace consolidation. There is aortic atherosclerosis. Central catheter tip in superior vena cava.SLP asked to complete BSE.   Assessment / Plan / Recommendation Clinical Impression  Mr. Strollo was seen at bedside for BSE due to RN reported pt coughing at times with swallow. Pt moaning and thrashing around in bed with eyes closed. Pt reportedly had Ativan over night and it is likely still impacting alertness. He was practically sideways in bed so pt repositioned and oral care completed. Pt did respond to stimulus in oral cavity (moving sponge around and closing lips), so ice chip presented, however limited response to ice chip. SLP removed ice chip and continued with verbal and tactile cues to increase alertness without success. Recommend continue NPO given decreased alertness. Discussed with RN with recommendations to try and hold Ativan over night and SLP will see pt again in AM.    Aspiration Risk  Moderate aspiration risk    Diet Recommendation NPO except meds (crush meds in puree when/if pt alert otherwise other means)   Medication Administration: Crushed with puree    Other  Recommendations Oral Care Recommendations: Oral care QID;Staff/trained caregiver to provide oral care   Follow up Recommendations  24 hour supervision/assistance    Frequency and Duration min 2x/week  1 week       Prognosis Prognosis for Safe Diet Advancement: Fair Barriers to Reach Goals: Medication      Swallow Study   General Date of Onset: 06/10/16 HPI: 89 yom  with a hx of CAD s/p CABG, DM type 2, and afib presented with complaints of emesis. While in the ED, he was found to have he was noted to have a UTI, and also became more confused and agitated. He was subsequently admitted for further management of acute encephalopathy secondary to UTI. Chest x-ray shows: Cardiomegaly. Mild bibasilar atelectasis. No airspace consolidation. There is aortic atherosclerosis. Central catheter tip in superior vena  cava.SLP asked to complete BSE. Type of Study: Bedside Swallow Evaluation Previous Swallow Assessment: none on record Diet Prior to this Study: NPO Temperature Spikes Noted: No Respiratory Status: Room air History of Recent Intubation: No Behavior/Cognition: Lethargic/Drowsy (moaning) Oral Cavity Assessment: Dried secretions Oral Care Completed by SLP: Yes Oral Cavity - Dentition: Missing dentition (has some dentition) Vision: Impaired for self-feeding Self-Feeding Abilities: Total assist Patient Positioning: Upright in bed Baseline Vocal Quality: Normal Volitional Cough: Cognitively unable to elicit Volitional Swallow: Unable to elicit    Oral/Motor/Sensory Function Overall Oral Motor/Sensory Function: Moderate impairment   Ice Chips Ice chips: Impaired Presentation: Spoon Oral Phase Impairments: Reduced lingual movement/coordination;Poor awareness of bolus Oral Phase Functional Implications: Oral residue;Oral holding Pharyngeal Phase Impairments:  (Ice chip removed by SLp)   Thin Liquid Thin Liquid: Not tested    Nectar Thick Nectar Thick Liquid: Not tested   Honey Thick Honey Thick Liquid: Not tested   Puree Puree: Not tested   Solid   Thank you,  Havery MorosDabney Porter, CCC-SLP [phone]    Solid: Not tested        PORTER,DABNEY 06/13/2016,2:15 PM

## 2016-06-13 NOTE — Progress Notes (Signed)
Late Entry for 06/12/2016 Patient received a milk and molasses enema that MD stated it was okay to give.  The patient had 2 bms yesterday and according to the night nurse I received report from a large one last night.  I notified Dr. Kerry HoughMemon of the BM's yesterday.  Today I will notify him of the one last night and ask if the patient can begin to try and eat as long as he is alert to do so.

## 2016-06-13 NOTE — Progress Notes (Signed)
PROGRESS NOTE    Kyle CardGeorge W [name]  ZOX:[id number] DOB: Apr 07, 1918 DOA: 06/10/2016 PCP: [name], MD  Outpatient Specialists:   Brief Narrative:  [age] yom with a hx of CAD s/p CABG, DM type 2, and afib presented with complaints of emesis. While in the ED, he was found to have he was noted to have a UTI, and also became more confused and agitated. He was subsequently admitted for further management of acute encephalopathy secondary to UTI. No noticeable improvement in patient's mental status was noted after initially treating him for his underlying UTI. Continue to monitor.    Assessment & Plan:   Principal Problem:   Encephalopathy acute Active Problems:   Diabetes mellitus type 2 with complications (HCC)   Chronic atrial fibrillation (HCC)   Ileus (HCC)   Elevated troponin I level   UTI (lower urinary tract infection)   Anemia  1. Acute encephalopathy. Unclear etiology, possibly secondary to UTI. Anticipate improvement with treatment of UTI. ABG unremarkable. 2. UTI. Patient is currently on Rocephin, follow up urine Cx. On CT scan he has evidence of indwelling uretral stent and non-obstructing uretral calculi. No evidence of stranding or uretral obstruction. 3. Ileus. Nausea and vomiting likely due to ileus. But appears to have resolved. Patient now having bowel movements with suppositories. No further vomiting.  Will advance diet after seen by speech since nursing reports patient coughs after sips of water 4. Leukocytosis. Improving. Etiology possibly related to UTI. Blood culture negative for 24 hrs; if positive, PICC line will likely need to be removed. Patient was receiving Ancef prior to admission for MSSA bacteremia.   5. Microcytic anemia. No evidence of gross bleeding. Likely related to iron deficiency. Fecal occult blood test positive on admission, but no evidence of gross bleeding, anemia profile shows iron level of 19. Transfuse if Hgb drops below 7. Would not pursue invasive  work up due to patient's advanced age and co morbidities 6. Recent MSSA bacteremia. Patient was discharged on 6/27 after being evaluated for MSSA bacteremia. He was discharged on a course of Ancef to be completed on 7/21. 7. Elevated troponin I level. Likely demand ischemia. 8. Chronic atrial fib. Stable. CHADSVASC 4. Patient not chronically anticoagulated due to fall risk. 9. Chronic diastolic CHF. Past EF is 55-60%. Patient is already on a beta blocker. 10. DM type 2. Continue SSI.    DVT prophylaxis: Lovenox Code Status: DNR Family Communication: no family present at bedside  Disposition Plan: discharge back to [facility] once improved.   Consultants:   none  Procedures:   none  Antimicrobials:   Rocephin  7/14 >> 7/15  Ancef Prior to admission >> 7/21   Subjective: Patient is lethargic and cannot participate in exam. Staff reports he was recently medicated for agitation  Objective: Filed Vitals:   06/12/16 0502 06/12/16 1331 06/12/16 2112 06/13/16 0555  BP: 105/55 104/89 115/86 133/62  Pulse: 82 64 74 78  Temp: 97.6 F (36.4 C) 97.5 F (36.4 C) 97.8 F (36.6 C) 97.9 F (36.6 C)  TempSrc: Axillary  Oral Axillary  Resp: 20 20 20 20   Height:      Weight:      SpO2: 97% 100% 93% 95%    Intake/Output Summary (Last 24 hours) at 06/13/16 0611 Last data filed at 06/12/16 1300  Gross per 24 hour  Intake      0 ml  Output      0 ml  Net      0 ml  Filed Weights   06/10/16 1115  Weight: 86.183 kg (190 lb)   Examination:  General exam: Appears somnolent Respiratory system: Clear to auscultation. Respiratory effort normal. Cardiovascular system: S1 & S2 heard, RRR. No JVD, murmurs, rubs, gallops or clicks. No pedal edema. Gastrointestinal system: Abdomen is nondistended, soft and nontender. No organomegaly or masses felt. Normal bowel sounds heard. Central nervous system: cannot assess due to mental status Extremities: Symmetric 5 x 5 power. Skin: No  rashes, lesions or ulcers Psychiatry: cannot assess due to mental state   Data Reviewed: I have personally reviewed following labs and imaging studies  CBC:  Recent Labs Lab 06/10/16 1159 06/11/16 0618 06/12/16 0656  WBC 20.1* 20.0* 13.1*  NEUTROABS 16.8*  --   --   HGB 9.3* 7.9* 7.4*  HCT 29.2* 23.9* 23.1*  MCV 98.3 98.4 100.4*  PLT 317 253 210   Basic Metabolic Panel:  Recent Labs Lab 06/10/16 1159 06/11/16 0618 06/12/16 0656  NA 133* 137 138  K 4.0 3.8 3.8  CL 103 107 110  CO2 23 22 21*  GLUCOSE 237* 96 113*  BUN 24* 30* 28*  CREATININE 1.23 1.39* 1.35*  CALCIUM 7.5* 7.4* 7.5*   GFR: Estimated Creatinine Clearance: 33.9 mL/min (by C-G formula based on Cr of 1.35). Liver Function Tests:  Recent Labs Lab 06/10/16 1159  AST 30  ALT 8*  ALKPHOS 77  BILITOT 0.5  PROT 5.4*  ALBUMIN 2.2*    Recent Labs Lab 06/10/16 1159  LIPASE 21   Cardiac Enzymes:  Recent Labs Lab 06/10/16 1159 06/10/16 1849 06/11/16 0030 06/11/16 0618  TROPONINI 0.14* 0.14* 0.15* 0.15*   CBG:  Recent Labs Lab 06/12/16 1142 06/12/16 1659 06/12/16 2110 06/13/16 0029 06/13/16 0353  GLUCAP 93 102* 117* 121* 133*   Urine analysis:    Component Value Date/Time   COLORURINE YELLOW 06/10/2016 1211   APPEARANCEUR HAZY* 06/10/2016 1211   LABSPEC 1.025 06/10/2016 1211   PHURINE 5.5 06/10/2016 1211   GLUCOSEU NEGATIVE 06/10/2016 1211   HGBUR LARGE* 06/10/2016 1211   BILIRUBINUR NEGATIVE 06/10/2016 1211   KETONESUR TRACE* 06/10/2016 1211   PROTEINUR 30* 06/10/2016 1211   UROBILINOGEN 0.2 01/29/2012 2204   NITRITE NEGATIVE 06/10/2016 1211   LEUKOCYTESUR MODERATE* 06/10/2016 1211   Sepsis Labs: @LABRCNTIP (procalcitonin:4,lacticidven:4)  ) Recent Results (from the past 240 hour(s))  MRSA PCR Screening     Status: None   Collection Time: 06/10/16  6:42 PM  Result Value Ref Range Status   MRSA by PCR NEGATIVE NEGATIVE Final    Comment:        The GeneXpert MRSA Assay  (FDA approved for NASAL specimens only), is one component of a comprehensive MRSA colonization surveillance program. It is not intended to diagnose MRSA infection nor to guide or monitor treatment for MRSA infections.   Culture, blood (routine x 2)     Status: None (Preliminary result)   Collection Time: 06/11/16  3:41 PM  Result Value Ref Range Status   Specimen Description BLOOD LEFT WRIST  Final   Special Requests BOTTLES DRAWN AEROBIC AND ANAEROBIC 8CC EACH  Final   Culture NO GROWTH < 24 HOURS  Final   Report Status PENDING  Incomplete  Culture, blood (routine x 2)     Status: None (Preliminary result)   Collection Time: 06/11/16  3:44 PM  Result Value Ref Range Status   Specimen Description BLOOD PICC LINE DRAW  Final   Special Requests BOTTLES DRAWN AEROBIC AND ANAEROBIC [facility] EACH  Final   Culture NO GROWTH < 24 HOURS  Final   Report Status PENDING  Incomplete   Scheduled Meds: . aspirin EC  81 mg Oral QPM  . atorvastatin  40 mg Oral q1800  . bisacodyl  10 mg Rectal BID  .  ceFAZolin (ANCEF) IV  1 g Intravenous Q8H  . dutasteride  0.5 mg Oral QPM  . enoxaparin (LOVENOX) injection  40 mg Subcutaneous Q24H  . gabapentin  100 mg Oral Daily  . insulin aspart  0-15 Units Subcutaneous Q4H  . mirabegron ER  50 mg Oral Daily  . pindolol  5 mg Oral Daily  . sodium chloride flush  3 mL Intravenous Q12H   Continuous Infusions:   Time spent: 25 minutes  Erick Blinks, MD [facility] Pager [phone]  If 7PM-7AM, please contact night-coverage [email or web address] Password [email or web address] 06/13/2016, 6:11 AM    By signing my name below, I, Adron Bene, attest that this documentation has been prepared under the direction and in the presence of Erick Blinks, MD. Electronically Signed: Adron Bene 06/13/2016   I, Dr. Erick Blinks, personally performed the services described in this documentaiton. All medical record entries made by the scribe were at my direction and  in my presence. I have reviewed the chart and agree that the record reflects my personal performance and is accurate and complete  Erick Blinks, MD, 06/13/2016 7:19 PM

## 2016-06-13 NOTE — Progress Notes (Signed)
[facility] was paged to inform that results of stat chest xray have posted.

## 2016-06-14 ENCOUNTER — Ambulatory Visit: Payer: Medicare Other | Admitting: Urology

## 2016-06-14 DIAGNOSIS — B49 Unspecified mycosis: Secondary | ICD-10-CM | POA: Diagnosis present

## 2016-06-14 LAB — BLOOD CULTURE ID PANEL (REFLEXED)
ACINETOBACTER BAUMANNII: NOT DETECTED
CARBAPENEM RESISTANCE: NOT DETECTED
Candida albicans: NOT DETECTED
Candida glabrata: DETECTED — AB
Candida krusei: NOT DETECTED
Candida parapsilosis: NOT DETECTED
Candida tropicalis: NOT DETECTED
ENTEROBACTERIACEAE SPECIES: NOT DETECTED
Enterobacter cloacae complex: NOT DETECTED
Enterococcus species: NOT DETECTED
Escherichia coli: NOT DETECTED
HAEMOPHILUS INFLUENZAE: NOT DETECTED
Klebsiella oxytoca: NOT DETECTED
Klebsiella pneumoniae: NOT DETECTED
LISTERIA MONOCYTOGENES: NOT DETECTED
METHICILLIN RESISTANCE: NOT DETECTED
NEISSERIA MENINGITIDIS: NOT DETECTED
PSEUDOMONAS AERUGINOSA: NOT DETECTED
Proteus species: NOT DETECTED
SERRATIA MARCESCENS: NOT DETECTED
STAPHYLOCOCCUS AUREUS BCID: NOT DETECTED
STAPHYLOCOCCUS SPECIES: NOT DETECTED
STREPTOCOCCUS AGALACTIAE: NOT DETECTED
STREPTOCOCCUS PYOGENES: NOT DETECTED
STREPTOCOCCUS SPECIES: NOT DETECTED
Streptococcus pneumoniae: NOT DETECTED
Vancomycin resistance: NOT DETECTED

## 2016-06-14 LAB — CBC
HEMATOCRIT: 26.7 % — AB (ref 39.0–52.0)
Hemoglobin: 8.5 g/dL — ABNORMAL LOW (ref 13.0–17.0)
MCH: 31.6 pg (ref 26.0–34.0)
MCHC: 31.8 g/dL (ref 30.0–36.0)
MCV: 99.3 fL (ref 78.0–100.0)
Platelets: 246 10*3/uL (ref 150–400)
RBC: 2.69 MIL/uL — AB (ref 4.22–5.81)
RDW: 16.4 % — AB (ref 11.5–15.5)
WBC: 11.9 10*3/uL — AB (ref 4.0–10.5)

## 2016-06-14 LAB — BASIC METABOLIC PANEL
ANION GAP: 8 (ref 5–15)
BUN: 22 mg/dL — AB (ref 6–20)
CHLORIDE: 108 mmol/L (ref 101–111)
CO2: 24 mmol/L (ref 22–32)
Calcium: 7.6 mg/dL — ABNORMAL LOW (ref 8.9–10.3)
Creatinine, Ser: 1.21 mg/dL (ref 0.61–1.24)
GFR, EST AFRICAN AMERICAN: 56 mL/min — AB (ref >60)
GFR, EST NON AFRICAN AMERICAN: 49 mL/min — AB (ref >60)
Glucose, Bld: 136 mg/dL — ABNORMAL HIGH (ref 65–99)
POTASSIUM: 3.3 mmol/L — AB (ref 3.5–5.1)
SODIUM: 140 mmol/L (ref 135–145)

## 2016-06-14 LAB — GLUCOSE, CAPILLARY
GLUCOSE-CAPILLARY: 129 mg/dL — AB (ref 65–99)
GLUCOSE-CAPILLARY: 120 mg/dL — AB (ref 65–99)
GLUCOSE-CAPILLARY: 137 mg/dL — AB (ref 65–99)
Glucose-Capillary: 115 mg/dL — ABNORMAL HIGH (ref 65–99)
Glucose-Capillary: 136 mg/dL — ABNORMAL HIGH (ref 65–99)
Glucose-Capillary: 152 mg/dL — ABNORMAL HIGH (ref 65–99)

## 2016-06-14 LAB — URINE CULTURE

## 2016-06-14 MED ORDER — FUROSEMIDE 10 MG/ML IJ SOLN
40.0000 mg | INTRAMUSCULAR | Status: AC
  Administered 2016-06-14: 40 mg via INTRAVENOUS
  Filled 2016-06-14: qty 4

## 2016-06-14 MED ORDER — MORPHINE SULFATE (PF) 2 MG/ML IV SOLN
2.0000 mg | INTRAVENOUS | Status: DC | PRN
  Administered 2016-06-14 – 2016-06-15 (×3): 2 mg via INTRAVENOUS
  Filled 2016-06-14 (×3): qty 1

## 2016-06-14 MED ORDER — BISACODYL 10 MG RE SUPP: 10.0000 mg | Freq: Every day | RECTAL | Status: DC | PRN

## 2016-06-14 NOTE — Care Management Important Message (Signed)
Important Message  Patient Details  Name: Stormy CardGeorge W Tavares MRN: [id number] Date of Birth: Jan 08, 1918   Medicare Important Message Given:  Yes    Fuller PlanWelborn, Ashley Montminy M, RN 06/14/2016, 12:46 PM

## 2016-06-14 NOTE — Progress Notes (Signed)
0100 - Called by nurse with concerns about worsening respiratory distress.  NP was previously notified and ordered CXR and 40 mg Lasix in divided doses.  This was unsuccessful and patient also received a nebulizer treatment.  He is currently wheezing and SOB with O2 sats in 80s.  Will go to examine patient.  0110 - Patient is audibly wheezing at bedside and has poor air movement throughout.  He did not open his eyes but did flinch a bit while being examined.  He has continued to get IVF so will KVO fluids now.  Creatinine was appropriate today and so will give an additional 40 mg IV Lasix x 1 now.  Will also attempt to contact his NOK to determine how aggressive they wish to be - he is DNR.  [id number] - I spoke with his daughter and she states that the patient would prefer a comfort approach.  He did have a similar episode during a prior recent hospitalization, per daughter's report, and it resolved spontaneously.  I have explained to her that his condition is serious and quite possibly terminal and that I will notify her if anything changes.  I will also order morphine prn for air hunger.  Georgana CurioJennifer E. Javeon Macmurray, M.D.

## 2016-06-14 NOTE — Progress Notes (Signed)
Patient's blood culture positive for yeast. Dr. Kerry HoughMemon notified.

## 2016-06-14 NOTE — Progress Notes (Signed)
Patient has labored breathing and wheezing. Contacted RT to give PRN breathing treatment. Patient did not have much improvement after the breathing treatment. Contacted hospitalist. Hospitalist came up to the floor to examine patient. New order received to give 40mg  iv lasix. Hospitalist says that she will contact patient's family to see how aggressive they wish to be. Will continue to monitor closely.

## 2016-06-14 NOTE — Progress Notes (Signed)
Speech Language Pathology Treatment: Dysphagia  Patient Details Name: [name] MRN: [id number] DOB: Dec 06, 1917 Today's Date: 06/14/2016 Time: [id number] SLP Time Calculation (min) (ACUTE ONLY): 26 min  Assessment / Plan / Recommendation Clinical Impression  [name] was more alert today, however became increasingly agitated with SLP involvement. He became defensive when SLP attempted oral care (turning head, yelling, batting/waving arms, closing mouth) so SLP provided single ice chips which he readily accepted initially. Pt required max verbal cues to close lips, move tongue to manipulate bolus, and swallow. Pt accepted one small sip water via teaspoon and 2 presentations of HTL tea via teaspoon before yelling and refusing additional po despite encouragement. Pt does appear to swallow when alert/willing, however behavior (agitation) and variable levels of alertness continue to inhibit adequate po trials to determine safety for po. Recommend continue NPO with oral care and could try po meds crushed if pt alert/willing however this is unlikely given current state. SLP will continue to follow for appropriateness for po diet. Should family choose comfort route, consider offering po when alert and willing. Pt given Ativan and morphine earlier today per RN.   HPI HPI: [age] yom with a hx of CAD s/p CABG, DM type 2, and afib presented with complaints of emesis. While in the ED, he was found to have he was noted to have a UTI, and also became more confused and agitated. He was subsequently admitted for further management of acute encephalopathy secondary to UTI. Chest x-ray shows: Cardiomegaly. Mild bibasilar atelectasis. No airspace consolidation. There is aortic atherosclerosis. Central catheter tip in superior vena cava.SLP asked to complete BSE.      SLP Plan  Continue with current plan of care     Recommendations  Diet recommendations: NPO Medication Administration: Crushed with puree (if alert  and cooperative)             Oral Care Recommendations: Oral care QID;Staff/trained caregiver to provide oral care Follow up Recommendations: 24 hour supervision/assistance Plan: Continue with current plan of care     GO                PORTER,DABNEY 06/14/2016, 4:17 PM

## 2016-06-14 NOTE — Progress Notes (Signed)
Initial Nutrition Assessment  DOCUMENTATION CODES:  Not applicable  INTERVENTION:  Monitor for diet advancement for transition to CC.   NUTRITION DIAGNOSIS:  Inadequate oral intake related to lethargy/confusion as evidenced by No documented meal intake since admittance.  GOAL:  Patient will meet greater than or equal to 90% of their needs  MONITOR:  PO intake, Diet advancement, Labs, I & O's  REASON FOR ASSESSMENT:  Low Braden    ASSESSMENT:  82 y/o male PMHx CAD s/p CABG, DM2, A fib, CHF,  CKD, GERD, Recent Septic episode. Resident [facility]. Presents with N/V x 24 hrs as well as increased confusion. Worked up for UTI and ileus. NGT not placed due to agitation.   Pt admitted on 7/14, has had no documented intake as of this time and is currently NPO due to ongoing lethargy that MD reports has not improved. Pt has worsening respiratory distress last night and Per MDs encounter conversation with daughter, the pt would prefer a comfort approach. Pt being monitored for improvement or decline at this time.   Despite large number of comorbidities and multiple recent admissions, pt appears to have been able to maintain his weight.   NFPE: subjectively does appear to have some mild muscle/fat loss, but given his weight stability, this may just be how pt normally presents.   Labs reviewed: Cbgs ~130, WBC: 11.9, ANEMIC,    Recent Labs Lab 06/12/16 0656 06/13/16 0654 06/14/16 0520  NA 138 141 140  K 3.8 4.0 3.3*  CL 110 112* 108  CO2 21* 21* 24  BUN 28* 24* 22*  CREATININE 1.35* 1.21 1.21  CALCIUM 7.5* 7.8* 7.6*  GLUCOSE 113* 107* 136*   Diet Order:  Diet NPO time specified Except for: Sips with Meds  Skin: Pale, Dry, Flaky, abrasion and skin tear to Right leg/knee, PU stage 1 to heel, PU stage II to buttocks  Last BM:  7/17  Height:  Ht Readings from Last 1 Encounters:  06/10/16 5' 7.5" (1.715 m)   Weight:  Wt Readings from Last 1 Encounters:  06/10/16 190 lb  (86.183 kg)   Wt Readings from Last 10 Encounters:  06/10/16 190 lb (86.183 kg)  05/24/16 190 lb 0.6 oz (86.2 kg)  03/22/16 181 lb (82.101 kg)  01/12/16 183 lb 6.4 oz (83.19 kg)  12/03/15 193 lb (87.544 kg)  11/21/15 196 lb (88.905 kg)  11/20/15 196 lb (88.905 kg)  10/15/15 195 lb (88.451 kg)  02/10/15 188 lb (85.276 kg)  10/15/14 190 lb (86.183 kg)   Ideal Body Weight:  68.64 kg  BMI:  Body mass index is 29.3 kg/(m^2).  Estimated Nutritional Needs:  Kcal:  1650-1800 (19-21 kcal/kg bw) Protein:  82-96 g pro (1.2-1.4 g/kg IBW) Fluid:  1.6-1.8 liters fluid  EDUCATION NEEDS:  No education needs identified at this time  [name] RD, LDN, CNSC Clinical Nutrition Pager: [phone] 06/14/2016 1:02 PM

## 2016-06-14 NOTE — Progress Notes (Signed)
PROGRESS NOTE    Kyle CardGeorge W [name]  ZOX:[id number] DOB: 1917-12-06 DOA: 06/10/2016 PCP: [name], MD  Outpatient Specialists:   Brief Narrative:  [age] yom with a hx of CAD s/p CABG, DM type 2, recent MSSA bacteremia on Ancef until 7/21 and afib presented with complaints of emesis. While in the ED, he was noted to have UTI, with associated confusion and agitation. He was subsequently admitted for further management of acute encephalopathy secondary to UTI. No noticeable improvement in patient's mental status was noted after initially treating him for his underlying UTI. His po intake has been poor. Urine culture and blood culture positive for yeast. After discussing with daughter, she wants patient to be made comfort care. Palliative care consulted. Anticipate discharge to residential hospice after palliative meets with family .    Assessment & Plan:   Principal Problem:   Encephalopathy acute Active Problems:   Diabetes mellitus type 2 with complications (HCC)   Chronic atrial fibrillation (HCC)   Ileus (HCC)   Elevated troponin I level   UTI (lower urinary tract infection)   Anemia  1. Acute encephalopathy. Likely multifactorial, related to acute illness/infection. ABG unremarkable. 2. UTI. Patient is currently on Ancef. Urine culture positive for Yeast. On CT scan he has evidence of indwelling uretral stent and non-obstructing uretral calculi. No evidence of stranding or uretral obstruction.  3. Fungemia. Patient has positive blood culture (1 out of 2) for yeast. Discussed patient's condition with patient's daughter who has elected to pursue comfort care, so will not start on antifungals.   4. Ileus. Nausea and vomiting likely due to ileus. But appears to have resolved. Patient now having bowel movements with suppositories. No further vomiting. He was placed on NPO except medication per ST due to moderate aspiration risk.  5. Leukocytosis. Improving. Etiology possibly related to UTI.  Patient was receiving Ancef prior to admission for MSSA bacteremia.   6. Microcytic anemia. No evidence of gross bleeding. Likely related to iron deficiency. Fecal occult blood test positive on admission, but no evidence of gross bleeding, anemia profile shows iron level of 19.Would not pursue invasive work up due to patient's advanced age and co morbidities 7. Recent MSSA bacteremia. Patient was discharged on 6/27 after being evaluated for MSSA bacteremia. He was discharged on a course of Ancef to be completed on 7/21. 8. Elevated troponin I level. Likely demand ischemia. 9. Chronic atrial fib. Remains stable. CHADSVASC 4. Patient not chronically anticoagulated due to fall risk. 10. Acute on Chronic diastolic CHF. Past EF is 55-60%. Patient is already on a beta blocker. Patient developed worsening shortness of breath overnight and CXR showed signs of decompensated CHF. He was given lasix with good urine output. Appears to be breathing comfortably at this time.  11. DM type 2. Continue SSI.  12. Discussion. With patient's advanced age, multiple co morbidities, poor po intake and now evidence of fungemia, I had a discussion with patient's daughter who agrees to change patient to comfort care. I will discontinue antibiotics and simplify medication regimen to include only those meds that are directly related to comfort. She asked about IV fluids, but I advised against it noting that it would likely precipitate worsening CHF. They are agreeable for comfort care. Will consult palliative care. He would be appropriate for residential hospice.   DVT prophylaxis: Lovenox Code Status: DNR Family Communication:  Discussed with Daughter, Marlowe AltSylvia [place] over the phone ( [phone]) Disposition Plan: pending palliative care eval. Would likely be a candidate for  residential hospice   Consultants:   ST- NPO except meds   Procedures:   none  Antimicrobials:   Rocephin  7/14 >> 7/15  Ancef Prior to  admission >> 7/21   Subjective: Patient became increasingly short of breath overnight. Chest xray indicated CHF. Given lasix with good urine output. Today appears to be breathing comfortably. Po intake over last several days has been poor. Still very somnolent  Objective: Filed Vitals:   06/13/16 0555 06/13/16 1430 06/13/16 1947 06/14/16 0037  BP: 133/62 128/59 131/68   Pulse: 78 80 82 86  Temp: 97.9 F (36.6 C) 97.5 F (36.4 C) 97.8 F (36.6 C)   TempSrc: Axillary Axillary Axillary   Resp: 20 22 24    Height:      Weight:      SpO2: 95% 99% 97%     Intake/Output Summary (Last 24 hours) at 06/14/16 0652 Last data filed at 06/14/16 0440  Gross per 24 hour  Intake      0 ml  Output   3700 ml  Net  -3700 ml   Filed Weights   06/10/16 1115  Weight: 86.183 kg (190 lb)   Examination:  General exam: Appears somnolent, does not answer questions Respiratory system: Crackles at bases. Respiratory effort normal. Cardiovascular system: S1 & S2 heard, RRR. murmurs, rubs, gallops or clicks. No pedal edema. Gastrointestinal system: Abdomen is nondistended, soft and nontender. No organomegaly or masses felt. Normal bowel sounds heard. Central nervous system: cannot assess due to mental status Extremities: Symmetric 5 x 5 power. Skin: No rashes, lesions or ulcers Psychiatry: cannot assess due to mental state   Data Reviewed: I have personally reviewed following labs and imaging studies  CBC:  Recent Labs Lab 06/10/16 1159 06/11/16 0618 06/12/16 0656 06/13/16 0654 06/14/16 0520  WBC 20.1* 20.0* 13.1* 13.5* 11.9*  NEUTROABS 16.8*  --   --   --   --   HGB 9.3* 7.9* 7.4* 7.8* 8.5*  HCT 29.2* 23.9* 23.1* 24.8* 26.7*  MCV 98.3 98.4 100.4* 100.0 99.3  PLT 317 253 210 238 246   Basic Metabolic Panel:  Recent Labs Lab 06/10/16 1159 06/11/16 0618 06/12/16 0656 06/13/16 0654 06/14/16 0520  NA 133* 137 138 141 140  K 4.0 3.8 3.8 4.0 3.3*  CL 103 107 110 112* 108  CO2 23 22  21* 21* 24  GLUCOSE 237* 96 113* 107* 136*  BUN 24* 30* 28* 24* 22*  CREATININE 1.23 1.39* 1.35* 1.21 1.21  CALCIUM 7.5* 7.4* 7.5* 7.8* 7.6*   GFR: Estimated Creatinine Clearance: 37.8 mL/min (by C-G formula based on Cr of 1.21). Liver Function Tests:  Recent Labs Lab 06/10/16 1159  AST 30  ALT 8*  ALKPHOS 77  BILITOT 0.5  PROT 5.4*  ALBUMIN 2.2*    Recent Labs Lab 06/10/16 1159  LIPASE 21   Cardiac Enzymes:  Recent Labs Lab 06/10/16 1159 06/10/16 1849 06/11/16 0030 06/11/16 0618  TROPONINI 0.14* 0.14* 0.15* 0.15*   CBG:  Recent Labs Lab 06/13/16 1202 06/13/16 1639 06/13/16 2114 06/13/16 2345 06/14/16 0437  GLUCAP 105* 128* 131* 136* 129*   Urine analysis:    Component Value Date/Time   COLORURINE YELLOW 06/10/2016 1211   APPEARANCEUR HAZY* 06/10/2016 1211   LABSPEC 1.025 06/10/2016 1211   PHURINE 5.5 06/10/2016 1211   GLUCOSEU NEGATIVE 06/10/2016 1211   HGBUR LARGE* 06/10/2016 1211   BILIRUBINUR NEGATIVE 06/10/2016 1211   KETONESUR TRACE* 06/10/2016 1211   PROTEINUR 30* 06/10/2016 1211  UROBILINOGEN 0.2 01/29/2012 2204   NITRITE NEGATIVE 06/10/2016 1211   LEUKOCYTESUR MODERATE* 06/10/2016 1211   Sepsis Labs: (procalcitonin:4,lacticidven:4)  ) Recent Results (from the past 240 hour(s))  MRSA PCR Screening     Status: None   Collection Time: 06/10/16  6:42 PM  Result Value Ref Range Status   MRSA by PCR NEGATIVE NEGATIVE Final    Comment:        The GeneXpert MRSA Assay (FDA approved for NASAL specimens only), is one component of a comprehensive MRSA colonization surveillance program. It is not intended to diagnose MRSA infection nor to guide or monitor treatment for MRSA infections.   Culture, blood (routine x 2)     Status: None (Preliminary result)   Collection Time: 06/11/16  3:41 PM  Result Value Ref Range Status   Specimen Description BLOOD LEFT WRIST  Final   Special Requests BOTTLES DRAWN AEROBIC AND ANAEROBIC  8CC EACH  Final   Culture NO GROWTH 2 DAYS  Final   Report Status PENDING  Incomplete  Culture, blood (routine x 2)     Status: None (Preliminary result)   Collection Time: 06/11/16  3:44 PM  Result Value Ref Range Status   Specimen Description BLOOD PICC LINE DRAW  Final   Special Requests BOTTLES DRAWN AEROBIC AND ANAEROBIC 6CC EACH  Final   Culture NO GROWTH 2 DAYS  Final   Report Status PENDING  Incomplete   Scheduled Meds: . aspirin EC  81 mg Oral QPM  . atorvastatin  40 mg Oral q1800  . bisacodyl  10 mg Rectal BID  .  ceFAZolin (ANCEF) IV  1 g Intravenous Q8H  . dutasteride  0.5 mg Oral QPM  . enoxaparin (LOVENOX) injection  40 mg Subcutaneous Q24H  . gabapentin  100 mg Oral Daily  . insulin aspart  0-15 Units Subcutaneous Q4H  . mirabegron ER  50 mg Oral Daily  . pindolol  5 mg Oral Daily  . sodium chloride flush  3 mL Intravenous Q12H   Continuous Infusions: . dextrose 5 % and 0.45% NaCl 10 mL/hr at 06/14/16 0110   Time spent: 25 minutes  Erick Blinks, MD [facility] Pager [phone]  If 7PM-7AM, please contact night-coverage [email or web address] Password [facility] 06/14/2016, 6:52 AM

## 2016-06-15 DIAGNOSIS — G9340 Encephalopathy, unspecified: Secondary | ICD-10-CM

## 2016-06-15 DIAGNOSIS — R7989 Other specified abnormal findings of blood chemistry: Secondary | ICD-10-CM

## 2016-06-15 DIAGNOSIS — E118 Type 2 diabetes mellitus with unspecified complications: Secondary | ICD-10-CM

## 2016-06-15 DIAGNOSIS — B49 Unspecified mycosis: Secondary | ICD-10-CM

## 2016-06-15 DIAGNOSIS — Z7189 Other specified counseling: Secondary | ICD-10-CM

## 2016-06-15 DIAGNOSIS — I482 Chronic atrial fibrillation: Secondary | ICD-10-CM

## 2016-06-15 DIAGNOSIS — Z515 Encounter for palliative care: Secondary | ICD-10-CM

## 2016-06-15 LAB — GLUCOSE, CAPILLARY
GLUCOSE-CAPILLARY: 104 mg/dL — AB (ref 65–99)
GLUCOSE-CAPILLARY: 117 mg/dL — AB (ref 65–99)
GLUCOSE-CAPILLARY: 142 mg/dL — AB (ref 65–99)
Glucose-Capillary: 133 mg/dL — ABNORMAL HIGH (ref 65–99)
Glucose-Capillary: 130 mg/dL — ABNORMAL HIGH (ref 65–99)

## 2016-06-15 MED ORDER — ALPRAZOLAM 0.5 MG PO TABS
0.5000 mg | ORAL_TABLET | Freq: Two times a day (BID) | ORAL | Status: DC
  Administered 2016-06-15 – 2016-06-16 (×2): 0.5 mg via ORAL
  Filled 2016-06-15 (×4): qty 1

## 2016-06-15 MED ORDER — MORPHINE SULFATE (CONCENTRATE) 10 MG/0.5ML PO SOLN
2.5000 mg | ORAL | Status: DC
  Administered 2016-06-15 – 2016-06-16 (×6): 2.6 mg via SUBLINGUAL
  Filled 2016-06-15 (×7): qty 0.5

## 2016-06-15 NOTE — Progress Notes (Signed)
Daily Progress Note   Patient Name: [name]       Date: 06/15/2016 DOB: February 25, 1918  Age: 82 y.o. MRN#: [id number] Attending Physician: Jerald KiefStephen K Chiu, MD Primary Care Physician: [name], MD Admit Date: 06/10/2016  Reason for Consultation/Follow-up: Disposition, Inpatient hospice referral, Non pain symptom management, Psychosocial/spiritual support and Withdrawal of life-sustaining treatment  Subjective: [name] is resting quietly in bed, he does not make eye contact or greet me as I enter. He is focused on removing his mittens, and is confused.   He has no specific complaints at this time, but is clearly unable to make decisions.  Call to daughter [name], we talk about his current health problems and her desire to focus on comfort only at this time. We talk about transition to hospice home. She states that [facility] so that [name]'s church family will be able to visit him easily. We talk about untethering him from unnecessary medicines, and [name] is in agreement. She states that her aunt has hospice services at [facility], and she talks about hospice services for her mother. I share that when [name] [facility], he will spend his last days they are. [name] states she feels this is best for him.  I share that he will be treated with medications for pain and anxiety, and that he will be offered food and drink.  Length of Stay: 4  Current Medications: Scheduled Meds:  . dutasteride  0.5 mg Oral QPM  . gabapentin  100 mg Oral Daily  . insulin aspart  0-15 Units Subcutaneous Q4H  . sodium chloride flush  3 mL Intravenous Q12H    Continuous Infusions: . dextrose 5 % and 0.45% NaCl 10  mL/hr at 06/14/16 1215    PRN Meds: acetaminophen **OR** acetaminophen, albuterol, bisacodyl, LORazepam, morphine injection, polyethylene glycol, promethazine  Physical Exam          Vital Signs: BP 109/66 mmHg  Pulse 87  Temp(Src) 98.1 F (36.7 C) (Axillary)  Resp 18  Ht 5' 7.5" (1.715 m)  Wt 86.183 kg (190 lb)  BMI 29.30 kg/m2  SpO2 97% SpO2: SpO2: 97 % O2 Device: O2 Device: Not Delivered O2 Flow Rate: O2 Flow Rate (L/min): 2 L/min  Intake/output  summary:  Intake/Output Summary (Last 24 hours) at 06/15/16 1615 Last data filed at 06/15/16 1300  Gross per 24 hour  Intake      0 ml  Output   1750 ml  Net  -1750 ml   LBM: Last BM Date:  (unable to assess) Baseline Weight: Weight: 86.183 kg (190 lb) Most recent weight: Weight: 86.183 kg (190 lb)       Palliative Assessment/Data:    Flowsheet Rows        Most Recent Value   Intake Tab    Referral Department  Hospitalist   Unit at Time of Referral  Med/Surg Unit   Palliative Care Primary Diagnosis  Sepsis/Infectious Disease   Date Notified  06/14/16   Palliative Care Type  Return patient Palliative Care   Reason for referral  Clarify Goals of Care, Counsel Regarding Hospice   Date of Admission  06/10/16   Date first seen by Palliative Care  06/15/16   # of days Palliative referral response time  1 Day(s)   # of days IP prior to Palliative referral  4   Clinical Assessment    Psychosocial & Spiritual Assessment    Palliative Care Outcomes       Patient Active Problem List   Diagnosis Date Noted  . Fungemia 06/14/2016  . Anemia 06/12/2016  . Ileus (HCC) 06/10/2016  . Elevated troponin I level 06/10/2016  . UTI (lower urinary tract infection) 06/10/2016  . Encephalopathy acute 06/10/2016  . Palliative care encounter   . DNR (do not resuscitate) discussion   . Goals of care, counseling/discussion   . CKD (chronic kidney disease) stage 4, GFR 15-29 ml/min (HCC) 05/21/2016  . HTN (hypertension) 05/21/2016  .  Dyslipidemia 05/21/2016  . Normocytic anemia 05/21/2016  . Pressure ulcer 05/20/2016  . Thrombocytopenia (HCC) 05/20/2016  . Acute respiratory failure with hypoxia (HCC) 05/20/2016  . Elevated troponin 05/20/2016  . Staphylococcus aureus bacteremia with sepsis (HCC) 05/20/2016  . Kidney stone 03/22/2016  . Hydroureteronephrosis 03/22/2016  . Hyperkalemia 03/22/2016  . Chronic diastolic CHF (congestive heart failure) (HCC) 03/22/2016  . Barrett's esophagus 10/10/2013  . GERD (gastroesophageal reflux disease) 10/10/2013  . Dysphagia, unspecified(787.20) 08/13/2013  . SPINAL STENOSIS 02/15/2010  . CORONARY ATHEROSCLEROSIS NATIVE CORONARY ARTERY 10/31/2008  . Chronic atrial fibrillation (HCC) 10/31/2008  . SCIATICA 07/28/2008  . KNEE, ARTHRITIS, DEGEN./OSTEO 06/11/2008  . DERANGEMENT MENISCUS 06/11/2008  . KNEE PAIN, RIGHT 06/11/2008  . Diabetes mellitus type 2 with complications (HCC) 06/10/2008    Palliative Care Assessment & Plan   Patient Profile: 82 y.o. male with past medical history of CAD, mixed hyperlipidemia, type II diabetes, a field, hypertension, sleep apnea, right ureteral stent placed April 2017 admitted on 05/19/2016 with septic shock. He has been readmitted on 7/14 with UTI, yeast in his blood culture.   Assessment: As above  Recommendations/Plan: Daughter Kyle Elm requests  Hospice home of [facility], full comfort measures at this time.    Goals of Care and Additional Recommendations:  Limitations on Scope of Treatment: Full Comfort Care  Code Status:    Code Status Orders        Start     Ordered   06/10/16 1838  Do not attempt resuscitation (DNR)   Continuous    Question Answer Comment  In the event of cardiac or respiratory ARREST Do not call a "code blue"   In the event of cardiac or respiratory ARREST Do not perform Intubation, CPR, defibrillation or ACLS   In  the event of cardiac or respiratory ARREST Use medication by any route, position,  wound care, and other measures to relive pain and suffering. May use oxygen, suction and manual treatment of airway obstruction as needed for comfort.      06/10/16 1837    Code Status History    Date Active Date Inactive Code Status Order ID Comments User Context   05/22/2016 12:24 PM 05/24/2016  7:32 PM DNR [id number]  Elliot Cousin, MD Inpatient   05/20/2016  5:09 AM 05/22/2016 12:24 PM Full Code [id number]  Houston Siren, MD Inpatient   03/22/2016  9:38 AM 03/24/2016  5:27 PM DNR [id number]  Erick Blinks, MD Inpatient    Advance Directive Documentation        Most Recent Value   Type of Advance Directive  Healthcare Power of Attorney   Pre-existing out of facility DNR order (yellow form or pink MOST form)     "MOST" Form in Place?         Prognosis:   < 2 weeks, likely based on Fungemia in blood, UTI, and Daughters desire to focus on comfort, not cure.   Discharge Planning:  Daughter Kyle Elm is requesting inpatient hospice care at hospice of [facility].  Care plan was discussed with nursing staff, case manager, social worker, and Dr. Rhona Leavens.   Thank you for allowing the Palliative Medicine Team to assist in the care of this patient.   Time In: 1625 Time Out: 1650 Total Time 25 minutes  Prolonged Time Billed  no       Greater than 50%  of this time was spent counseling and coordinating care related to the above assessment and plan.  Dove,Tasha A, NP  Please contact Palliative Medicine Team phone at [phone] for questions and concerns.

## 2016-06-15 NOTE — Progress Notes (Signed)
SLP Cancellation Note  Patient Details Name: [name] MRN: [id number] DOB: February 06, 1918   Cancelled treatment:       Reason Eval/Treat Not Completed: Fatigue/lethargy limiting ability to participate; Pt unable to sustain alertness for po trials.  Thank you,  Havery MorosDabney Porter, CCC-SLP [phone]    PORTER,DABNEY 06/15/2016, 2:11 PM

## 2016-06-15 NOTE — Progress Notes (Signed)
PROGRESS NOTE    Kyle DON  [name] DOB: 07-13-1918 DOA: 06/10/2016 PCP: Dwana Melena, MD  Outpatient Specialists:   Brief Narrative:  63 yom with a hx of CAD s/p CABG, DM type 2, recent MSSA bacteremia on Ancef until 7/21 and afib presented with complaints of emesis. While in the ED, he was noted to have UTI, with associated confusion and agitation. He was subsequently admitted for further management of acute encephalopathy secondary to UTI. No noticeable improvement in patient's mental status was noted after initially treating him for his underlying UTI. His po intake has been poor. Urine culture and blood culture positive for yeast. After discussing with daughter, she wants patient to be made comfort care. Palliative care consulted. Anticipate discharge to residential hospice after palliative meets with family .    Assessment & Plan:   Principal Problem:   Encephalopathy acute Active Problems:   Diabetes mellitus type 2 with complications (HCC)   Chronic atrial fibrillation (HCC)   Ileus (HCC)   Elevated troponin I level   UTI (lower urinary tract infection)   Anemia   Fungemia  1. Acute encephalopathy. Likely multifactorial, related to acute illness/infection. ABG unremarkable. Appear stable at this time 2. UTI. Patient is currently on Ancef. Urine culture positive for Yeast. On CT scan he has evidence of indwelling uretral stent and non-obstructing uretral calculi. No evidence of stranding or uretral obstruction.  3. Fungemia. Patient has positive blood culture (1 out of 2) for yeast. Dr. Kerry Hough had discussed with patient's condition with patient's daughter who has elected to pursue comfort care, so did not start on antifungals.   4. Ileus. Nausea and vomiting likely due to ileus. But appears to have resolved. Patient now having bowel movements with suppositories. No further vomiting. He was placed on NPO except medication per ST due to moderate aspiration risk.   5. Leukocytosis. Improving. Etiology possibly related to UTI. Patient was receiving Ancef prior to admission for MSSA bacteremia.   6. Microcytic anemia. No evidence of gross bleeding. Likely related to iron deficiency. Fecal occult blood test positive on admission, but no evidence of gross bleeding, anemia profile shows iron level of 19.Would not pursue invasive work up due to patient's advanced age and co morbidities 7. Recent MSSA bacteremia. Patient was discharged on 6/27 after being evaluated for MSSA bacteremia. He was discharged on a course of Ancef to be completed on 7/21. 8. Elevated troponin I level. Likely demand ischemia. 9. Chronic atrial fib. Remains stable. CHADSVASC 4. Patient not chronically anticoagulated due to fall risk. 10. Acute on Chronic diastolic CHF. Past EF is 55-60%. Patient is already on a beta blocker. Patient developed worsening shortness of breath overnight and CXR showed signs of decompensated CHF. He was given lasix this admission with good urine output. Patient seems to be breathing comfortably at this time.  11. DM type 2. Continue SSI.  12. End-of-life. With patient's advanced age, multiple co morbidities, poor po intake and now evidence of fungemia, Dr.Memon had a discussion with patient's daughter who agrees to change patient to comfort care. Antibiotics were subsequently stopped and a medication regimen were simplified to include only those meds that are directly related to comfort. Palliative care has been consulted. Suspect patient may be appropriate for residential hospice. We'll follow-up with palliative care recommendations.   DVT prophylaxis: Lovenox Code Status: DNR Family Communication:  Patient in room, family not at bedside Disposition Plan: Possible residential hospice, disposition per palliative care   Consultants:  ST- NPO except meds   Palliative care  Procedures:   none  Antimicrobials:   Rocephin  7/14 >> 7/15  Ancef Prior to  admission >> 7/21   Subjective: Patient asleep, appears comfortable  Objective: Filed Vitals:   06/14/16 1346 06/14/16 2100 06/15/16 0658 06/15/16 1421  BP:  115/49 112/75 109/66  Pulse:  86 99 87  Temp:  98.4 F (36.9 C) 98 F (36.7 C) 98.1 F (36.7 C)  TempSrc:  Axillary Axillary   Resp:  20 18 18   Height:      Weight:      SpO2: 97% 100% 97% 97%    Intake/Output Summary (Last 24 hours) at 06/15/16 1718 Last data filed at 06/15/16 1300  Gross per 24 hour  Intake      0 ml  Output   1750 ml  Net  -1750 ml   Filed Weights   06/10/16 1115  Weight: 86.183 kg (190 lb)   Examination:  General exam: Sleeping, appears comfortable Respiratory system: Crackles at bases. Respiratory effort normal. Cardiovascular system: S1 & S2 heard, RRR.  Gastrointestinal system: Abdomen is nondistended, soft and nontender. No organomegaly or masses felt. Normal bowel sounds heard. Central nervous system: cannot assess due to mental status Extremities: Symmetric 5 x 5 power. Skin: No rashes, lesions Psychiatry: cannot assess due to mental state   Data Reviewed: I have personally reviewed following labs and imaging studies  CBC:  Recent Labs Lab 06/10/16 1159 06/11/16 0618 06/12/16 0656 06/13/16 0654 06/14/16 0520  WBC 20.1* 20.0* 13.1* 13.5* 11.9*  NEUTROABS 16.8*  --   --   --   --   HGB 9.3* 7.9* 7.4* 7.8* 8.5*  HCT 29.2* 23.9* 23.1* 24.8* 26.7*  MCV 98.3 98.4 100.4* 100.0 99.3  PLT 317 253 210 238 246   Basic Metabolic Panel:  Recent Labs Lab 06/10/16 1159 06/11/16 0618 06/12/16 0656 06/13/16 0654 06/14/16 0520  NA 133* 137 138 141 140  K 4.0 3.8 3.8 4.0 3.3*  CL 103 107 110 112* 108  CO2 23 22 21* 21* 24  GLUCOSE 237* 96 113* 107* 136*  BUN 24* 30* 28* 24* 22*  CREATININE 1.23 1.39* 1.35* 1.21 1.21  CALCIUM 7.5* 7.4* 7.5* 7.8* 7.6*   GFR: Estimated Creatinine Clearance: 37.8 mL/min (by C-G formula based on Cr of 1.21). Liver Function Tests:  Recent  Labs Lab 06/10/16 1159  AST 30  ALT 8*  ALKPHOS 77  BILITOT 0.5  PROT 5.4*  ALBUMIN 2.2*    Recent Labs Lab 06/10/16 1159  LIPASE 21   Cardiac Enzymes:  Recent Labs Lab 06/10/16 1159 06/10/16 1849 06/11/16 0030 06/11/16 0618  TROPONINI 0.14* 0.14* 0.15* 0.15*   CBG:  Recent Labs Lab 06/15/16 0035 06/15/16 0334 06/15/16 0745 06/15/16 1139 06/15/16 1640  GLUCAP 142* 133* 130* 104* 117*   Urine analysis:    Component Value Date/Time   COLORURINE YELLOW 06/10/2016 1211   APPEARANCEUR HAZY* 06/10/2016 1211   LABSPEC 1.025 06/10/2016 1211   PHURINE 5.5 06/10/2016 1211   GLUCOSEU NEGATIVE 06/10/2016 1211   HGBUR LARGE* 06/10/2016 1211   BILIRUBINUR NEGATIVE 06/10/2016 1211   KETONESUR TRACE* 06/10/2016 1211   PROTEINUR 30* 06/10/2016 1211   UROBILINOGEN 0.2 01/29/2012 2204   NITRITE NEGATIVE 06/10/2016 1211   LEUKOCYTESUR MODERATE* 06/10/2016 1211   Sepsis Labs: @LABRCNTIP (procalcitonin:4,lacticidven:4)  ) Recent Results (from the past 240 hour(s))  Urine culture     Status: Abnormal   Collection Time: 06/10/16 12:11 PM  Result Value Ref Range Status   Specimen Description URINE, CLEAN CATCH  Final   Special Requests NONE  Final   Culture >=100,000 COLONIES/mL YEAST (A)  Final   Report Status 06/14/2016 FINAL  Final  MRSA PCR Screening     Status: None   Collection Time: 06/10/16  6:42 PM  Result Value Ref Range Status   MRSA by PCR NEGATIVE NEGATIVE Final    Comment:        The GeneXpert MRSA Assay (FDA approved for NASAL specimens only), is one component of a comprehensive MRSA colonization surveillance program. It is not intended to diagnose MRSA infection nor to guide or monitor treatment for MRSA infections.   Culture, blood (routine x 2)     Status: None (Preliminary result)   Collection Time: 06/11/16  3:41 PM  Result Value Ref Range Status   Specimen Description BLOOD LEFT WRIST  Final   Special Requests BOTTLES DRAWN AEROBIC AND  ANAEROBIC 8CC EACH  Final   Culture NO GROWTH 4 DAYS  Final   Report Status PENDING  Incomplete  Culture, blood (routine x 2)     Status: Abnormal (Preliminary result)   Collection Time: 06/11/16  3:44 PM  Result Value Ref Range Status   Specimen Description BLOOD PICC LINE DRAWN BY RN  Final   Special Requests BOTTLES DRAWN AEROBIC AND ANAEROBIC 6CC EACH  Final   Culture  Setup Time   Final    YEAST AEROBIC BOTTLE ONLY Gram Stain Report Called to,Read Back By and Verified With: KNIGHT,C. AT 1430 ON 06/14/2016 BY BAUGHAM,M. Performed at [facility] CRITICAL RESULT CALLED TO, READ BACK BY AND VERIFIED WITHArliss Journey: C KNIGHT RN 1809 06/14/16 A BROWNING [facility]    Culture CANDIDA GLABRATA (A)  Final   Report Status PENDING  Incomplete  Blood Culture ID Panel (Reflexed)     Status: Abnormal   Collection Time: 06/11/16  3:44 PM  Result Value Ref Range Status   Enterococcus species NOT DETECTED NOT DETECTED Final   Vancomycin resistance NOT DETECTED NOT DETECTED Final   Listeria monocytogenes NOT DETECTED NOT DETECTED Final   Staphylococcus species NOT DETECTED NOT DETECTED Final   Staphylococcus aureus NOT DETECTED NOT DETECTED Final   Methicillin resistance NOT DETECTED NOT DETECTED Final   Streptococcus species NOT DETECTED NOT DETECTED Final   Streptococcus agalactiae NOT DETECTED NOT DETECTED Final   Streptococcus pneumoniae NOT DETECTED NOT DETECTED Final   Streptococcus pyogenes NOT DETECTED NOT DETECTED Final   Acinetobacter baumannii NOT DETECTED NOT DETECTED Final   Enterobacteriaceae species NOT DETECTED NOT DETECTED Final   Enterobacter cloacae complex NOT DETECTED NOT DETECTED Final   Escherichia coli NOT DETECTED NOT DETECTED Final   Klebsiella oxytoca NOT DETECTED NOT DETECTED Final   Klebsiella pneumoniae NOT DETECTED NOT DETECTED Final   Proteus species NOT DETECTED NOT DETECTED Final   Serratia marcescens NOT DETECTED NOT DETECTED Final    Carbapenem resistance NOT DETECTED NOT DETECTED Final   Haemophilus influenzae NOT DETECTED NOT DETECTED Final   Neisseria meningitidis NOT DETECTED NOT DETECTED Final   Pseudomonas aeruginosa NOT DETECTED NOT DETECTED Final   Candida albicans NOT DETECTED NOT DETECTED Final   Candida glabrata DETECTED (A) NOT DETECTED Final    Comment: CRITICAL RESULT CALLED TO, READ BACK BY AND VERIFIED WITH: C KNIGHT RN 1809 06/14/16 A BROWNING    Candida krusei NOT DETECTED NOT DETECTED Final   Candida parapsilosis NOT DETECTED NOT DETECTED Final  Candida tropicalis NOT DETECTED NOT DETECTED Final    Comment: Performed at [facility]   Scheduled Meds: . ALPRAZolam  0.5 mg Oral BID  . dutasteride  0.5 mg Oral QPM  . gabapentin  100 mg Oral Daily  . morphine CONCENTRATE  2.6 mg Sublingual Q4H  . sodium chloride flush  3 mL Intravenous Q12H   Continuous Infusions: . dextrose 5 % and 0.45% NaCl 10 mL/hr at 06/14/16 1215   Time spent: 25 minutes  Erick Blinks, MD [facility] Pager [phone]  If 7PM-7AM, please contact night-coverage [email or web address] Password [email or web address] 06/15/2016, 5:18 PM

## 2016-06-15 NOTE — NC FL2 (Signed)
[place] MEDICAID FL2 LEVEL OF CARE SCREENING TOOL     IDENTIFICATION  Patient Name: [name] Birthdate: [birth year] Sex: male Admission Date (Current Location): 06/10/2016  [facility] and [place] Number:  [employer] and Address:  [facility],  [address] [place] [address]      Provider Number: [id number]  Attending Physician Name and Address:  Jerald Kief, MD  Relative Name and Phone Number:       Current Level of Care: Hospital Recommended Level of Care: Skilled Nursing Facility Prior Approval Number:    Date Approved/Denied:   PASRR Number:    Discharge Plan: SNF    Current Diagnoses: Patient Active Problem List   Diagnosis Date Noted  . Fungemia 06/14/2016  . Anemia 06/12/2016  . Ileus (HCC) 06/10/2016  . Elevated troponin I level 06/10/2016  . UTI (lower urinary tract infection) 06/10/2016  . Encephalopathy acute 06/10/2016  . Palliative care encounter   . DNR (do not resuscitate) discussion   . Goals of care, counseling/discussion   . CKD (chronic kidney disease) stage 4, GFR 15-29 ml/min (HCC) 05/21/2016  . HTN (hypertension) 05/21/2016  . Dyslipidemia 05/21/2016  . Normocytic anemia 05/21/2016  . Pressure ulcer 05/20/2016  . Thrombocytopenia (HCC) 05/20/2016  . Acute respiratory failure with hypoxia (HCC) 05/20/2016  . Elevated troponin 05/20/2016  . Staphylococcus aureus bacteremia with sepsis (HCC) 05/20/2016  . Kidney stone 03/22/2016  . Hydroureteronephrosis 03/22/2016  . Hyperkalemia 03/22/2016  . Chronic diastolic CHF (congestive heart failure) (HCC) 03/22/2016  . Barrett's esophagus 10/10/2013  . GERD (gastroesophageal reflux disease) 10/10/2013  . Dysphagia, unspecified(787.20) 08/13/2013  . SPINAL STENOSIS 02/15/2010  . CORONARY ATHEROSCLEROSIS NATIVE CORONARY ARTERY 10/31/2008  . Chronic atrial fibrillation (HCC) 10/31/2008  . SCIATICA 07/28/2008  . KNEE, ARTHRITIS, DEGEN./OSTEO 06/11/2008  .  DERANGEMENT MENISCUS 06/11/2008  . KNEE PAIN, RIGHT 06/11/2008  . Diabetes mellitus type 2 with complications (HCC) 06/10/2008    Orientation RESPIRATION BLADDER Height & Weight        Normal Indwelling catheter Weight: 190 lb (86.183 kg) Height:  5' 7.5" (171.5 cm)  BEHAVIORAL SYMPTOMS/MOOD NEUROLOGICAL BOWEL NUTRITION STATUS      Incontinent Diet (see discharge summary)  AMBULATORY STATUS COMMUNICATION OF NEEDS Skin   Extensive Assist Verbally PU Stage and Appropriate Care (Stage II buttocks. Foam dressing PRN)                       Personal Care Assistance Level of Assistance  Bathing, Feeding, Dressing Bathing Assistance: Maximum assistance Feeding assistance: Limited assistance Dressing Assistance: Maximum assistance     Functional Limitations Info  Sight, Hearing, Speech Sight Info: Adequate Hearing Info: Adequate Speech Info: Adequate    SPECIAL CARE FACTORS FREQUENCY                       Contractures      Additional Factors Info  Insulin Sliding Scale, Psychotropic, Allergies, Code Status Code Status Info: Full Code Allergies Info: Acetaminophen-codeine, Advil, Penicillins Psychotropic Info: Neurontin         Current Medications (06/15/2016):  This is the current hospital active medication list Current Facility-Administered Medications  Medication Dose Route Frequency Provider Last Rate Last Dose  . acetaminophen (TYLENOL) tablet 650 mg  650 mg Oral Q6H PRN Levie Heritage, DO       Or  . acetaminophen (TYLENOL) suppository 650 mg  650 mg Rectal Q6H PRN Levie Heritage, DO      .  albuterol (PROVENTIL) (2.5 MG/3ML) 0.083% nebulizer solution 2.5 mg  2.5 mg Nebulization Q4H PRN Leda GauzeKaren J Kirby-Graham, NP   2.5 mg at 06/14/16 0037  . bisacodyl (DULCOLAX) suppository 10 mg  10 mg Rectal Daily PRN Erick BlinksJehanzeb Memon, MD      . dextrose 5 %-0.45 % sodium chloride infusion   Intravenous Continuous Jonah BlueJennifer Yates, MD 10 mL/hr at 06/14/16 1215    . dutasteride  (AVODART) capsule 0.5 mg  0.5 mg Oral QPM Rhona RaiderJacob J Stinson, DO   0.5 mg at 06/12/16 1911  . gabapentin (NEURONTIN) capsule 100 mg  100 mg Oral Daily Rhona RaiderJacob J Stinson, DO   100 mg at 06/12/16 1911  . insulin aspart (novoLOG) injection 0-15 Units  0-15 Units Subcutaneous Q4H Jinger NeighborsMary A Lynch, NP   2 Units at 06/15/16 0950  . LORazepam (ATIVAN) injection 0.5 mg  0.5 mg Intravenous Q4H PRN Rhona RaiderJacob J Stinson, DO   0.5 mg at 06/14/16 1041  . morphine 2 MG/ML injection 2 mg  2 mg Intravenous Q1H PRN Jonah BlueJennifer Yates, MD   2 mg at 06/15/16 0730  . polyethylene glycol (MIRALAX / GLYCOLAX) packet 17 g  17 g Oral Daily PRN Rhona RaiderJacob J Stinson, DO   17 g at 06/11/16 1621  . promethazine (PHENERGAN) tablet 12.5 mg  12.5 mg Oral Q6H PRN Rhona RaiderJacob J Stinson, DO      . sodium chloride flush (NS) 0.9 % injection 3 mL  3 mL Intravenous Q12H Rhona RaiderJacob J Stinson, DO   3 mL at 06/15/16 1000     Discharge Medications: Please see discharge summary for a list of discharge medications.  Relevant Imaging Results:  Relevant Lab Results:   Additional Information SSN [id number]  Annice NeedySettle, Lynel Forester D, LCSW

## 2016-06-15 NOTE — Care Management Important Message (Signed)
Important Message  Patient Details  Name: [name] MRN: [id number] Date of Birth: 1918/08/25   Medicare Important Message Given:  Yes    Braysen Cloward, Chrystine OilerSharley Diane, RN 06/15/2016, 1:11 PM

## 2016-06-16 LAB — CULTURE, BLOOD (ROUTINE X 2): Culture: NO GROWTH

## 2016-06-16 MED ORDER — BISACODYL 10 MG RE SUPP: 10.0000 mg | Freq: Every day | RECTAL | Status: AC | PRN

## 2016-06-16 MED ORDER — ALBUTEROL SULFATE (2.5 MG/3ML) 0.083% IN NEBU: 2.5000 mg | INHALATION_SOLUTION | RESPIRATORY_TRACT | Status: AC | PRN

## 2016-06-16 MED ORDER — MORPHINE SULFATE (CONCENTRATE) 10 MG/0.5ML PO SOLN: 2.5000 mg | ORAL | Status: AC

## 2016-06-16 MED ORDER — ALPRAZOLAM 0.5 MG PO TABS: 0.5000 mg | ORAL_TABLET | Freq: Two times a day (BID) | ORAL | Status: AC

## 2016-06-16 MED ORDER — PROMETHAZINE HCL 12.5 MG PO TABS: 12.5000 mg | ORAL_TABLET | Freq: Four times a day (QID) | ORAL | Status: AC | PRN

## 2016-06-16 NOTE — Discharge Summary (Addendum)
Physician Discharge Summary  [name] ZOX:[id number] DOB: [birth year] DOA: 06/10/2016  PCP: Dwana Melena, MD  Admit date: 06/10/2016 Discharge date: 06/16/2016  Disposition:  Residential Hospice  Recommendations for Outpatient Follow-up:  1. Follow up with PCP on as needed basis 2. Hospice care per staff  Discharge Condition:Stable CODE STATUS: Comfort care Diet recommendation: dysphagia 3 with thin liquids  Brief/Interim Summary: 82 yom with a hx of CAD s/p CABG, DM type 2, recent MSSA bacteremia on Ancef until 7/21 and afib presented with complaints of emesis. While in the ED, he was noted to have UTI, with associated confusion and agitation. He was subsequently admitted for further management of acute encephalopathy secondary to UTI. No noticeable improvement in patient's mental status was noted after initially treating him for his underlying UTI. His po intake has been poor. Urine culture and blood culture positive for yeast. After discussing with daughter, she wants patient to be made comfort care. Palliative care consulted. Anticipate discharge to residential hospice after palliative meets with family .   1. Acute encephalopathy. Likely multifactorial, related to acute illness/infection. ABG unremarkable. Appears stable at this time 2. UTI undetermined if related to ureteral stent. Patient is currently on Ancef. Urine culture positive for Yeast. On CT scan he has evidence of indwelling uretral stent and non-obstructing uretral calculi. No evidence of stranding or uretral obstruction.  3. Fungemia. Patient has positive blood culture (1 out of 2) for yeast. Dr. Kerry Hough had discussed with patient's condition with patient's daughter who has elected to pursue comfort care, so did not start on antifungals.  4. Ileus. Nausea and vomiting likely due to ileus. But appears to have resolved. Patient now having bowel movements with suppositories. No further vomiting. He was initially placed on NPO  except medication per ST due to moderate aspiration risk. Now on dysphagia diet 5. Leukocytosis. Improving. Etiology possibly related to UTI. Patient was receiving Ancef prior to admission for MSSA bacteremia.  6. Microcytic anemia. No evidence of gross bleeding. Likely related to iron deficiency. Fecal occult blood test positive on admission, but no evidence of gross bleeding, anemia profile shows iron level of 19.Would not pursue invasive work up due to patient's wishes for comfort care 7. Recent MSSA bacteremia. Patient was discharged on 6/27 after being evaluated for MSSA bacteremia. He was discharged on a course of Ancef to be completed on 7/21. 8. Elevated troponin I level. Likely demand ischemia. 9. Chronic atrial fib. Remains stable. CHADSVASC 4. Patient not chronically anticoagulated due to fall risk. 10. Acute on Chronic diastolic CHF. Past EF is 55-60%. Patient is already on a beta blocker. Patient developed worsening shortness of breath overnight and CXR showed signs of decompensated CHF. He was given lasix this admission with good urine output. Patient seems to be breathing comfortably at this time.  11. DM type 2. Continue SSI.  12. End-of-life. With 82 age, multiple co morbidities, poor po intake and now evidence of fungemia, Dr.Memon had a discussion with patient's daughter who agrees to change patient to comfort care. Antibiotics were subsequently stopped and a medication regimen were simplified to include only those meds that are directly related to comfort. Palliative care has been consulted. Appreciate input by Palliative Care. Plans for residential hospice  Discharge Diagnoses:  Principal Problem:   Encephalopathy acute Active Problems:   Diabetes mellitus type 2 with complications (HCC)   Chronic atrial fibrillation (HCC)   Ileus (HCC)   Elevated troponin I level   UTI (lower urinary tract  infection)   Anemia   Fungemia       Medication List     STOP taking these medications        aspirin EC 81 MG tablet     atorvastatin 40 MG tablet  Commonly known as:  LIPITOR     beta carotene w/minerals tablet     ceFAZolin 2-4 GM/100ML-% IVPB  Commonly known as:  ANCEF     cyclobenzaprine 5 MG tablet  Commonly known as:  FLEXERIL     DAILY VITE PO     diclofenac sodium 1 % Gel  Commonly known as:  VOLTAREN     dutasteride 0.5 MG capsule  Commonly known as:  AVODART     gabapentin 100 MG capsule  Commonly known as:  NEURONTIN     HYDROcodone-acetaminophen 5-325 MG tablet  Commonly known as:  NORCO/VICODIN     insulin aspart 100 UNIT/ML injection  Commonly known as:  novoLOG     MYRBETRIQ 50 MG Tb24 tablet  Generic drug:  mirabegron ER     ondansetron 4 MG tablet  Commonly known as:  ZOFRAN     oxyCODONE-acetaminophen 5-325 MG tablet  Commonly known as:  PERCOCET/ROXICET     pindolol 5 MG tablet  Commonly known as:  VISKEN     sitaGLIPtin 100 MG tablet  Commonly known as:  JANUVIA      TAKE these medications        albuterol (2.5 MG/3ML) 0.083% nebulizer solution  Commonly known as:  PROVENTIL  Take 3 mLs (2.5 mg total) by nebulization every 4 (four) hours as needed for wheezing or shortness of breath.     ALPRAZolam 0.5 MG tablet  Commonly known as:  XANAX  Take 1 tablet (0.5 mg total) by mouth 2 (two) times daily.     bisacodyl 10 MG suppository  Commonly known as:  DULCOLAX  Place 1 suppository (10 mg total) rectally daily as needed for moderate constipation.     morphine CONCENTRATE 10 MG/0.5ML Soln concentrated solution  Place 0.13 mLs (2.6 mg total) under the tongue every 4 (four) hours.     promethazine 12.5 MG tablet  Commonly known as:  PHENERGAN  Take 1 tablet (12.5 mg total) by mouth every 6 (six) hours as needed for nausea.           Follow-up Information    Follow up with Dwana Melena, MD.   Specialty:  Internal Medicine   Why:  As needed   Contact information:   [address] [place] [address] [phone]      Allergies  Allergen Reactions  . Acetaminophen-Codeine     Unknown reaction-not listed on current MAR  . Advil [Ibuprofen] Other (See Comments)    Unknown reaction  . Penicillins     Unknown reaction-not listed on current MAR    Consultations:  Palliative Care   Procedures/Studies: Ct Abdomen Pelvis Wo Contrast  06/10/2016  CLINICAL DATA:  Abdominal pain, vomiting starting yesterday EXAM: CT ABDOMEN AND PELVIS WITHOUT CONTRAST TECHNIQUE: Multidetector CT imaging of the abdomen and pelvis was performed following the standard protocol without IV contrast. COMPARISON:  05/20/2016 FINDINGS: Lower chest: There is small left pleural effusion. Small to moderate right pleural effusion. Bilateral lower lobe posterior atelectasis. Mitral valve calcifications are noted. Hepatobiliary: Unenhanced liver shows no biliary ductal dilatation. Status postcholecystectomy. Pancreas: Unenhanced pancreas is atrophic Spleen: Unenhanced spleen is unremarkable. Adrenals/Urinary Tract: No adrenal gland mass. Unenhanced kidneys are symmetrical in size.  Mild lobulated renal contour. A right ureteral stent in place. Tiny nonobstructive calculus midpole of the left kidney measures 2 mm. No hydronephrosis or hydroureter. The urinary bladder is empty decompressed. There is a Foley catheter within bladder. Stomach/Bowel: There is moderate gastric distension with gas and some fluid highly suspicious for gastric ileus. Moderate gaseous distension of the small bowel with few air-fluid levels. Some contrast material noted in mid small bowel loops. Findings suspicious for diffuse small bowel ileus, early small bowel obstruction or enteritis. Clinical correlation is necessary. No transition point in caliber of small bowel is noted. Abundant stool and some colonic gas noted within cecum. There is no pericecal inflammation. Normal appendix is partially visualized in axial image 54. No  colonic diverticula are noted proximal sigmoid colon. No evidence of acute diverticulitis. Vascular/Lymphatic: Atherosclerotic calcifications of abdominal aorta and iliac arteries. No aortic aneurysm. No retroperitoneal or mesenteric adenopathy. Reproductive: No pelvic mass.  No adenopathy. Other: No abdominal ascites or free abdominal air. There is a tiny umbilical hernia containing fat without evidence of acute complication. Musculoskeletal: No destructive bony lesions are noted. Degenerative changes are noted thoracolumbar spine. There is posterior fusion at L4-L5 level with alignment preserved. Disc space flattening with vacuum disc phenomenon noted at L1-L2 and L2-L3 level. IMPRESSION: 1. Small left pleural effusion. Small to moderate right pleural effusion. Bilateral lower lobe posterior atelectasis. 2. There is moderate gastric distension with gas and fluid. Highly suspicious for gastroparesis or gastric ileus. 3. Moderate gaseous distended small bowel loops with few air-fluid levels. Findings suspicious for ileus, early small bowel obstruction or diffuse enteritis. Clinical correlation is necessary. There is no transition point in caliber of small bowel. 4. Moderate stool and gas noted within cecum and right colon. No pericecal inflammation. Normal appendix is partially visualized. Some colonic gas noted within mid sigmoid colon. No distal colonic obstruction. Colonic diverticula are noted sigmoid colon without evidence of acute diverticulitis. 5. A right ureteral stent in place is noted. There is a Foley catheter within decompressed urinary bladder. 6. No evidence of free abdominal air. Electronically Signed   By: Natasha Mead M.D.   On: 06/10/2016 15:06   Dg Chest 1 View  05/22/2016  CLINICAL DATA:  PICC adjustment EXAM: CHEST 1 VIEW COMPARISON:  Chest radiograph from earlier today. FINDINGS: Right PICC terminates in the lower third of the superior vena cava. Sternotomy wires appear aligned and intact.  CABG clips overlie the mediastinum. Stable cardiomediastinal silhouette with mild cardiomegaly. No pneumothorax. No pleural effusion. No overt pulmonary edema. Mild bibasilar scarring versus atelectasis. IMPRESSION: 1. Right PICC terminates in the lower third of the superior vena cava. 2. Stable mild cardiomegaly without overt pulmonary edema. 3. Mild bibasilar scarring versus atelectasis. Electronically Signed   By: Delbert Phenix M.D.   On: 05/22/2016 13:50   Dg Chest 1 View  05/22/2016  CLINICAL DATA:  PICC line placement EXAM: CHEST 1 VIEW COMPARISON:  05/20/2016 FINDINGS: Cardiomegaly again noted. There is right arm PICC line with tip in right atrium. No pneumothorax. Mild perihilar interstitial prominence suspicious for mild interstitial edema. No segmental infiltrate. Status post CABG free IMPRESSION: Right arm PICC line with tip in right atrium. No pneumothorax. Mild perihilar interstitial prominence suspicious for mild interstitial edema. Electronically Signed   By: Natasha Mead M.D.   On: 05/22/2016 12:06   Ct Head Wo Contrast  05/20/2016  CLINICAL DATA:  Acute onset of altered mental status. Status post syncope. Patient unresponsive. Fever and vomiting. Initial encounter.  EXAM: CT HEAD WITHOUT CONTRAST CT CERVICAL SPINE WITHOUT CONTRAST TECHNIQUE: Multidetector CT imaging of the head and cervical spine was performed following the standard protocol without intravenous contrast. Multiplanar CT image reconstructions of the cervical spine were also generated. COMPARISON:  CT of the head performed 09/28/2014 FINDINGS: CT HEAD FINDINGS There is no evidence of acute infarction, mass lesion, or intra- or extra-axial hemorrhage on CT. Prominence of ventricles and sulci reflects moderate cortical volume loss. Mild cerebellar atrophy is noted. Scattered periventricular and subcortical white matter change likely reflects small vessel ischemic microangiopathy. A chronic infarct is noted at the medial left occipital  lobe, with associated encephalomalacia. The brainstem and fourth ventricle are within normal limits. The basal ganglia are unremarkable in appearance. No mass effect or midline shift is seen. There is no evidence of fracture; visualized osseous structures are unremarkable in appearance. The orbits are within normal limits. There is mild partial opacification of the right mastoid air cells. The paranasal sinuses and left mastoid air cells are well-aerated. No significant soft tissue abnormalities are seen. CT CERVICAL SPINE FINDINGS There is no evidence of acute fracture or subluxation. Vertebral bodies demonstrate normal height. There is minimal grade 1 anterolisthesis of C3 on C4, and minimal grade 1 anterolisthesis of C6 on C7. Multilevel disc space narrowing is noted along the mid cervical spine, with anterior and posterior disc osteophyte complexes. Underlying facet disease is noted along the cervical spine. Prevertebral soft tissues are within normal limits. A 4.2 cm hypodensity is suggested at the left thyroid lobe. Mild interstitial prominence is noted at the lung apices. Scattered calcification is seen at the carotid bifurcations bilaterally. IMPRESSION: 1. No evidence of traumatic intracranial injury or fracture. 2. No evidence of acute fracture or subluxation along the cervical spine. 3. Moderate cortical volume loss and scattered small vessel ischemic microangiopathy. 4. 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 5. Degenerative change along the cervical spine. 6. 4.2 cm hypodensity suggested at the left thyroid lobe. Consider further evaluation with thyroid ultrasound, as deemed clinically appropriate. If patient is clinically hyperthyroid, consider nuclear medicine thyroid uptake and scan. 7. Mild interstitial prominence at the lung apices. 8. Scattered calcification at the carotid bifurcations bilaterally. Carotid ultrasound would be helpful for further  evaluation, when and as deemed clinically appropriate. 9. Mild partial opacification of the right mastoid air cells. Electronically Signed   By: Roanna Raider M.D.   On: 05/20/2016 02:18   Ct Cervical Spine Wo Contrast  05/20/2016  CLINICAL DATA:  Acute onset of altered mental status. Status post syncope. Patient unresponsive. Fever and vomiting. Initial encounter. EXAM: CT HEAD WITHOUT CONTRAST CT CERVICAL SPINE WITHOUT CONTRAST TECHNIQUE: Multidetector CT imaging of the head and cervical spine was performed following the standard protocol without intravenous contrast. Multiplanar CT image reconstructions of the cervical spine were also generated. COMPARISON:  CT of the head performed 09/28/2014 FINDINGS: CT HEAD FINDINGS There is no evidence of acute infarction, mass lesion, or intra- or extra-axial hemorrhage on CT. Prominence of ventricles and sulci reflects moderate cortical volume loss. Mild cerebellar atrophy is noted. Scattered periventricular and subcortical white matter change likely reflects small vessel ischemic microangiopathy. A chronic infarct is noted at the medial left occipital lobe, with associated encephalomalacia. The brainstem and fourth ventricle are within normal limits. The basal ganglia are unremarkable in appearance. No mass effect or midline shift is seen. There is no evidence of fracture; visualized osseous structures are unremarkable in appearance. The orbits are  within normal limits. There is mild partial opacification of the right mastoid air cells. The paranasal sinuses and left mastoid air cells are well-aerated. No significant soft tissue abnormalities are seen. CT CERVICAL SPINE FINDINGS There is no evidence of acute fracture or subluxation. Vertebral bodies demonstrate normal height. There is minimal grade 1 anterolisthesis of C3 on C4, and minimal grade 1 anterolisthesis of C6 on C7. Multilevel disc space narrowing is noted along the mid cervical spine, with anterior and  posterior disc osteophyte complexes. Underlying facet disease is noted along the cervical spine. Prevertebral soft tissues are within normal limits. A 4.2 cm hypodensity is suggested at the left thyroid lobe. Mild interstitial prominence is noted at the lung apices. Scattered calcification is seen at the carotid bifurcations bilaterally. IMPRESSION: 1. No evidence of traumatic intracranial injury or fracture. 2. No evidence of acute fracture or subluxation along the cervical spine. 3. Moderate cortical volume loss and scattered small vessel ischemic microangiopathy. 4. 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 5. Degenerative change along the cervical spine. 6. 4.2 cm hypodensity suggested at the left thyroid lobe. Consider further evaluation with thyroid ultrasound, as deemed clinically appropriate. If patient is clinically hyperthyroid, consider nuclear medicine thyroid uptake and scan. 7. Mild interstitial prominence at the lung apices. 8. Scattered calcification at the carotid bifurcations bilaterally. Carotid ultrasound would be helpful for further evaluation, when and as deemed clinically appropriate. 9. Mild partial opacification of the right mastoid air cells. Electronically Signed   By: Roanna Raider M.D.   On: 05/20/2016 02:18   Ct Abdomen Pelvis W Contrast  05/20/2016  CLINICAL DATA:  82 year old male with altered mental status syncope. Patient complaining of abdominal pain. EXAM: CT ABDOMEN AND PELVIS WITH CONTRAST TECHNIQUE: Multidetector CT imaging of the abdomen and pelvis was performed using the standard protocol following bolus administration of intravenous contrast. CONTRAST:  ISOVUE-300 IOPAMIDOL (ISOVUE-300) INJECTION 61% COMPARISON:  Abdominal CT dated 03/22/2016 FINDINGS: Evaluation is limited due to streak artifact caused by patient's arms. Bibasilar linear atelectasis/ scarring. The visualized lung bases are otherwise clear. Mild  cardiomegaly. Calcified mitral annulus. Coronary vascular calcification and CABG clips. No intra-abdominal free air or free fluid. Cholecystectomy. The liver, pancreas, spleen, adrenal glands appear unremarkable. There is a right ureteral pigtail stent with proximal tip in the right renal pelvis and distal end within the urinary bladder. There is mild fullness of the right renal pelvis with parent urothelial enhancement. Correlation with urinalysis recommended to exclude UTI. There is excreted contrast versus a 4 mm nonobstructing right renal upper pole calculus. A 3 mm nonobstructing left renal interpolar calculus noted. A 1.8 cm left renal interpolar hypodense lesion is not well characterized but likely represents a cyst. There is no hydronephrosis on the left. There is slight heterogeneous enhancement of the left kidney. Correlation with urinalysis recommended. The left ureter appears unremarkable. The urinary bladder is decompressed around a Foley catheter. There is sigmoid diverticulosis without active inflammatory changes. There is no evidence of bowel obstruction. Fluid within the distal esophagus likely related to gastroesophageal reflux. There is apparent diffuse thickening of the wall of the stomach likely related to underdistention. Gastritis is less likely but not excluded. Clinical correlation is recommended. Normal appendix. There is advanced aortoiliac atherosclerotic disease. The IVC appears unremarkable. No portal venous gas identified. There is no adenopathy. There is a small fat containing left inguinal hernia. Small fat containing umbilical hernia is also noted. The abdominal wall soft tissues are otherwise  unremarkable. There is osteopenia with multilevel degenerative changes of the spine. Lower lumbar laminectomy and L4-L5 disc spacer and posterior fusion hardware noted. No acute fracture. IMPRESSION: Colonic diverticulosis. No evidence of bowel obstruction. Normal appendix. Under distention of  the stomach versus less likely gastritis. Clinical correlation is recommended. Interval placement of a right sided ureteral stent. There is mild fullness of the right renal pelvis with urothelial enhancement. Correlation with urinalysis recommended to exclude UTI. Excreted intravenous contrast versus a nonobstructing 4 mm right renal upper pole calculus. Electronically Signed   By: Elgie Collard M.D.   On: 05/20/2016 02:21   Dg Chest Port 1 View  06/13/2016  CLINICAL DATA:  Shortness of breath and wheezing. EXAM: PORTABLE CHEST 1 VIEW COMPARISON:  Chest radiographs 06/10/2016 FINDINGS: Tip of the right upper extremity PICC in the SVC. Cardiomegaly is again seen. There is tortuosity and atherosclerosis of the thoracic aorta. Increasing hazy opacity throughout both lungs primarily at the lung bases, right greater than left, suspicious for increasing pleural effusions. There is vascular congestion and peribronchial cuffing, likely pulmonary edema. No pneumothorax per IMPRESSION: Increasing pleural effusions with developing vascular congestion and pulmonary edema, most consistent with CHF. Electronically Signed   By: Rubye Oaks M.D.   On: 06/13/2016 19:52   Dg Chest Port 1 View  06/10/2016  CLINICAL DATA:  Confusion with pain EXAM: PORTABLE CHEST 1 VIEW COMPARISON:  May 22, 2016 FINDINGS: There is generalized cardiomegaly with pulmonary vascularity within normal limits. There is slight bibasilar atelectasis. There is no edema or consolidation. No adenopathy. There is atherosclerotic calcification in the aorta. The patient is status post coronary artery bypass grafting. Central catheter tip is in the superior vena cava. No pneumothorax. There is degenerative change in each shoulder. IMPRESSION: Cardiomegaly. Mild bibasilar atelectasis. No airspace consolidation. There is aortic atherosclerosis. Central catheter tip in superior vena cava. Electronically Signed   By: Bretta Bang III M.D.   On:  06/10/2016 12:44   Dg Chest Port 1 View  05/20/2016  CLINICAL DATA:  82 year old male with shortness of breath EXAM: PORTABLE CHEST 1 VIEW COMPARISON:  Chest radiograph dated 03/22/2016 FINDINGS: Single portable view of the chest demonstrates mild cardiomegaly with increased central vascular prominence compatible with mild congestive changes. There is no focal consolidation, pleural effusion, or pneumothorax. Median sternotomy wires and CABG vascular clips. No acute osseous pathology. IMPRESSION: Mild cardiomegaly with mild congestion.  No focal consolidation. Electronically Signed   By: Elgie Collard M.D.   On: 05/20/2016 00:37   Dg Chest Port 1v Same Day  05/20/2016  CLINICAL DATA:  82 year old male with central line placement. EXAM: PORTABLE CHEST 1 VIEW COMPARISON:  Chest radiograph dated 05/20/2016 FINDINGS: There has been interval placement of a right IJ central venous catheter with tip over central SVC. No pneumothorax. There is cardiomegaly with mild congestive changes and mild bronchiectasis. No focal consolidation, pleural effusion, or pneumothorax. The mass now male wires and CABG vascular clips. No acute osseous pathology. IMPRESSION: Right IJ central line with tip over central SVC.  No pneumothorax. Electronically Signed   By: Elgie Collard M.D.   On: 05/20/2016 04:39   Dg Hip Port Unilat With Pelvis 1v Right  05/23/2016  CLINICAL DATA:  Hypersensitive to touch right lower extremity. Denies recent injury or fall. Right hip pain with difficulty straightening length. EXAM: DG HIP (WITH OR WITHOUT PELVIS) 1V PORT RIGHT COMPARISON:  CT 05/20/2016 FINDINGS: There mild symmetric degenerative changes of the hips. There is no acute  fracture or dislocation. Fusion hardware intact over the lumbosacral spine. Partially visualized right internal ureteral stent which appears in adequate position. Small caliber rectal catheter present. IMPRESSION: No acute findings. Mild symmetric degenerative change  of the hips. Electronically Signed   By: Elberta Fortis M.D.   On: 05/23/2016 13:51     Subjective: No complaints this AM  Discharge Exam: Filed Vitals:   06/15/16 0658 06/15/16 1421  BP: 112/75 109/66  Pulse: 99 87  Temp: 98 F (36.7 C) 98.1 F (36.7 C)  Resp: 18 18   Filed Vitals:   06/14/16 1346 06/14/16 2100 06/15/16 0658 06/15/16 1421  BP:  115/49 112/75 109/66  Pulse:  86 99 87  Temp:  98.4 F (36.9 C) 98 F (36.7 C) 98.1 F (36.7 C)  TempSrc:  Axillary Axillary   Resp:  20 18 18   Height:      Weight:      SpO2: 97% 100% 97% 97%    General: Pt is alert, awake, not in acute distress Cardiovascular: RRR, S1/S2 +, no rubs, no gallops Respiratory: CTA bilaterally, no wheezing, no rhonchi Abdominal: Soft, NT, ND, bowel sounds + Extremities: no edema, no cyanosis    The results of significant diagnostics from this hospitalization (including imaging, microbiology, ancillary and laboratory) are listed below for reference.     Microbiology: Recent Results (from the past 240 hour(s))  Urine culture     Status: Abnormal   Collection Time: 06/10/16 12:11 PM  Result Value Ref Range Status   Specimen Description URINE, CLEAN CATCH  Final   Special Requests NONE  Final   Culture >=100,000 COLONIES/mL YEAST (A)  Final   Report Status 06/14/2016 FINAL  Final  MRSA PCR Screening     Status: None   Collection Time: 06/10/16  6:42 PM  Result Value Ref Range Status   MRSA by PCR NEGATIVE NEGATIVE Final    Comment:        The GeneXpert MRSA Assay (FDA approved for NASAL specimens only), is one component of a comprehensive MRSA colonization surveillance program. It is not intended to diagnose MRSA infection nor to guide or monitor treatment for MRSA infections.   Culture, blood (routine x 2)     Status: None   Collection Time: 06/11/16  3:41 PM  Result Value Ref Range Status   Specimen Description BLOOD LEFT WRIST  Final   Special Requests BOTTLES DRAWN AEROBIC  AND ANAEROBIC 8CC EACH  Final   Culture NO GROWTH 5 DAYS  Final   Report Status 06/16/2016 FINAL  Final  Culture, blood (routine x 2)     Status: Abnormal (Preliminary result)   Collection Time: 06/11/16  3:44 PM  Result Value Ref Range Status   Specimen Description BLOOD PICC LINE DRAWN BY RN  Final   Special Requests BOTTLES DRAWN AEROBIC AND ANAEROBIC 6CC EACH  Final   Culture  Setup Time   Final    YEAST AEROBIC BOTTLE ONLY Gram Stain Report Called to,Read Back By and Verified With: KNIGHT,C. AT 1430 ON 06/14/2016 BY BAUGHAM,M. Performed at [facility] CRITICAL RESULT CALLED TO, READ BACK BY AND VERIFIED WITHArliss Journey RN 1809 06/14/16 A BROWNING Performed at [facility]    Culture CANDIDA GLABRATA (A)  Final   Report Status PENDING  Incomplete  Blood Culture ID Panel (Reflexed)     Status: Abnormal   Collection Time: 06/11/16  3:44 PM  Result Value Ref Range Status   Enterococcus species NOT  DETECTED NOT DETECTED Final   Vancomycin resistance NOT DETECTED NOT DETECTED Final   Listeria monocytogenes NOT DETECTED NOT DETECTED Final   Staphylococcus species NOT DETECTED NOT DETECTED Final   Staphylococcus aureus NOT DETECTED NOT DETECTED Final   Methicillin resistance NOT DETECTED NOT DETECTED Final   Streptococcus species NOT DETECTED NOT DETECTED Final   Streptococcus agalactiae NOT DETECTED NOT DETECTED Final   Streptococcus pneumoniae NOT DETECTED NOT DETECTED Final   Streptococcus pyogenes NOT DETECTED NOT DETECTED Final   Acinetobacter baumannii NOT DETECTED NOT DETECTED Final   Enterobacteriaceae species NOT DETECTED NOT DETECTED Final   Enterobacter cloacae complex NOT DETECTED NOT DETECTED Final   Escherichia coli NOT DETECTED NOT DETECTED Final   Klebsiella oxytoca NOT DETECTED NOT DETECTED Final   Klebsiella pneumoniae NOT DETECTED NOT DETECTED Final   Proteus species NOT DETECTED NOT DETECTED Final   Serratia marcescens NOT DETECTED NOT DETECTED Final    Carbapenem resistance NOT DETECTED NOT DETECTED Final   Haemophilus influenzae NOT DETECTED NOT DETECTED Final   Neisseria meningitidis NOT DETECTED NOT DETECTED Final   Pseudomonas aeruginosa NOT DETECTED NOT DETECTED Final   Candida albicans NOT DETECTED NOT DETECTED Final   Candida glabrata DETECTED (A) NOT DETECTED Final    Comment: CRITICAL RESULT CALLED TO, READ BACK BY AND VERIFIED WITH: C KNIGHT RN 1809 06/14/16 A BROWNING    Candida krusei NOT DETECTED NOT DETECTED Final   Candida parapsilosis NOT DETECTED NOT DETECTED Final   Candida tropicalis NOT DETECTED NOT DETECTED Final    Comment: Performed at [facility]     Labs: BNP (last 3 results)  Recent Labs  03/22/16 0540 05/20/16 0022 05/22/16 0421  BNP 588.0* 709.0* 491.0*   Basic Metabolic Panel:  Recent Labs Lab 06/10/16 1159 06/11/16 0618 06/12/16 0656 06/13/16 0654 06/14/16 0520  NA 133* 137 138 141 140  K 4.0 3.8 3.8 4.0 3.3*  CL 103 107 110 112* 108  CO2 23 22 21* 21* 24  GLUCOSE 237* 96 113* 107* 136*  BUN 24* 30* 28* 24* 22*  CREATININE 1.23 1.39* 1.35* 1.21 1.21  CALCIUM 7.5* 7.4* 7.5* 7.8* 7.6*   Liver Function Tests:  Recent Labs Lab 06/10/16 1159  AST 30  ALT 8*  ALKPHOS 77  BILITOT 0.5  PROT 5.4*  ALBUMIN 2.2*    Recent Labs Lab 06/10/16 1159  LIPASE 21   No results for input(s): AMMONIA in the last 168 hours. CBC:  Recent Labs Lab 06/10/16 1159 06/11/16 0618 06/12/16 0656 06/13/16 0654 06/14/16 0520  WBC 20.1* 20.0* 13.1* 13.5* 11.9*  NEUTROABS 16.8*  --   --   --   --   HGB 9.3* 7.9* 7.4* 7.8* 8.5*  HCT 29.2* 23.9* 23.1* 24.8* 26.7*  MCV 98.3 98.4 100.4* 100.0 99.3  PLT 317 253 210 238 246   Cardiac Enzymes:  Recent Labs Lab 06/10/16 1159 06/10/16 1849 06/11/16 0030 06/11/16 0618  TROPONINI 0.14* 0.14* 0.15* 0.15*   BNP: Invalid input(s): POCBNP CBG:  Recent Labs Lab 06/15/16 0035 06/15/16 0334 06/15/16 0745 06/15/16 1139 06/15/16 1640   GLUCAP 142* 133* 130* 104* 117*   D-Dimer No results for input(s): DDIMER in the last 72 hours. Hgb A1c No results for input(s): HGBA1C in the last 72 hours. Lipid Profile No results for input(s): CHOL, HDL, LDLCALC, TRIG, CHOLHDL, LDLDIRECT in the last 72 hours. Thyroid function studies No results for input(s): TSH, T4TOTAL, T3FREE, THYROIDAB in the last 72 hours.  Invalid input(s): FREET3  Anemia work up No results for input(s): VITAMINB12, FOLATE, FERRITIN, TIBC, IRON, RETICCTPCT in the last 72 hours. Urinalysis    Component Value Date/Time   COLORURINE YELLOW 06/10/2016 1211   APPEARANCEUR HAZY* 06/10/2016 1211   LABSPEC 1.025 06/10/2016 1211   PHURINE 5.5 06/10/2016 1211   GLUCOSEU NEGATIVE 06/10/2016 1211   HGBUR LARGE* 06/10/2016 1211   BILIRUBINUR NEGATIVE 06/10/2016 1211   KETONESUR TRACE* 06/10/2016 1211   PROTEINUR 30* 06/10/2016 1211   UROBILINOGEN 0.2 01/29/2012 2204   NITRITE NEGATIVE 06/10/2016 1211   LEUKOCYTESUR MODERATE* 06/10/2016 1211   Sepsis Labs Invalid input(s): PROCALCITONIN,  WBC,  LACTICIDVEN Microbiology Recent Results (from the past 240 hour(s))  Urine culture     Status: Abnormal   Collection Time: 06/10/16 12:11 PM  Result Value Ref Range Status   Specimen Description URINE, CLEAN CATCH  Final   Special Requests NONE  Final   Culture >=100,000 COLONIES/mL YEAST (A)  Final   Report Status 06/14/2016 FINAL  Final  MRSA PCR Screening     Status: None   Collection Time: 06/10/16  6:42 PM  Result Value Ref Range Status   MRSA by PCR NEGATIVE NEGATIVE Final    Comment:        The GeneXpert MRSA Assay (FDA approved for NASAL specimens only), is one component of a comprehensive MRSA colonization surveillance program. It is not intended to diagnose MRSA infection nor to guide or monitor treatment for MRSA infections.   Culture, blood (routine x 2)     Status: None   Collection Time: 06/11/16  3:41 PM  Result Value Ref Range Status    Specimen Description BLOOD LEFT WRIST  Final   Special Requests BOTTLES DRAWN AEROBIC AND ANAEROBIC 8CC EACH  Final   Culture NO GROWTH 5 DAYS  Final   Report Status 06/16/2016 FINAL  Final  Culture, blood (routine x 2)     Status: Abnormal (Preliminary result)   Collection Time: 06/11/16  3:44 PM  Result Value Ref Range Status   Specimen Description BLOOD PICC LINE DRAWN BY RN  Final   Special Requests BOTTLES DRAWN AEROBIC AND ANAEROBIC 6CC EACH  Final   Culture  Setup Time   Final    YEAST AEROBIC BOTTLE ONLY Gram Stain Report Called to,Read Back By and Verified With: KNIGHT,C. AT 1430 ON 06/14/2016 BY BAUGHAM,M. Performed at [facility] CRITICAL RESULT CALLED TO, READ BACK BY AND VERIFIED WITHArliss Journey RN 1809 06/14/16 A BROWNING Performed at [facility]    Culture CANDIDA GLABRATA (A)  Final   Report Status PENDING  Incomplete  Blood Culture ID Panel (Reflexed)     Status: Abnormal   Collection Time: 06/11/16  3:44 PM  Result Value Ref Range Status   Enterococcus species NOT DETECTED NOT DETECTED Final   Vancomycin resistance NOT DETECTED NOT DETECTED Final   Listeria monocytogenes NOT DETECTED NOT DETECTED Final   Staphylococcus species NOT DETECTED NOT DETECTED Final   Staphylococcus aureus NOT DETECTED NOT DETECTED Final   Methicillin resistance NOT DETECTED NOT DETECTED Final   Streptococcus species NOT DETECTED NOT DETECTED Final   Streptococcus agalactiae NOT DETECTED NOT DETECTED Final   Streptococcus pneumoniae NOT DETECTED NOT DETECTED Final   Streptococcus pyogenes NOT DETECTED NOT DETECTED Final   Acinetobacter baumannii NOT DETECTED NOT DETECTED Final   Enterobacteriaceae species NOT DETECTED NOT DETECTED Final   Enterobacter cloacae complex NOT DETECTED NOT DETECTED Final   Escherichia coli NOT DETECTED NOT DETECTED Final  Klebsiella oxytoca NOT DETECTED NOT DETECTED Final   Klebsiella pneumoniae NOT DETECTED NOT DETECTED Final   Proteus species  NOT DETECTED NOT DETECTED Final   Serratia marcescens NOT DETECTED NOT DETECTED Final   Carbapenem resistance NOT DETECTED NOT DETECTED Final   Haemophilus influenzae NOT DETECTED NOT DETECTED Final   Neisseria meningitidis NOT DETECTED NOT DETECTED Final   Pseudomonas aeruginosa NOT DETECTED NOT DETECTED Final   Candida albicans NOT DETECTED NOT DETECTED Final   Candida glabrata DETECTED (A) NOT DETECTED Final    Comment: CRITICAL RESULT CALLED TO, READ BACK BY AND VERIFIED WITH: C KNIGHT RN 1809 06/14/16 A BROWNING    Candida krusei NOT DETECTED NOT DETECTED Final   Candida parapsilosis NOT DETECTED NOT DETECTED Final   Candida tropicalis NOT DETECTED NOT DETECTED Final    Comment: [facility]     Ayanah Snader, Scheryl MartenSTEPHEN K, MD  [facility] 06/16/2016, 2:13 PM  If 7PM-7AM, please contact night-coverage [email or web address] Password [email or web address]

## 2016-06-16 NOTE — Care Management Note (Signed)
Case Management Note  Patient Details  Name: Stormy CardGeorge W Werst MRN: [id number] Date of Birth: May 17, 1918   Expected Discharge Date:     06/16/2016           Expected Discharge Plan:  Hospice Medical Facility  In-House Referral:  Clinical Social Work  Discharge planning Services  CM Consult  Post Acute Care Choice:  Hospice Choice offered to:  Adult Children  DME Arranged:    DME Agency:     HH Arranged:    HH Agency:     Status of Service:  Completed, signed off  If discussed at Long Length of Stay Meetings, dates discussed:    Additional Comments: Patient discharging today to hospice facility. Arrangments made.   Doniesha Landau, Chrystine OilerSharley Diane, RN 06/16/2016, 2:17 PM

## 2016-06-16 NOTE — Progress Notes (Addendum)
Patient was stable at discharge. Attempted to call Hospice for report. No telephone answer. Will attempt again. [facility]

## 2016-06-16 NOTE — Clinical Social Work Note (Signed)
Patient is from [place].  Family met with Kyle Axe, NP, Palliative Care, and made the decision for patient to have end of life care at [facility].   Referral was made by Nurse Casemanager. Patient to be transferred to [facility] momentarily.    CSW signing off.    [name], Clydene Pugh, LCSW

## 2016-06-16 NOTE — Progress Notes (Signed)
Pt has transitioned to Comfort Care. No need for supplementation at this time. Diet had been advanced for pleasure feedings.   No nutrition interventions warranted at this time. If nutrition issues arise, please consult RD.   Christophe LouisNathan Franks RD, LDN, CNSC Clinical Nutrition Pager: [phone] 06/16/2016 7:58 AM

## 2016-06-16 NOTE — Clinical Social Work Note (Deleted)
Patient Information    Patient Name Sex DOB   [name], [name] ([id number]) Male 02-Apr-1918     Room Bed                              SSN   [id number]                    [phone]    Patient Demographics    Address Phone   [address] [place] [address] [phone] (Home)    Patient Ethnicity & Race    Ethnic Group Patient Race   Not Hispanic or Latino White or Caucasian    Emergency Contact(s)    Name Relation Home Work Mobile   [place] Daughter [phone]     Benjaman Lobe [phone]     Scarlette Ar   [phone]    Documents on File      Status Date Received Description   Documents for the Patient   EMR Medication Summary Not Received     EMR Problem Summary Not Received     EMR Patient Summary Not Received     Driver's License Not Received     Historic Radiology Documentation Not Received     Historic Radiology Documentation Not Received     [place] HIPAA NOTICE OF PRIVACY - Scanned Received 11/10/11    [place] E-Signature HIPAA Notice of Privacy Unable to Obtain 10/02/15    [place] E-Signature HIPAA Notice of Privacy Spanish Not Received     Insurance Card Received 03/29/13    Advance Directives/Living Will/HCPOA/POA Not Received     Financial Application Not Received     Insurance Card Not Received     [place] HIPAA NOTICE OF PRIVACY - Scanned Not Received     Insurance Card Not Received     [place] HIPAA NOTICE OF PRIVACY - Scanned Not Received     AMB Intake Forms/Questionnaires Not Received     AMB Correspondence Not Received  01/13 [facility] Neuro [employer] Card Not Received     [facility] HIPAA NOTICE OF PRIVACY - Scanned Received 08/13/13 RCGD   Release of Information Received 08/13/13 DPR - RCGD   Insurance Card Received 02/10/15 RCGD   HIM ROI Authorization Not Received  RCGD--08/13/13 prog note to PCP (hall)   AMB Correspondence Not Received   12/12 office note [facility]   AMB HH/NH/Hospice Not Received  10/14 phys order Gaspar Cola   HIM ROI Authorization Not Received  RCGD--10/10/13 prog note to PCP (hall)   HIM ROI Authorization Not Received  RCGD--09/12/13 EGD op note & pathology result letter to PCP (hall)   AMB Correspondence Not Received  11/14 [facility] FOR [facility]   HIM ROI Authorization  08/11/14    HIM ROI Authorization  11/08/14 Authorization for batch ECS / UHC rmf 11/08/14 #2   HIM ROI Authorization  12/13/14 ECS representing [employer]/United [employer] Medicare Risk Adjustment Review.   HIM ROI Authorization  02/12/15 RCGD--02/10/15 prog note to PCP (hall)   Other Photo ID Not Received     HIM ROI Authorization  12/14/15 ER visit - Dec. 2016 for continuity of care.    Release of Information  12/14/15    AMB HH/NH/Hospice  01/20/16 MEDICATION ADMINISTRATION RECORD [facility]   [place] E-Signature HIPAA Notice of Privacy      [place] HIPAA NOTICE  OF PRIVACY - Scanned Received 03/24/16    Advance Directives/Living Will/HCPOA/POA  03/25/16    Advance Directives/Living Will/HCPOA/POA  03/25/16    HIM ROI Authorization  05/02/16 Authorization for batch [employer]/UnitedHealthCare Medicare Risk Adjustment Review 05/02/16 fbg   Documents for the Encounter   AOB (Assignment of Insurance Benefits) Received 06/14/16 unable to obtain   E-signature AOB      MEDICARE RIGHTS Received 06/14/16 unable to obtain   [facility] Rights      Cardiac Monitoring Strip  06/10/16    Medicare Observation  06/14/16     Admission Information    Attending Provider Admitting Provider Admission Type Admission Date/Time   Jerald KiefStephen K Chiu, MD Levie HeritageJacob J Stinson, DO Emergency 06/10/16 1107   Discharge Date Hospital Service Auth/Cert Status Service Area    Internal Medicine Incomplete [place] SERVICE AREA   Unit Room/Bed Admission Status   AP-DEPT 300 A340/A340-01 Admission (Confirmed)          Admission    Complaint   emesis    Hospital Account    Name Acct ID Class Status Primary Coverage   [name], [place] W [id number] Inpatient Open UNITED HEALTHCARE MEDICARE - [facility]        Guarantor Account (for Hospital Account [id number])    Name Relation to Pt Service Area Active? Acct Type   [name], Jayson W Self CHSA Yes Personal/Family   Address Phone       [address] [place], [place] [address] [phone](H)          Coverage Information (for Hospital Account [id number])    F/O Payor/Plan Precert #   [facility] MEDICARE    Subscriber Subscriber #   [name], [place] W [id number]   Address Phone   [address] [place], [place] [id number] [phone]

## 2016-06-16 NOTE — Care Management Note (Signed)
Case Management Note  Patient Details  Name: [name] MRN: [id number] Date of Birth: 08/27/1918  [name], [name] ([id number]) Male 03/13/1918     Room Bed   A340 A340-01    Patient Demographics    Address Phone   [address] [place] [address] [phone] (Home)    Patient Ethnicity & Race    Ethnic Group Patient Race   Not Hispanic or Latino White or Caucasian    Emergency Contact(s)    Name Relation Home Work Mobile   [name] Daughter [phone]     [name] [phone]     [name]   [phone]    Documents on File      Status Date Received Description   Documents for the Patient   EMR Medication Summary Not Received     EMR Problem Summary Not Received     EMR Patient Summary Not Received     Driver's License Not Received     Historic Radiology Documentation Not Received     Historic Radiology Documentation Not Received     [place] HIPAA NOTICE OF PRIVACY - Scanned Received 11/10/11    [place] E-Signature HIPAA Notice of Privacy Unable to Obtain 10/02/15    [place] E-Signature HIPAA Notice of Privacy Spanish Not Received     Insurance Card Received 03/29/13    Advance Directives/Living Will/HCPOA/POA Not Received     Financial Application Not Received     Insurance Card Not Received     [place] HIPAA NOTICE OF PRIVACY - Scanned Not Received     Insurance Card Not Received     [place] HIPAA NOTICE OF PRIVACY - Scanned Not Received     AMB Intake Forms/Questionnaires Not Received     AMB Correspondence Not Received  01/13 [facility] Neuro [employer] Card Not Received     [facility] HIPAA NOTICE OF PRIVACY - Scanned Received 08/13/13 RCGD   Release of Information Received 08/13/13 DPR - RCGD   Insurance Card Received 02/10/15 RCGD   HIM ROI Authorization Not Received  RCGD--08/13/13 prog note to PCP (hall)   AMB Correspondence Not Received  12/12  office note [facility]   AMB HH/NH/Hospice Not Received  10/14 phys order Gaspar Cola   HIM ROI Authorization Not Received  RCGD--10/10/13 prog note to PCP (hall)   HIM ROI Authorization Not Received  RCGD--09/12/13 EGD op note & pathology result letter to PCP (hall)   AMB Correspondence Not Received  11/14 [facility] D   HIM ROI Authorization  08/11/14    HIM ROI Authorization  11/08/14 Authorization for batch ECS / UHC rmf 11/08/14 #2   HIM ROI Authorization  12/13/14 ECS representing [employer]/United Healthcare Medicare Risk Adjustment Review.   HIM ROI Authorization  02/12/15 RCGD--02/10/15 prog note to PCP (hall)   Other Photo ID Not Received     HIM ROI Authorization  12/14/15 ER visit - Dec. 2016 for continuity of care.    Release of Information  12/14/15    AMB HH/NH/Hospice  01/20/16 MEDICATION ADMINISTRATION RECORD [facility]   [place] E-Signature HIPAA Notice of Privacy       HIPAA NOTICE OF PRIVACY - Scanned Received 03/24/16    Advance Directives/Living Will/HCPOA/POA  03/25/16    Advance Directives/Living Will/HCPOA/POA  03/25/16    HIM ROI Authorization  05/02/16 Authorization for batch [employer]/UnitedHealthCare Medicare Risk Adjustment Review 05/02/16 fbg   Documents for the  Encounter   AOB (Assignment of Insurance Benefits) Received 06/14/16 unable to obtain   E-signature AOB      MEDICARE RIGHTS Received 06/14/16 unable to obtain   [facility] Rights      Cardiac Monitoring Strip  06/10/16    Medicare Observation  06/14/16     Admission Information    Attending Provider Admitting Provider Admission Type Admission Date/Time   Kyle KiefStephen K Chiu, MD Kyle HeritageJacob J Stinson, [name] Emergency 06/10/16 1107   Discharge Date Hospital Service Auth/Cert Status Service Area    Internal Medicine Incomplete [place] SERVICE AREA   Unit Room/Bed Admission Status   AP-DEPT 300 A340/A340-01 Admission (Confirmed)          Admission    Complaint   emesis    Hospital Account    Name Acct ID Class Status Primary Coverage   [name] [id number] Inpatient Open UNITED HEALTHCARE MEDICARE - [facility]        Guarantor Account (for Hospital Account [id number])    Name Relation to Pt Service Area Active? Acct Type   [name] [name] CHSA Yes Personal/Family   Address Phone       [address] [place], [place] [address] [phone](H)          Coverage Information (for Hospital Account [id number])    F/O Payor/Plan Precert #   [facility]/UHC MEDICARE    Subscriber Subscriber #   [name] [id number]   Address Phone   [address] [place], [place] [phone]

## 2016-06-18 LAB — CULTURE, BLOOD (ROUTINE X 2)

## 2016-06-28 ENCOUNTER — Ambulatory Visit: Payer: Medicare Other | Admitting: Urology

## 2016-06-28 DEATH — deceased

## 2017-05-01 IMAGING — CR DG CHEST 1V PORT
1 series · 1 of 1 positions shown · non-contrast
Comparison: May 22, 2016

CLINICAL DATA: Confusion with pain

EXAM:
PORTABLE CHEST 1 VIEW

[ap portable]
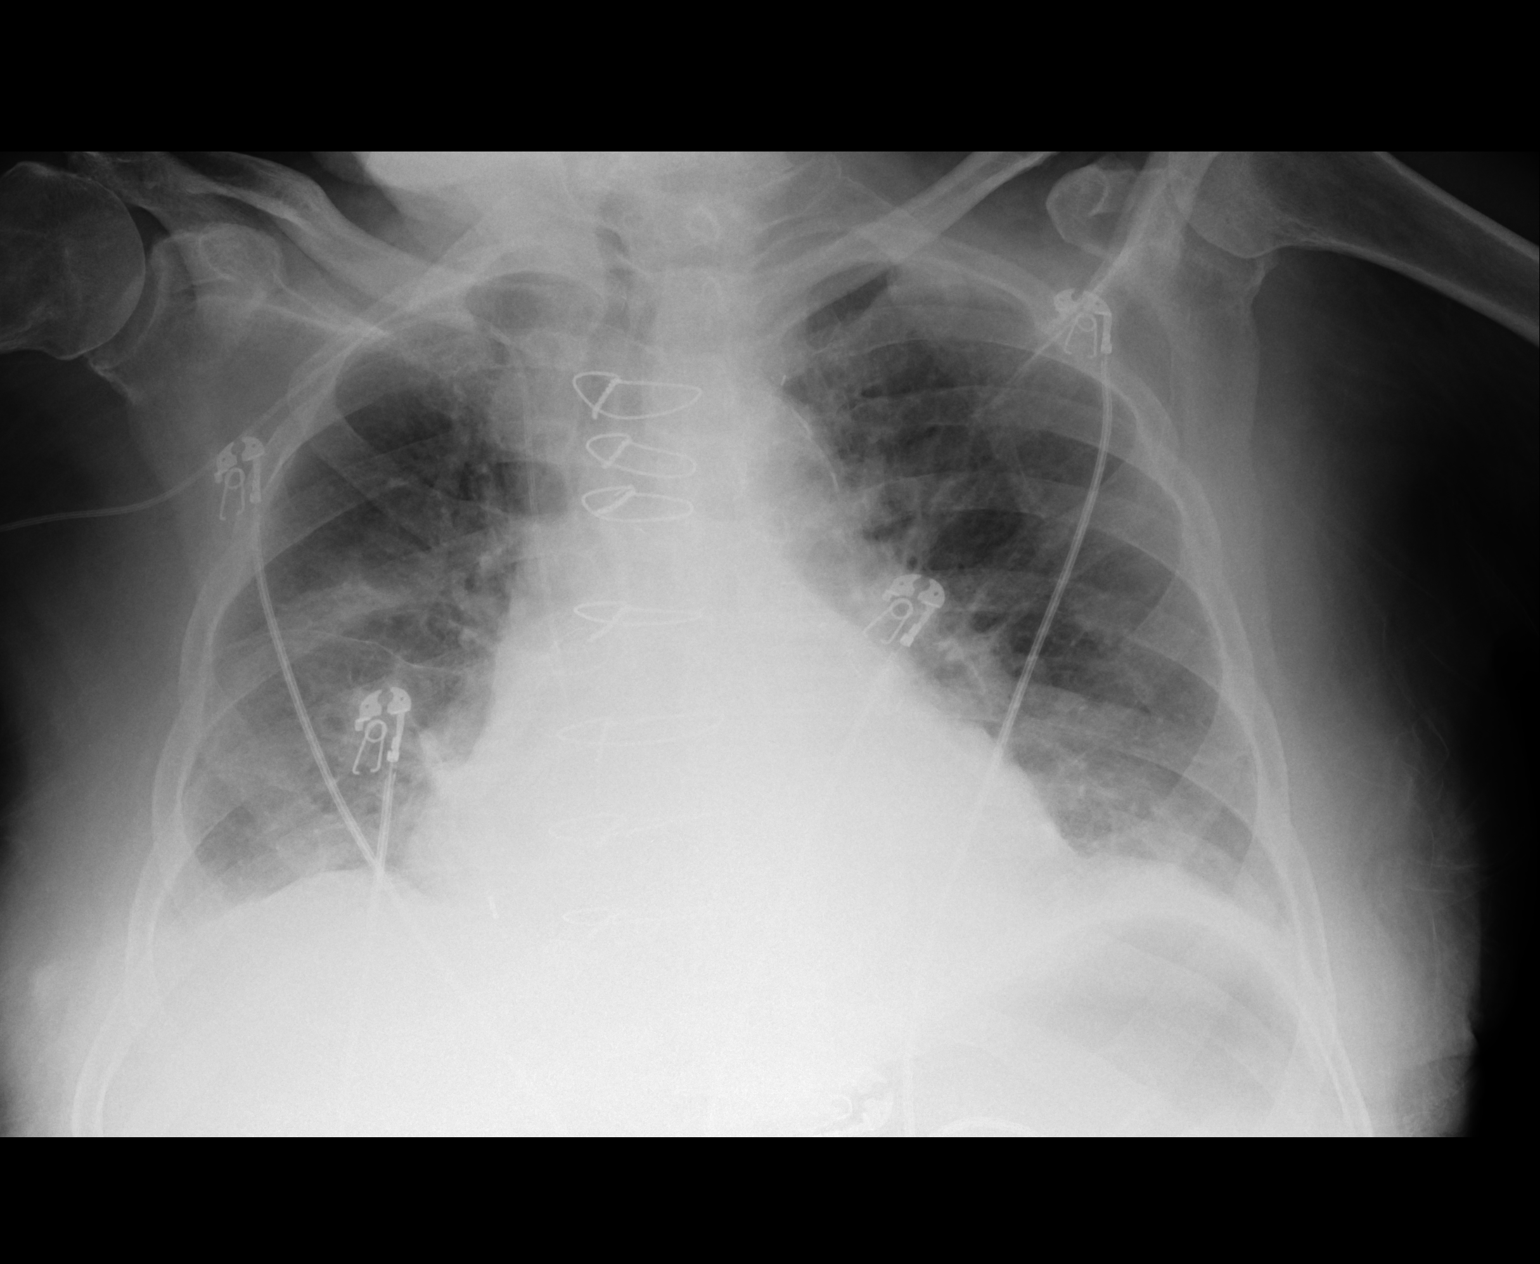

[1 of 1 positions shown; findings below may reference images not displayed]

FINDINGS: There is generalized cardiomegaly with pulmonary vascularity within
normal limits. There is slight bibasilar atelectasis. There is no
edema or consolidation. No adenopathy. There is atherosclerotic
calcification in the aorta. The patient is status post coronary
artery bypass grafting. Central catheter tip is in the superior vena
cava. No pneumothorax. There is degenerative change in each
shoulder.
IMPRESSION: Cardiomegaly. Mild bibasilar atelectasis. No airspace consolidation.
There is aortic atherosclerosis. Central catheter tip in superior
vena cava.

## 2017-05-01 IMAGING — CT CT ABD-PELV W/O CM
2 of 4 series · 15 of 46 positions shown, 17 images · non-contrast
Comparison: 05/20/2016

CLINICAL DATA: Abdominal pain, vomiting starting yesterday

EXAM:
CT ABDOMEN AND PELVIS WITHOUT CONTRAST
TECHNIQUE: Multidetector CT imaging of the abdomen and pelvis was performed
following the standard protocol without IV contrast.

[Series 2: routine abd pel without · axial · non-contrast · 0.82mm/px · z∈[-382,+44]mm · 12 of 101 slices shown, 14 images]
[im 8/101  soft-tissue]
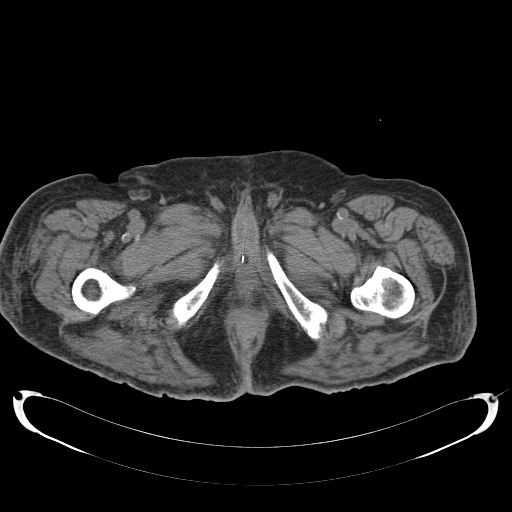
[im 8/101  bone]
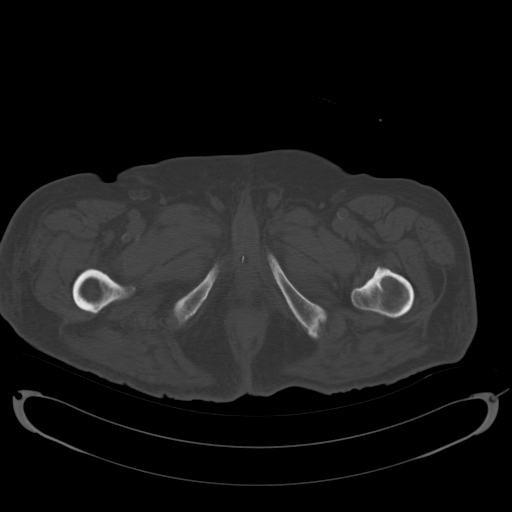
[im 15/101  soft-tissue]
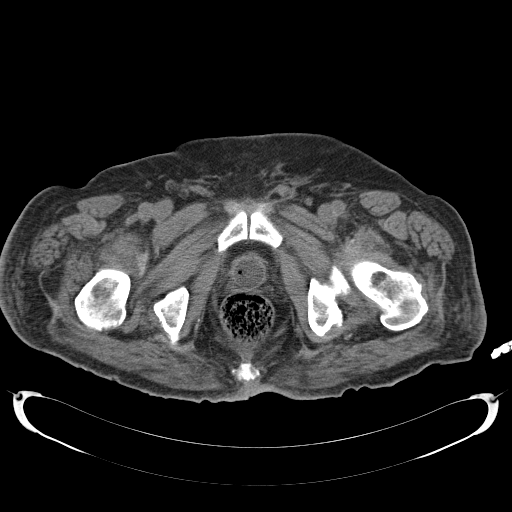
[im 23/101  soft-tissue]
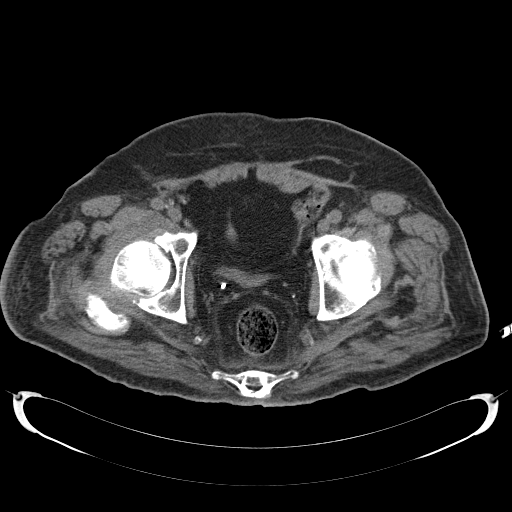
[im 30/101  soft-tissue]
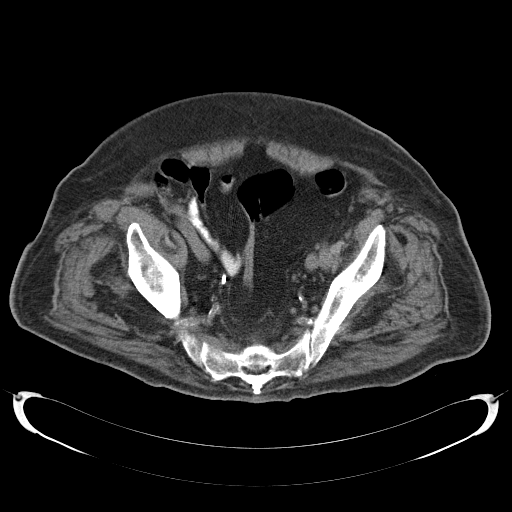
[im 38/101  soft-tissue]
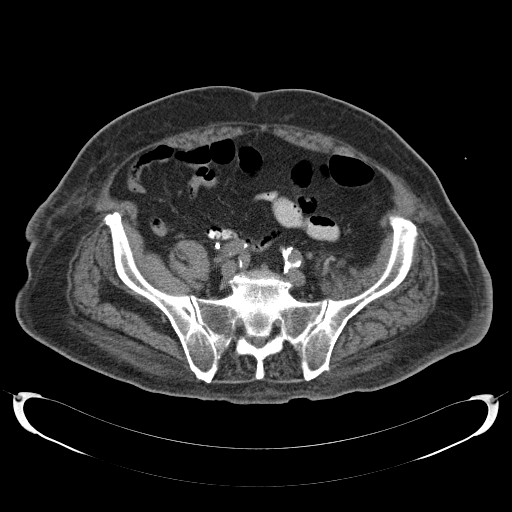
[im 45/101  soft-tissue]
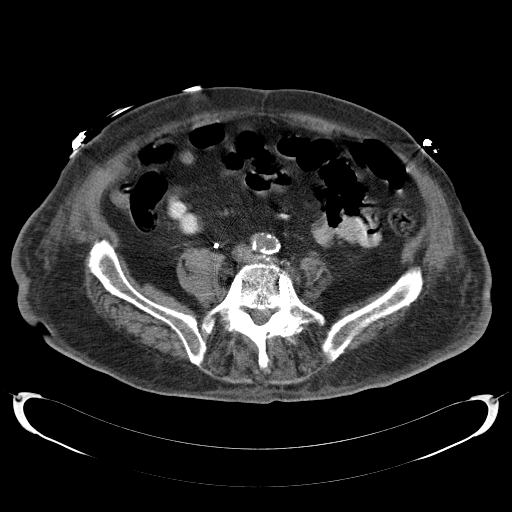
[im 56/101  soft-tissue]
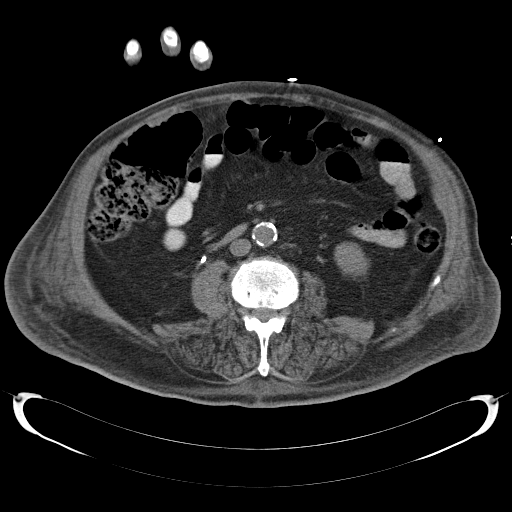
[im 63/101  soft-tissue]
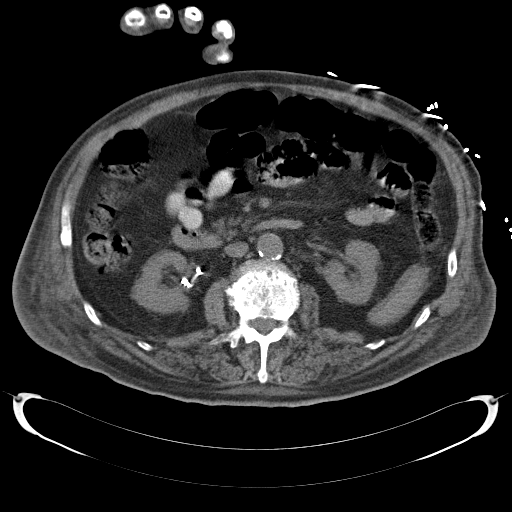
[im 71/101  soft-tissue]
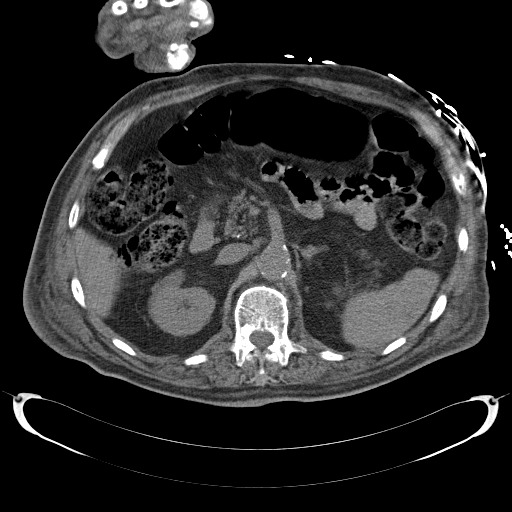
[im 71/101  bone]
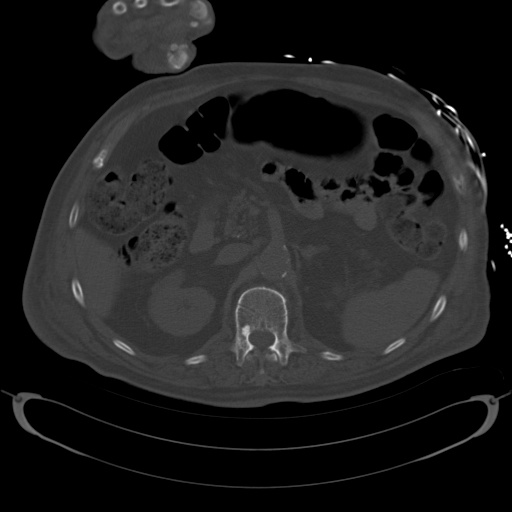
[im 78/101  soft-tissue]
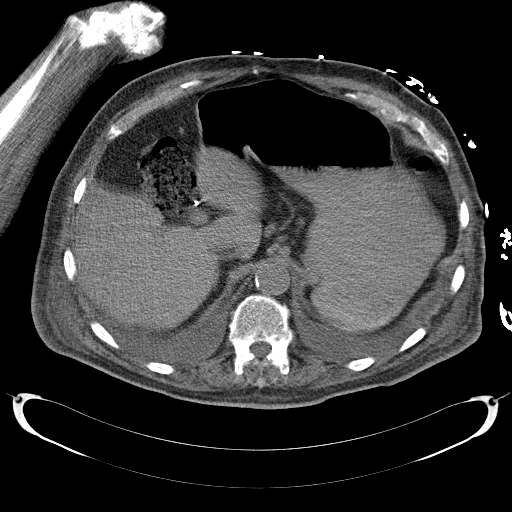
[im 86/101  soft-tissue]
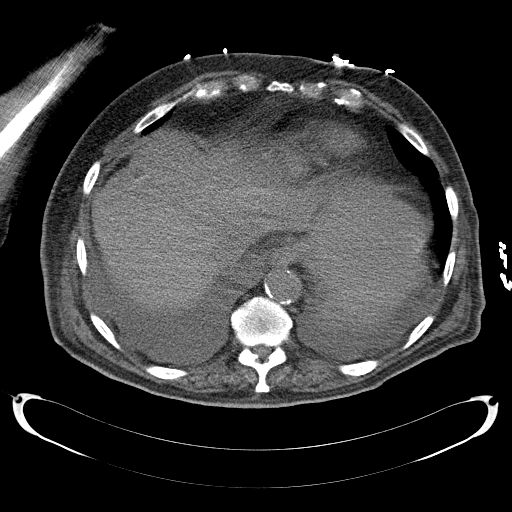
[im 93/101  soft-tissue]
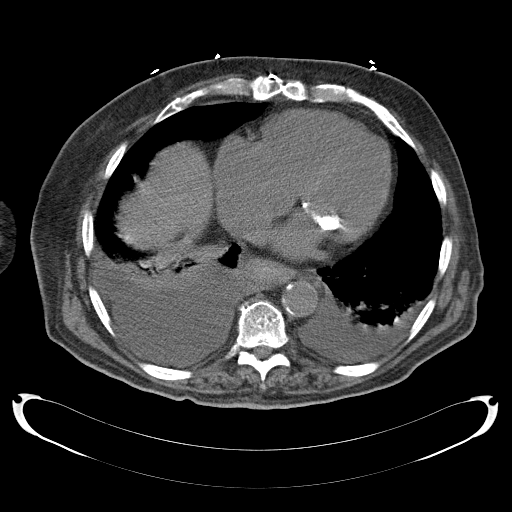

[Series 3: coronal · coronal · 0.82mm/px · 3 of 155 slices shown]
[im 52/155  soft-tissue]
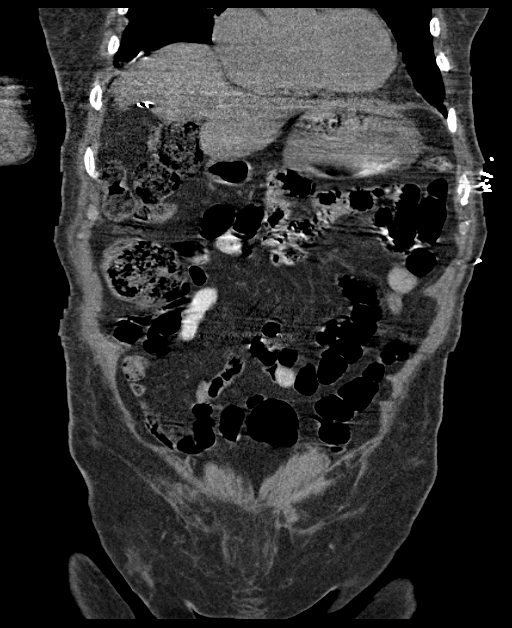
[im 69/155  soft-tissue]
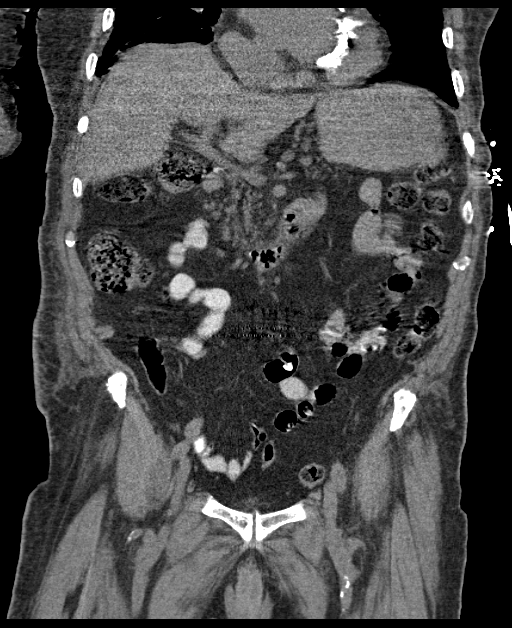
[im 86/155  soft-tissue]
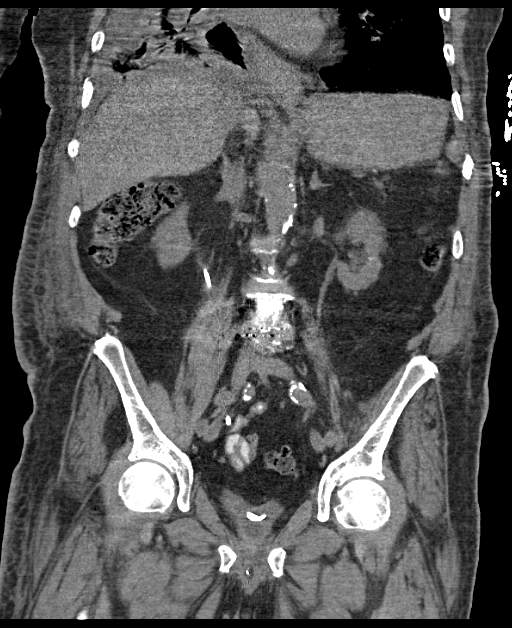

[15 of 46 positions shown; findings below may reference images not displayed]

FINDINGS: Lower chest: There is small left pleural effusion. Small to moderate
right pleural effusion. Bilateral lower lobe posterior atelectasis.
Mitral valve calcifications are noted.

Hepatobiliary: Unenhanced liver shows no biliary ductal dilatation.
Status postcholecystectomy.

Pancreas: Unenhanced pancreas is atrophic

Spleen: Unenhanced spleen is unremarkable.

Adrenals/Urinary Tract: No adrenal gland mass. Unenhanced kidneys
are symmetrical in size. Mild lobulated renal contour. A right
ureteral stent in place. Tiny nonobstructive calculus midpole of the
left kidney measures 2 mm. No hydronephrosis or hydroureter. The
urinary bladder is empty decompressed. There is a Foley catheter
within bladder.

Stomach/Bowel: There is moderate gastric distension with gas and
some fluid highly suspicious for gastric ileus. Moderate gaseous
distension of the small bowel with few air-fluid levels. Some
contrast material noted in mid small bowel loops. Findings
suspicious for diffuse small bowel ileus, early small bowel
obstruction or enteritis. Clinical correlation is necessary. No
transition point in caliber of small bowel is noted.

Abundant stool and some colonic gas noted within cecum. There is no
pericecal inflammation. Normal appendix is partially visualized in
axial image 54. No colonic diverticula are noted proximal sigmoid
colon. No evidence of acute diverticulitis.

Vascular/Lymphatic: Atherosclerotic calcifications of abdominal
aorta and iliac arteries. No aortic aneurysm. No retroperitoneal or
mesenteric adenopathy.

Reproductive: No pelvic mass.  No adenopathy.

Other: No abdominal ascites or free abdominal air. There is a tiny
umbilical hernia containing fat without evidence of acute
complication.

Musculoskeletal: No destructive bony lesions are noted. Degenerative
changes are noted thoracolumbar spine. There is posterior fusion at
L4-L5 level with alignment preserved. Disc space flattening with
vacuum disc phenomenon noted at L1-L2 and L2-L3 level.
IMPRESSION: 1. Small left pleural effusion. Small to moderate right pleural
effusion. Bilateral lower lobe posterior atelectasis.
2. There is moderate gastric distension with gas and fluid. Highly
suspicious for gastroparesis or gastric ileus.
3. Moderate gaseous distended small bowel loops with few air-fluid
levels. Findings suspicious for ileus, early small bowel obstruction
or diffuse enteritis. Clinical correlation is necessary. There is no
transition point in caliber of small bowel.
4. Moderate stool and gas noted within cecum and right colon. No
pericecal inflammation. Normal appendix is partially visualized.
Some colonic gas noted within mid sigmoid colon. No distal colonic
obstruction. Colonic diverticula are noted sigmoid colon without
evidence of acute diverticulitis.
5. A right ureteral stent in place is noted. There is a Foley
catheter with[REDACTED]ompressed urinary bladder.
6. No evidence of free abdominal air.
